# Patient Record
Sex: Male | Born: 1960 | Race: White | Hispanic: No | Marital: Married | Smoking: Current some day smoker
Health system: Southern US, Community
[De-identification: ages and names within clinical notes are randomized; demographics above are authoritative.]

## PROBLEM LIST (undated history)

## (undated) DIAGNOSIS — R911 Solitary pulmonary nodule: Secondary | ICD-10-CM

## (undated) DIAGNOSIS — N201 Calculus of ureter: Secondary | ICD-10-CM

## (undated) DIAGNOSIS — J302 Other seasonal allergic rhinitis: Secondary | ICD-10-CM

## (undated) DIAGNOSIS — K635 Polyp of colon: Secondary | ICD-10-CM

## (undated) DIAGNOSIS — R3912 Poor urinary stream: Secondary | ICD-10-CM

## (undated) DIAGNOSIS — K601 Chronic anal fissure: Secondary | ICD-10-CM

## (undated) DIAGNOSIS — Z87442 Personal history of urinary calculi: Secondary | ICD-10-CM

## (undated) DIAGNOSIS — I4510 Unspecified right bundle-branch block: Secondary | ICD-10-CM

## (undated) DIAGNOSIS — E785 Hyperlipidemia, unspecified: Secondary | ICD-10-CM

## (undated) DIAGNOSIS — Z86010 Personal history of colonic polyps: Secondary | ICD-10-CM

## (undated) DIAGNOSIS — I700 Atherosclerosis of aorta: Secondary | ICD-10-CM

## (undated) DIAGNOSIS — I10 Essential (primary) hypertension: Secondary | ICD-10-CM

## (undated) DIAGNOSIS — K602 Anal fissure, unspecified: Secondary | ICD-10-CM

## (undated) DIAGNOSIS — G4730 Sleep apnea, unspecified: Secondary | ICD-10-CM

## (undated) DIAGNOSIS — K649 Unspecified hemorrhoids: Secondary | ICD-10-CM

## (undated) DIAGNOSIS — R0600 Dyspnea, unspecified: Secondary | ICD-10-CM

## (undated) DIAGNOSIS — N200 Calculus of kidney: Secondary | ICD-10-CM

## (undated) DIAGNOSIS — Z973 Presence of spectacles and contact lenses: Secondary | ICD-10-CM

## (undated) DIAGNOSIS — E559 Vitamin D deficiency, unspecified: Secondary | ICD-10-CM

## (undated) DIAGNOSIS — K219 Gastro-esophageal reflux disease without esophagitis: Secondary | ICD-10-CM

## (undated) DIAGNOSIS — Z860101 Personal history of adenomatous and serrated colon polyps: Secondary | ICD-10-CM

## (undated) DIAGNOSIS — K760 Fatty (change of) liver, not elsewhere classified: Secondary | ICD-10-CM

## (undated) DIAGNOSIS — J432 Centrilobular emphysema: Secondary | ICD-10-CM

## (undated) DIAGNOSIS — T7840XA Allergy, unspecified, initial encounter: Secondary | ICD-10-CM

## (undated) DIAGNOSIS — M1990 Unspecified osteoarthritis, unspecified site: Secondary | ICD-10-CM

## (undated) DIAGNOSIS — G4733 Obstructive sleep apnea (adult) (pediatric): Secondary | ICD-10-CM

## (undated) DIAGNOSIS — Z201 Contact with and (suspected) exposure to tuberculosis: Secondary | ICD-10-CM

## (undated) HISTORY — DX: Essential (primary) hypertension: I10

## (undated) HISTORY — DX: Allergy, unspecified, initial encounter: T78.40XA

## (undated) HISTORY — PX: KNEE SURGERY: SHX244

## (undated) HISTORY — DX: Fatty (change of) liver, not elsewhere classified: K76.0

## (undated) HISTORY — DX: Atherosclerosis of aorta: I70.0

## (undated) HISTORY — DX: Sleep apnea, unspecified: G47.30

## (undated) HISTORY — DX: Vitamin D deficiency, unspecified: E55.9

## (undated) HISTORY — DX: Polyp of colon: K63.5

## (undated) HISTORY — PX: WISDOM TOOTH EXTRACTION: SHX21

## (undated) HISTORY — DX: Unspecified hemorrhoids: K64.9

## (undated) HISTORY — DX: Gastro-esophageal reflux disease without esophagitis: K21.9

## (undated) HISTORY — DX: Unspecified osteoarthritis, unspecified site: M19.90

## (undated) HISTORY — DX: Other seasonal allergic rhinitis: J30.2

## (undated) HISTORY — PX: COLONOSCOPY: SHX174

## (undated) HISTORY — DX: Hyperlipidemia, unspecified: E78.5

## (undated) HISTORY — DX: Anal fissure, unspecified: K60.2

## (undated) HISTORY — PX: POLYPECTOMY: SHX149

---

## 1999-01-31 ENCOUNTER — Encounter: Payer: Self-pay | Admitting: Internal Medicine

## 1999-01-31 ENCOUNTER — Ambulatory Visit (HOSPITAL_COMMUNITY): Admission: RE | Admit: 1999-01-31 | Discharge: 1999-01-31 | Payer: Self-pay | Admitting: Internal Medicine

## 2000-04-22 HISTORY — PX: CYSTO: SHX6284

## 2000-09-24 ENCOUNTER — Encounter: Payer: Self-pay | Admitting: Internal Medicine

## 2000-09-24 ENCOUNTER — Ambulatory Visit (HOSPITAL_COMMUNITY): Admission: RE | Admit: 2000-09-24 | Discharge: 2000-09-24 | Payer: Self-pay | Admitting: Internal Medicine

## 2001-05-08 ENCOUNTER — Ambulatory Visit (HOSPITAL_COMMUNITY): Admission: RE | Admit: 2001-05-08 | Discharge: 2001-05-08 | Payer: Self-pay | Admitting: Internal Medicine

## 2001-05-08 ENCOUNTER — Encounter: Payer: Self-pay | Admitting: Internal Medicine

## 2002-07-29 ENCOUNTER — Ambulatory Visit (HOSPITAL_COMMUNITY): Admission: RE | Admit: 2002-07-29 | Discharge: 2002-07-29 | Payer: Self-pay | Admitting: Internal Medicine

## 2002-07-29 ENCOUNTER — Encounter: Payer: Self-pay | Admitting: Internal Medicine

## 2003-04-04 ENCOUNTER — Ambulatory Visit (HOSPITAL_COMMUNITY): Admission: RE | Admit: 2003-04-04 | Discharge: 2003-04-04 | Payer: Self-pay | Admitting: Infectious Diseases

## 2006-01-02 ENCOUNTER — Ambulatory Visit (HOSPITAL_COMMUNITY): Admission: RE | Admit: 2006-01-02 | Discharge: 2006-01-02 | Payer: Self-pay | Admitting: Internal Medicine

## 2007-02-09 ENCOUNTER — Ambulatory Visit (HOSPITAL_COMMUNITY): Admission: RE | Admit: 2007-02-09 | Discharge: 2007-02-09 | Payer: Self-pay | Admitting: Internal Medicine

## 2007-03-03 ENCOUNTER — Ambulatory Visit (HOSPITAL_COMMUNITY): Admission: RE | Admit: 2007-03-03 | Discharge: 2007-03-03 | Payer: Self-pay | Admitting: Internal Medicine

## 2007-09-14 ENCOUNTER — Emergency Department (HOSPITAL_COMMUNITY): Admission: EM | Admit: 2007-09-14 | Discharge: 2007-09-14 | Payer: Self-pay | Admitting: Family Medicine

## 2007-11-23 ENCOUNTER — Emergency Department (HOSPITAL_COMMUNITY): Admission: EM | Admit: 2007-11-23 | Discharge: 2007-11-23 | Payer: Self-pay | Admitting: Emergency Medicine

## 2008-03-21 ENCOUNTER — Ambulatory Visit (HOSPITAL_COMMUNITY): Admission: RE | Admit: 2008-03-21 | Discharge: 2008-03-21 | Payer: Self-pay | Admitting: Internal Medicine

## 2009-03-29 ENCOUNTER — Ambulatory Visit (HOSPITAL_COMMUNITY): Admission: RE | Admit: 2009-03-29 | Discharge: 2009-03-29 | Payer: Self-pay | Admitting: Internal Medicine

## 2010-04-03 ENCOUNTER — Ambulatory Visit (HOSPITAL_COMMUNITY)
Admission: RE | Admit: 2010-04-03 | Discharge: 2010-04-03 | Payer: Self-pay | Source: Home / Self Care | Attending: Internal Medicine | Admitting: Internal Medicine

## 2011-01-16 LAB — POCT I-STAT, CHEM 8
BUN: 15
Creatinine, Ser: 1
Glucose, Bld: 100 — ABNORMAL HIGH
Hemoglobin: 18.7 — ABNORMAL HIGH
Potassium: 4.3
Sodium: 139
TCO2: 26

## 2011-01-16 LAB — URINE CULTURE: Culture: NO GROWTH

## 2011-01-16 LAB — CBC
MCV: 92
Platelets: 196
RDW: 12.6
WBC: 10.2

## 2011-01-16 LAB — PSA: PSA: 0.62

## 2011-01-16 LAB — POCT URINALYSIS DIP (DEVICE)
Nitrite: NEGATIVE
Urobilinogen, UA: 1

## 2011-01-16 LAB — DIFFERENTIAL
Eosinophils Relative: 2
Lymphocytes Relative: 27
Monocytes Absolute: 0.6
Neutrophils Relative %: 64

## 2011-01-16 LAB — HEPATIC FUNCTION PANEL
AST: 30
Albumin: 4.5

## 2011-04-05 ENCOUNTER — Other Ambulatory Visit (HOSPITAL_COMMUNITY): Payer: Self-pay | Admitting: Internal Medicine

## 2011-04-05 ENCOUNTER — Ambulatory Visit (HOSPITAL_COMMUNITY)
Admission: RE | Admit: 2011-04-05 | Discharge: 2011-04-05 | Disposition: A | Discharge status: Home / Self Care | Payer: 59 | Source: Ambulatory Visit | Attending: Internal Medicine | Admitting: Internal Medicine

## 2011-04-05 DIAGNOSIS — I10 Essential (primary) hypertension: Secondary | ICD-10-CM | POA: Insufficient documentation

## 2011-04-05 DIAGNOSIS — F17200 Nicotine dependence, unspecified, uncomplicated: Secondary | ICD-10-CM | POA: Insufficient documentation

## 2011-04-05 DIAGNOSIS — Z0000 Encounter for general adult medical examination without abnormal findings: Secondary | ICD-10-CM | POA: Insufficient documentation

## 2012-05-12 ENCOUNTER — Ambulatory Visit (HOSPITAL_COMMUNITY)
Admission: RE | Admit: 2012-05-12 | Discharge: 2012-05-12 | Disposition: A | Discharge status: Home / Self Care | Payer: 59 | Source: Ambulatory Visit | Attending: Internal Medicine | Admitting: Internal Medicine

## 2012-05-12 ENCOUNTER — Other Ambulatory Visit (HOSPITAL_COMMUNITY): Payer: Self-pay | Admitting: Internal Medicine

## 2012-05-12 DIAGNOSIS — I10 Essential (primary) hypertension: Secondary | ICD-10-CM

## 2013-05-17 DIAGNOSIS — E559 Vitamin D deficiency, unspecified: Secondary | ICD-10-CM | POA: Insufficient documentation

## 2013-05-17 DIAGNOSIS — E785 Hyperlipidemia, unspecified: Secondary | ICD-10-CM | POA: Insufficient documentation

## 2013-05-18 ENCOUNTER — Encounter: Payer: Self-pay | Admitting: Internal Medicine

## 2013-05-18 ENCOUNTER — Ambulatory Visit (INDEPENDENT_AMBULATORY_CARE_PROVIDER_SITE_OTHER): Payer: 59 | Admitting: Internal Medicine

## 2013-05-18 VITALS — BP 134/84 | HR 84 | Temp 97.9°F | Resp 16 | Ht 69.0 in | Wt 232.0 lb

## 2013-05-18 DIAGNOSIS — E559 Vitamin D deficiency, unspecified: Secondary | ICD-10-CM

## 2013-05-18 DIAGNOSIS — R740 Nonspecific elevation of levels of transaminase and lactic acid dehydrogenase [LDH]: Secondary | ICD-10-CM

## 2013-05-18 DIAGNOSIS — R7309 Other abnormal glucose: Secondary | ICD-10-CM

## 2013-05-18 DIAGNOSIS — Z113 Encounter for screening for infections with a predominantly sexual mode of transmission: Secondary | ICD-10-CM

## 2013-05-18 DIAGNOSIS — R209 Unspecified disturbances of skin sensation: Secondary | ICD-10-CM

## 2013-05-18 DIAGNOSIS — Z1212 Encounter for screening for malignant neoplasm of rectum: Secondary | ICD-10-CM

## 2013-05-18 DIAGNOSIS — R7401 Elevation of levels of liver transaminase levels: Secondary | ICD-10-CM

## 2013-05-18 DIAGNOSIS — Z125 Encounter for screening for malignant neoplasm of prostate: Secondary | ICD-10-CM

## 2013-05-18 DIAGNOSIS — I10 Essential (primary) hypertension: Secondary | ICD-10-CM

## 2013-05-18 DIAGNOSIS — Z0000 Encounter for general adult medical examination without abnormal findings: Secondary | ICD-10-CM

## 2013-05-18 LAB — CBC WITH DIFFERENTIAL/PLATELET
BASOS ABS: 0.1 10*3/uL (ref 0.0–0.1)
Basophils Relative: 1 % (ref 0–1)
Eosinophils Absolute: 0.2 10*3/uL (ref 0.0–0.7)
Eosinophils Relative: 3 % (ref 0–5)
HEMATOCRIT: 48.1 % (ref 39.0–52.0)
Hemoglobin: 17.2 g/dL — ABNORMAL HIGH (ref 13.0–17.0)
LYMPHS PCT: 35 % (ref 12–46)
Lymphs Abs: 3 10*3/uL (ref 0.7–4.0)
MCH: 31.6 pg (ref 26.0–34.0)
MCHC: 35.8 g/dL (ref 30.0–36.0)
MCV: 88.4 fL (ref 78.0–100.0)
Monocytes Absolute: 0.8 10*3/uL (ref 0.1–1.0)
Monocytes Relative: 9 % (ref 3–12)
NEUTROS ABS: 4.5 10*3/uL (ref 1.7–7.7)
NEUTROS PCT: 52 % (ref 43–77)
Platelets: 245 10*3/uL (ref 150–400)
RBC: 5.44 MIL/uL (ref 4.22–5.81)
RDW: 13 % (ref 11.5–15.5)
WBC: 8.5 10*3/uL (ref 4.0–10.5)

## 2013-05-18 LAB — HEMOGLOBIN A1C
HEMOGLOBIN A1C: 5.5 % (ref <5.7)
Mean Plasma Glucose: 111 mg/dL (ref <117)

## 2013-05-18 NOTE — Patient Instructions (Signed)

## 2013-05-18 NOTE — Progress Notes (Signed)
Patient ID: Javier Vega, male   DOB: [birth year], 53 y.o.   MRN: [id number]  Annual Screening Comprehensive Examination  This very nice 53 y.o.  MWM presents for complete physical.  Patient has been followed for HTN, Diabetes  Prediabetes, Hyperlipidemia, and Vitamin D Deficiency. He is RH and reports recent intermittent to constant circumferential numb paresthesias of thew LUE from the elbow to the tips of all fingers and thumb. He deniesany other neuro Sx's.   HTN predates since 2007. Patient's BP has been controlled at home.Today's BP: 134/84 mmHg. Patient denies any cardiac symptoms as chest pain, palpitations, shortness of breath, dizziness or ankle swelling.   Patient's hyperlipidemia is controlled with diet and medications. Patient denies myalgias or other medication SE's. Last cholesterol last visit was 231, triglycerides 246, HDL 38 and LDL 144 and he was prescribed Zetia, but instead he stopped it in July 2014 and opted to try fish oil to see it it would help his cholesterol.   Patient has prediabetes with A1c 5.7% in 2010 with last A1c 5.3% in July 2014. He does have morbid obesity with BMI 35. Patient denies reactive hypoglycemic symptoms, visual blurring, diabetic polys, or paresthesias.    Finally, patient has history of Vitamin D Deficiency of 19 in 2008 with last vitamin D 64 in July 2014.       Medication List       aspirin 81 MG chewable tablet  Chew 81 mg by mouth daily.     FISH OIL PO  Take by mouth daily.     MENS MULTIVITAMIN PLUS Tabs  Take by mouth daily.     VITAMIN B 12 PO  Take by mouth daily.     VITAMIN C PO  Take by mouth daily.     VITAMIN D PO  Take 8,000 Int'l Units by mouth daily.        Allergies  Allergen Reactions  . Crestor [Rosuvastatin]     Past Medical History  Diagnosis Date  . Hyperlipidemia   . Hypertension   . Vitamin D deficiency     Past Surgical History  Procedure Laterality Date  . Carpal tunnel release  2002  .  Cysto  2002    Family History  Problem Relation Age of Onset  . Adopted: Yes    History   Social History  . Marital Status: Married    Spouse Name: N/A    Number of Children: N/A  . Years of Education: N/A   Occupational History  . Not on file.   Social History Main Topics  . Smoking status: Current Every Day Smoker  . Smokeless tobacco: Not on file     Comment: smokes 5 cigarettes daily  . Alcohol Use: Yes     Comment: Occ  . Drug Use: No  . Sexual Activity: Not on file   Other Topics Concern  . Not on file   Social History Narrative  . No narrative on file    ROS Constitutional: Denies fever, chills, weight loss/gain, headaches, insomnia, fatigue, night sweats, and change in appetite. Eyes: Denies redness, blurred vision, diplopia, discharge, itchy, watery eyes.  ENT: Denies discharge, congestion, post nasal drip, epistaxis, sore throat, earache, hearing loss, dental pain, Tinnitus, Vertigo, Sinus pain, snoring.  Cardio: Denies chest pain, palpitations, irregular heartbeat, syncope, dyspnea, diaphoresis, orthopnea, PND, claudication, edema Respiratory: denies cough, dyspnea, DOE, pleurisy, hoarseness, laryngitis, wheezing.  Gastrointestinal: Denies dysphagia, heartburn, reflux, water brash, pain, cramps, nausea, vomiting, bloating, diarrhea, constipation, hematemesis,  melena, hematochezia, jaundice, hemorrhoids Genitourinary: Denies dysuria, frequency, urgency, nocturia, hesitancy, discharge, hematuria, flank pain Musculoskeletal: Denies arthralgia, myalgia, stiffness, Jt. Swelling, pain, limp, and strain/sprain. Skin: Denies puritis, rash, hives, warts, acne, eczema, changing in skin lesion Neuro: No weakness, tremor, incoordination, spasms, paresthesia, pain Psychiatric: Denies confusion, memory loss, sensory loss Endocrine: Denies change in weight, skin, hair change, nocturia, and paresthesia, diabetic polys, visual blurring, hyper / hypo glycemic episodes.   Heme/Lymph: No excessive bleeding, bruising, or elarged lymph nodes.  BP: 134/84  Pulse: 84  Temp: 97.9 F (36.6 C)  Resp: 16    Estimated body mass index is 34.24 kg/(m^2) as calculated from the following:   Height as of this encounter: 5\' 9"  (1.753 m).   Weight as of this encounter: 232 lb (105.235 kg).  Physical Exam General Appearance: Well nourished, in no apparent distress. Eyes: PERRLA, EOMs, conjunctiva no swelling or erythema, normal fundi and vessels. Sinuses: No frontal/maxillary tenderness ENT/Mouth: EACs patent / TMs  nl. Nares clear without erythema, swelling, mucoid exudates. Oral hygiene is good. No erythema, swelling, or exudate. Tongue normal, non-obstructing. Tonsils not swollen or erythematous. Hearing normal.  Neck: Supple, thyroid normal. No bruits, nodes or JVD. Respiratory: Respiratory effort normal.  BS equal and clear bilateral without rales, rhonci, wheezing or stridor. Cardio: Heart sounds are normal with regular rate and rhythm and no murmurs, rubs or gallops. Peripheral pulses are normal and equal bilaterally without edema. No aortic or femoral bruits. Chest: symmetric with normal excursions and percussion.  Abdomen: Flat, soft, with bowl sounds. Nontender, no guarding, rebound, hernias, masses, or organomegaly.  Lymphatics: Non tender without lymphadenopathy.  Genitourinary: No hernias.Testes nl. DRE - prostate nl for age - smooth & firm w/o nodules. Musculoskeletal: Full ROM all peripheral extremities, joint stability, 5/5 strength, and normal gait. Skin: Warm and dry without rashes, lesions, cyanosis, clubbing or  ecchymosis.  Neuro: Cranial nerves intact, reflexes equal bilaterally. Normal muscle tone, no cerebellar symptoms. Sensation intact.  Pysch: Awake and oriented X 3, normal affect, insight and judgment appropriate. Normal ROM of the LUE with sensory intact at present.  Assessment and Plan  1. Annual Screening Examination 2. Hypertension   3. Hyperlipidemia 4. Pre Diabetes 5. Vitamin D Deficiency 6. Query paresthesias of LUE - R/O Rt parietal lobe lesion, etc. Plant CTs Brain   Continue prudent diet as discussed, weight control, BP monitoring, regular exercise, and medications as discussed.  Discussed med effects and SE's. Routine screening labs and tests as requested with regular follow-up as recommended.

## 2013-05-19 LAB — HEPATITIS B SURFACE ANTIBODY,QUALITATIVE: HEP B S AB: NEGATIVE

## 2013-05-19 LAB — URINALYSIS, MICROSCOPIC ONLY
BACTERIA UA: NONE SEEN
CASTS: NONE SEEN
CRYSTALS: NONE SEEN
SQUAMOUS EPITHELIAL / LPF: NONE SEEN

## 2013-05-19 LAB — HEPATIC FUNCTION PANEL
ALBUMIN: 4.6 g/dL (ref 3.5–5.2)
ALT: 28 U/L (ref 0–53)
AST: 26 U/L (ref 0–37)
Alkaline Phosphatase: 76 U/L (ref 39–117)
BILIRUBIN TOTAL: 0.4 mg/dL (ref 0.3–1.2)
Bilirubin, Direct: 0.1 mg/dL (ref 0.0–0.3)
Indirect Bilirubin: 0.3 mg/dL (ref 0.0–0.9)
TOTAL PROTEIN: 7.3 g/dL (ref 6.0–8.3)

## 2013-05-19 LAB — MICROALBUMIN / CREATININE URINE RATIO
Creatinine, Urine: 133.8 mg/dL
MICROALB UR: 0.59 mg/dL (ref 0.00–1.89)
Microalb Creat Ratio: 4.4 mg/g (ref 0.0–30.0)

## 2013-05-19 LAB — HEPATITIS A ANTIBODY, TOTAL: HEP A TOTAL AB: NONREACTIVE

## 2013-05-19 LAB — HIV ANTIBODY (ROUTINE TESTING W REFLEX): HIV: NONREACTIVE

## 2013-05-19 LAB — LIPID PANEL
Cholesterol: 200 mg/dL (ref 0–200)
HDL: 39 mg/dL — ABNORMAL LOW (ref >39)
LDL Cholesterol: 114 mg/dL — ABNORMAL HIGH (ref 0–99)
Total CHOL/HDL Ratio: 5.1 Ratio
Triglycerides: 235 mg/dL — ABNORMAL HIGH (ref <150)
VLDL: 47 mg/dL — AB (ref 0–40)

## 2013-05-19 LAB — PSA: PSA: 0.7 ng/mL (ref <=4.00)

## 2013-05-19 LAB — INSULIN, FASTING: Insulin fasting, serum: 25 u[IU]/mL (ref 3–28)

## 2013-05-19 LAB — HEPATITIS C ANTIBODY: HCV AB: NEGATIVE

## 2013-05-19 LAB — HEPATITIS B CORE ANTIBODY, TOTAL: Hep B Core Total Ab: NONREACTIVE

## 2013-05-19 LAB — VITAMIN B12: Vitamin B-12: 489 pg/mL (ref 211–911)

## 2013-05-19 LAB — RPR

## 2013-05-19 LAB — TESTOSTERONE: Testosterone: 375 ng/dL (ref 300–890)

## 2013-05-19 LAB — TSH: TSH: 1.872 u[IU]/mL (ref 0.350–4.500)

## 2013-05-19 LAB — VITAMIN D 25 HYDROXY (VIT D DEFICIENCY, FRACTURES): Vit D, 25-Hydroxy: 53 ng/mL (ref 30–89)

## 2013-05-19 LAB — MAGNESIUM: Magnesium: 2.1 mg/dL (ref 1.5–2.5)

## 2013-05-20 LAB — HEPATITIS B E ANTIBODY: Hepatitis Be Antibody: NEGATIVE

## 2013-05-27 ENCOUNTER — Other Ambulatory Visit: Payer: 59

## 2013-06-01 ENCOUNTER — Encounter: Payer: Self-pay | Admitting: Physician Assistant

## 2013-06-01 ENCOUNTER — Ambulatory Visit (INDEPENDENT_AMBULATORY_CARE_PROVIDER_SITE_OTHER): Payer: 59 | Admitting: Physician Assistant

## 2013-06-01 VITALS — BP 128/82 | HR 88 | Temp 98.1°F | Resp 16 | Ht 69.0 in | Wt 233.0 lb

## 2013-06-01 DIAGNOSIS — J441 Chronic obstructive pulmonary disease with (acute) exacerbation: Secondary | ICD-10-CM

## 2013-06-01 DIAGNOSIS — F17200 Nicotine dependence, unspecified, uncomplicated: Secondary | ICD-10-CM

## 2013-06-01 MED ORDER — PREDNISONE 20 MG PO TABS: ORAL_TABLET | ORAL | Status: DC

## 2013-06-01 MED ORDER — PROMETHAZINE-CODEINE 6.25-10 MG/5ML PO SYRP: 5.0000 mL | ORAL_SOLUTION | Freq: Four times a day (QID) | ORAL | Status: DC | PRN

## 2013-06-01 MED ORDER — AZITHROMYCIN 250 MG PO TABS: ORAL_TABLET | ORAL | Status: AC

## 2013-06-01 NOTE — Progress Notes (Signed)
   Subjective:    Patient ID: Javier Vega, male    DOB: 11/17/1960, 53 y.o.   MRN: [id number]  Sinus Problem This is a new problem. The current episode started in the past 7 days. The problem has been gradually worsening since onset. There has been no fever. Associated symptoms include congestion, coughing and sinus pressure. Pertinent negatives include no chills, diaphoresis, ear pain, headaches, hoarse voice, neck pain, shortness of breath, sneezing, sore throat or swollen glands. Past treatments include oral decongestants and acetaminophen. The treatment provided no relief.      Review of Systems  Constitutional: Positive for fatigue. Negative for fever, chills and diaphoresis.  HENT: Positive for congestion, postnasal drip, rhinorrhea and sinus pressure. Negative for ear pain, hoarse voice, sneezing and sore throat.   Eyes: Negative.   Respiratory: Positive for cough. Negative for chest tightness, shortness of breath and wheezing.   Cardiovascular: Negative.   Gastrointestinal: Negative.   Genitourinary: Negative.   Musculoskeletal: Negative.  Negative for neck pain.  Neurological: Negative.  Negative for headaches.       Objective:   Physical Exam  Constitutional: He is oriented to person, place, and time. He appears well-developed and well-nourished.  HENT:  Head: Normocephalic and atraumatic.  Right Ear: External ear normal.  Left Ear: External ear normal.  Nose: Nose normal.  Mouth/Throat: Oropharynx is clear and moist.  Eyes: Conjunctivae are normal. Pupils are equal, round, and reactive to light.  Neck: Normal range of motion. Neck supple.  Cardiovascular: Normal rate, regular rhythm and normal heart sounds.   No murmur heard. Pulmonary/Chest: Effort normal. No respiratory distress. He has wheezes. He has no rales. He exhibits no tenderness.  Abdominal: Soft. Bowel sounds are normal.  Lymphadenopathy:    He has no cervical adenopathy.  Neurological: He is alert and  oriented to person, place, and time.  Skin: Skin is warm and dry.      Assessment & Plan:  COPD exacerbation-  Smoking cessation counseled- declines chantix  Zpak  Prednisone  Allegra  Increase water

## 2013-06-01 NOTE — Patient Instructions (Signed)
Please take the prednisone to help decrease inflammation and therefore decrease symptoms. Take it it with food to avoid GI upset. It can cause increased energy but on the other hand it can make it hard to sleep at night so please take it in the morning.  It is not an antibiotic so you can stop it early if you are feeling better.  If you are diabetic it will increase your sugars.   The majority of colds are caused by viruses and do not require antibiotics. Please read the rest of this hand out to learn more about the common cold and what you can do to help yourself as well as help prevent the over use of antibiotics.   COMMON COLD SIGNS AND SYMPTOMS - The common cold usually causes nasal congestion, runny nose, and sneezing. A sore throat may be present on the first day but usually resolves quickly. If a cough occurs, it generally develops on about the fourth or fifth day of symptoms, typically when congestion and runny nose are resolving  COMMON COLD COMPLICATIONS - In most cases, colds do not cause serious illness or complications. Most colds last for three to seven days, although many people continue to have symptoms (coughing, sneezing, congestion) for up to two weeks.  One of the more common complications is sinusitis, which is usually caused by viruses and rarely (about 2 percent of the time) by bacteria. Having thick or yellow to green-colored nasal discharge does not mean that bacterial sinusitis has developed; discolored nasal discharge is a normal phase of the common cold.  Lower respiratory infections, such as pneumonia or bronchitis, may develop following a cold.  Infection of the middle ear, or otitis media, can accompany or follow a cold.  COMMON COLD TREATMENT - There is no specific treatment for the viruses that cause the common cold. Most treatments are aimed at relieving some of the symptoms of the cold, but do not shorten or cure the cold. Antibiotics are not useful for treating the  common cold; antibiotics are only used to treat illnesses caused by bacteria, not viruses. Unnecessary use of antibiotics for the treatment of the common cold can cause allergic reactions, diarrhea, or other gastrointestinal symptoms in some patients.  The symptoms of a cold will resolve over time, even without any treatment. People with underlying medical conditions and those who use other over-the-counter or prescription medications should speak with their healthcare provider or pharmacist to ensure that it is safe to use these treatments. The following are treatments that may reduce the symptoms caused by the common cold.  Nasal congestion - Decongestants are good for nasal congestion- if you feel very stuffy but no mucus is coming out, this is the medication that will help you the most.  Pseudoephedrine is a decongestant that can improve nasal congestion. Although a prescription is not required, drugstores in the [place] keep pseudoephedrine behind the counter, so it must be requested from a pharmacist. If you have a heart condition or high blood pressure please use Coricidin BPH instead.   Runny nose - Antihistamines such as diphenhydramine (Benadryl), certazine (Zyrtec) which are best taking at night because they can make you tired OR loratadine (Claritin),  fexafinadine (Allegra) help with a runny nose.   Nasal sprays such an oxymetazoline (Afrin and others) may also give temporary relief of nasal congestion. However, these sprays should never be used for more than two to three days; use for more than three days use can worsen congestion.    Nasocort is now over the counter and can help decrease a runny nose. Please stop the medication if you have blurry vision or nose bleeds.   Sore throat and headache - Sore throat and headache are best treated with a mild pain reliever such as acetaminophen (Tylenol) or a non-steroidal anti-inflammatory agent such as ibuprofen or naproxen (Motrin or Aleve).  These medications should be taken with food to prevent stomach problems. As well as gargling with warm water and salt.   Cough - Common cough medicine ingredients include guaifenesin and dextromethorphan; these are often combined with other medications in over-the-counter cold formulas. Often a cough is worse at night or first in the morning due to post nasal drip from you nose. You can try to sleep at an angle to decrease a cough.   Alternative treatments - Heated, humidified air can improve symptoms of nasal congestion and runny nose, and causes few to no side effects. A number of alternative products, including vitamin C, doubling up on your vitamin D and herbal products such as echinacea, may help. Certain products, such as nasal gels that contain zinc (eg, Zicam), have been associated with a permanent loss of smell.  Antibiotics - Antibiotics should not be used to treat an uncomplicated common cold. As noted above, colds are caused by viruses. Antibiotics treat bacterial, not viral infections. Some viruses that cause the common cold can also depress the immune system or cause swelling in the lining of the nose or airways; this can, in turn, lead to a bacterial infection. Often you need to give your body 7 days to fight off a common cold while treating the symptoms with the medications listed above. If after 7 days your symptoms are not improving, you are getting worse, you have shortness of breath, chest pain, a fever of over 103 you should seek medical help immediately.   PREVENTION IS THE BEST MEDICINE - Hand washing is an essential and highly effective way to prevent the spread of infection.  Alcohol-based hand rubs are a good alternative for disinfecting hands if a sink is not available.  Hands should be washed before preparing food and eating and after coughing, blowing the nose, or sneezing. While it is not always possible to limit contact with people who may be infected with a cold, touching  the eyes, nose, or mouth after direct contact should be avoided when possible. Sneezing/coughing into the sleeve of one's clothing (at the inner elbow) is another means of containing sprays of saliva and secretions and does not contaminate the hands.    We are giving you chantix for smoking cessation. You can do it! And we are here to help! You may have heard some scary side effects about chantix, the three most common I hear about are nausea, crazy dreams and depression.  However, I like for my patients to try to stay on 1/2 a tablet twice a day rather than one tablet twice a day as normally prescribed. This helps decrease the chances of side effects and helps save money by making a one month prescription last two months  Please start the prescription this way:  Start 1/2 tablet by mouth once daily after food with a full glass of water for 3 days Then do 1/2 tablet by mouth twice daily for 4 days.  At this point we have several options: 1) continue on 1/2 tablet twice a day- which I encourage you to do. You can stay on this dose the rest of the time  on the medication or if you still feel the need to smoke you can do one of the two options below. 2) do one tablet in the morning and 1/2 in the evening which helps decrease dreams. 3) do one tablet twice a day.   What if I miss a dose? If you miss a dose, take it as soon as you can. If it is almost time for your next dose, take only that dose. Do not take double or extra doses.  What should I watch for while using this medicine? Visit your doctor or health care professional for regular check ups. Ask for ongoing advice and encouragement from your doctor or healthcare professional, friends, and family to help you quit. If you smoke while on this medication, quit again  Your mouth may get dry. Chewing sugarless gum or hard candy, and drinking plenty of water may help. Contact your doctor if the problem does not go away or is severe.  You may get  drowsy or dizzy. Do not drive, use machinery, or do anything that needs mental alertness until you know how this medicine affects you. Do not stand or sit up quickly, especially if you are an older patient.   The use of this medicine may increase the chance of suicidal thoughts or actions. Pay special attention to how you are responding while on this medicine. Any worsening of mood, or thoughts of suicide or dying should be reported to your health care professional right away.  ADVANTAGES OF QUITTING SMOKING  Within 20 minutes, blood pressure decreases. Your pulse is at normal level.  After 8 hours, carbon monoxide levels in the blood return to normal. Your oxygen level increases.  After 24 hours, the chance of having a heart attack starts to decrease. Your breath, hair, and body stop smelling like smoke.  After 48 hours, damaged nerve endings begin to recover. Your sense of taste and smell improve.  After 72 hours, the body is virtually free of nicotine. Your bronchial tubes relax and breathing becomes easier.  After 2 to 12 weeks, lungs can hold more air. Exercise becomes easier and circulation improves.  After 1 year, the risk of coronary heart disease is cut in half.  After 5 years, the risk of stroke falls to the same as a nonsmoker.  After 10 years, the risk of lung cancer is cut in half and the risk of other cancers decreases significantly.  After 15 years, the risk of coronary heart disease drops, usually to the level of a nonsmoker.  You will have extra money to spend on things other than cigarettes.

## 2013-06-07 ENCOUNTER — Other Ambulatory Visit: Payer: 59

## 2013-07-01 ENCOUNTER — Other Ambulatory Visit: Payer: 59

## 2013-07-27 ENCOUNTER — Inpatient Hospital Stay: Admission: RE | Admit: 2013-07-27 | Payer: 59 | Source: Ambulatory Visit

## 2013-08-18 ENCOUNTER — Ambulatory Visit: Payer: Self-pay | Admitting: Physician Assistant

## 2013-08-23 ENCOUNTER — Encounter: Payer: Self-pay | Admitting: Internal Medicine

## 2013-08-26 ENCOUNTER — Telehealth: Payer: Self-pay | Admitting: Internal Medicine

## 2013-08-26 NOTE — Telephone Encounter (Signed)
MISSED APPT WITH ACOLLIER, FATHER, CB Welling PASSED AWAY  Thank you, [facility] [phone]

## 2013-09-03 ENCOUNTER — Encounter: Payer: Self-pay | Admitting: Physician Assistant

## 2013-09-03 ENCOUNTER — Ambulatory Visit (INDEPENDENT_AMBULATORY_CARE_PROVIDER_SITE_OTHER): Payer: 59 | Admitting: Physician Assistant

## 2013-09-03 VITALS — BP 130/78 | HR 80 | Temp 98.0°F | Resp 16 | Wt 229.0 lb

## 2013-09-03 DIAGNOSIS — L237 Allergic contact dermatitis due to plants, except food: Secondary | ICD-10-CM

## 2013-09-03 DIAGNOSIS — L255 Unspecified contact dermatitis due to plants, except food: Secondary | ICD-10-CM

## 2013-09-03 MED ORDER — PREDNISONE 20 MG PO TABS
ORAL_TABLET | ORAL | Status: DC
Start: 2013-09-03 — End: 2013-10-08

## 2013-09-03 MED ORDER — DEXAMETHASONE SODIUM PHOSPHATE 10 MG/ML IJ SOLN
10.0000 mg | Freq: Once | INTRAMUSCULAR | Status: AC
  Administered 2013-09-03: 10 mg via INTRAMUSCULAR

## 2013-09-03 NOTE — Patient Instructions (Signed)
Poison [facility] is an inflammation of the skin (contact dermatitis). It is caused by contact with the allergens on the leaves of the oak (toxicodendron) plants. Depending on your sensitivity, the rash may consist simply of redness and itching, or it may also progress to blisters which may break open (rupture). These must be well cared for to prevent secondary germ (bacterial) infection as these infections can lead to scarring. The eyes may also get puffy. The puffiness is worst in the morning and gets better as the day progresses. Healing is best accomplished by keeping any open areas dry, clean, covered with a bandage, and covered with an antibacterial ointment if needed. Without secondary infection, this dermatitis usually heals without scarring within 2 to 3 weeks without treatment. HOME CARE INSTRUCTIONS When you have been exposed to poison oak, it is very important to thoroughly wash with soap and water as soon as the exposure has been discovered. You have about one half hour to remove the plant resin before it will cause the rash. This cleaning will quickly destroy the oil or antigen on the skin (the antigen is what causes the rash). Wash aggressively under the fingernails as any plant resin still there will continue to spread the rash. Do not rub skin vigorously when washing affected area. Poison oak cannot spread if no oil from the plant remains on your body. Rash that has progressed to weeping sores (lesions) will not spread the rash unless you have not washed thoroughly. It is also important to clean any clothes you have been wearing as they may carry active allergens which will spread the rash, even several days later. Avoidance of the plant in the future is the best measure. Poison oak plants can be recognized by the number of leaves. Generally, poison oak has three leaves with flowering branches on a single stem. Diphenhydramine may be purchased over the counter and used as needed for  itching. Do not drive with this medication if it makes you drowsy. Ask your caregiver about medication for children. SEEK IMMEDIATE MEDICAL CARE IF:   Open areas of the rash develop.  You notice redness extending beyond the area of the rash.  There is a pus like discharge.  There is increased pain.  Other signs of infection develop (such as fever). Document Released: 10/13/2002 Document Revised: 07/01/2011 Document Reviewed: 02/22/2009 [employer] Patient Information 2014 [place], [place].

## 2013-09-03 NOTE — Progress Notes (Signed)
   Subjective:    Patient ID: Javier Vega, male    DOB: [birth year], 53 y.o.   MRN: [id number]  Rash This is a new problem. The current episode started in the past 7 days. The problem has been gradually worsening since onset. Pain location: bilateral arms. The rash is characterized by blistering, itchiness and redness. Associated with: cut down a tree with his son  Past treatments include nothing. The treatment provided no relief.    Review of Systems  Skin: Positive for rash.       Objective:   Physical Exam  Skin: Skin is warm and dry. Rash (bilateral arms with clustered vesicles. ) noted.       Assessment & Plan:  Poison oak dermatitis - Plan: dexamethasone (DECADRON) injection 10 mg

## 2013-10-08 ENCOUNTER — Encounter: Payer: Self-pay | Admitting: Internal Medicine

## 2013-10-08 ENCOUNTER — Ambulatory Visit (INDEPENDENT_AMBULATORY_CARE_PROVIDER_SITE_OTHER): Payer: 59 | Admitting: Internal Medicine

## 2013-10-08 VITALS — BP 112/80 | HR 72 | Temp 98.1°F | Resp 16 | Ht 69.0 in | Wt 232.6 lb

## 2013-10-08 DIAGNOSIS — E785 Hyperlipidemia, unspecified: Secondary | ICD-10-CM

## 2013-10-08 DIAGNOSIS — I10 Essential (primary) hypertension: Secondary | ICD-10-CM

## 2013-10-08 DIAGNOSIS — Z79899 Other long term (current) drug therapy: Secondary | ICD-10-CM | POA: Insufficient documentation

## 2013-10-08 DIAGNOSIS — R7309 Other abnormal glucose: Secondary | ICD-10-CM

## 2013-10-08 DIAGNOSIS — E559 Vitamin D deficiency, unspecified: Secondary | ICD-10-CM

## 2013-10-08 LAB — CBC WITH DIFFERENTIAL/PLATELET
BASOS ABS: 0.1 10*3/uL (ref 0.0–0.1)
Basophils Relative: 1 % (ref 0–1)
EOS ABS: 0.2 10*3/uL (ref 0.0–0.7)
Eosinophils Relative: 2 % (ref 0–5)
HEMATOCRIT: 49.6 % (ref 39.0–52.0)
HEMOGLOBIN: 18 g/dL — AB (ref 13.0–17.0)
Lymphocytes Relative: 37 % (ref 12–46)
Lymphs Abs: 3.1 10*3/uL (ref 0.7–4.0)
MCH: 32.5 pg (ref 26.0–34.0)
MCHC: 36.3 g/dL — ABNORMAL HIGH (ref 30.0–36.0)
MCV: 89.7 fL (ref 78.0–100.0)
MONO ABS: 0.9 10*3/uL (ref 0.1–1.0)
MONOS PCT: 10 % (ref 3–12)
NEUTROS PCT: 50 % (ref 43–77)
Neutro Abs: 4.3 10*3/uL (ref 1.7–7.7)
Platelets: 236 10*3/uL (ref 150–400)
RBC: 5.53 MIL/uL (ref 4.22–5.81)
RDW: 13.7 % (ref 11.5–15.5)
WBC: 8.5 10*3/uL (ref 4.0–10.5)

## 2013-10-08 LAB — HEMOGLOBIN A1C
Hgb A1c MFr Bld: 5.7 % — ABNORMAL HIGH (ref <5.7)
Mean Plasma Glucose: 117 mg/dL — ABNORMAL HIGH (ref <117)

## 2013-10-08 NOTE — Progress Notes (Signed)
Patient ID: Javier Vega, male   DOB: [birth year], 53 y.o.   MRN: [id number]   This very nice 53 y.o.MWM presents for 3 month follow up with Hypertension, Hyperlipidemia, Pre-Diabetes and Vitamin D Deficiency.   HTN predates since 2007. BP has been controlled at home. Today's BP: 112/80 mmHg. Patient denies any cardiac type chest pain, palpitations, dyspnea/orthopnea/PND, dizziness, claudication, or dependent edema.  Hyperlipidemia is not controlled with diet. Last Cholesterol was 200, Triglycerides were 235, HDL 39 and LDL 114 - in Feb 2015 - not at goal. Patient denies myalgias or other med SE's.   Also, the patient has history of PreDiabetes since Dec 2010 and he had an  A1c of 5.8% in Dec 2012 with an Elevated insulin 79 consistent with Insulin Resistance. His last A1c was still 5.8% in Feb 2015. Patient denies any symptoms of reactive hypoglycemia, diabetic polys, paresthesias or visual blurring.  Further, Patient has history of Vitamin D Deficiency with last vitamin D of 53 in Feb 2015. Patient supplements vitamin D without any suspected side-effects.   Medication List   aspirin 81 MG chewable tablet  Chew 81 mg by mouth daily.     FISH OIL PO  Take by mouth 3 (three) times daily.     MENS MULTIVITAMIN PLUS Tabs  Take by mouth daily.     VITAMIN B 12 PO  Take by mouth daily.     VITAMIN C PO  Take by mouth daily.     VITAMIN D PO  Take 8,000 Int'l Units by mouth daily.     Allergies  Allergen Reactions  . Crestor [Rosuvastatin]    PMHx:   Past Medical History  Diagnosis Date  . Hyperlipidemia   . Hypertension   . Vitamin D deficiency    FHx:    Reviewed / unchanged  SHx:    Reviewed / unchanged   Systems Review: Constitutional: Denies fever, chills, wt changes, headaches, insomnia, fatigue, night sweats, change in appetite. Eyes: Denies redness, blurred vision, diplopia, discharge, itchy, watery eyes.  ENT: Denies discharge, congestion, post nasal drip, epistaxis,  sore throat, earache, hearing loss, dental pain, tinnitus, vertigo, sinus pain, snoring.  CV: Denies chest pain, palpitations, irregular heartbeat, syncope, dyspnea, diaphoresis, orthopnea, PND, claudication or edema. Respiratory: denies cough, dyspnea, DOE, pleurisy, hoarseness, laryngitis, wheezing.  Gastrointestinal: Denies dysphagia, odynophagia, heartburn, reflux, water brash, abdominal pain or cramps, nausea, vomiting, bloating, diarrhea, constipation, hematemesis, melena, hematochezia  or hemorrhoids. Genitourinary: Denies dysuria, frequency, urgency, nocturia, hesitancy, discharge, hematuria or flank pain. Musculoskeletal: Denies arthralgias, myalgias, stiffness, jt. swelling, pain, limping or strain/sprain.  Skin: Denies pruritus, rash, hives, warts, acne, eczema or change in skin lesion(s). Neuro: No weakness, tremor, incoordination, spasms, paresthesia or pain. Psychiatric: Denies confusion, memory loss or sensory loss. Endo: Denies change in weight, skin or hair change.  Heme/Lymph: No excessive bleeding, bruising or enlarged lymph nodes.  Exam:  BP 112/80  Pulse 72  Temp 98.1 F  Resp 16  Ht 5\' 9"    Wt 232 lb 9.6 oz   BMI 34.33 kg/m2  Appears well nourished - in no distress. Eyes: PERRLA, EOMs, conjunctiva no swelling or erythema. Sinuses: No frontal/maxillary tenderness ENT/Mouth: EAC's clear, TM's nl w/o erythema, bulging. Nares clear w/o erythema, swelling, exudates. Oropharynx clear without erythema or exudates. Oral hygiene is good. Tongue normal, non obstructing. Hearing intact.  Neck: Supple. Thyroid nl. Car 2+/2+ without bruits, nodes or JVD. Chest: Respirations nl with BS clear & equal w/o rales, rhonchi, wheezing or  stridor.  Cor: Heart sounds normal w/ regular rate and rhythm without sig. murmurs, gallops, clicks, or rubs. Peripheral pulses normal and equal  without edema.  Abdomen: Soft & bowel sounds normal. Non-tender w/o guarding, rebound, hernias, masses, or  organomegaly.  Lymphatics: Unremarkable.  Musculoskeletal: Full ROM all peripheral extremities, joint stability, 5/5 strength, and normal gait.  Skin: Warm, dry without exposed rashes, lesions or ecchymosis apparent.  Neuro: Cranial nerves intact, reflexes equal bilaterally. Sensory-motor testing grossly intact. Tendon reflexes grossly intact.  Pysch: Alert & oriented x 3. Insight and judgement nl & appropriate. No ideations.  Assessment and Plan:  1. Hypertension - Continue monitor blood pressure at home. Continue diet/meds same.  2. Hyperlipidemia - Continue stricter diet and possibly will need meds; Also, exercise,& lifestyle modifications. Continue monitor periodic cholesterol/liver & renal functions   3. Pre-diabetes/Insulin Resistance - Continue diet, weightloss, exercise, lifestyle modifications. Monitor appropriate labs.  4. Vitamin D Deficiency - Continue supplementation.  Recommended regular exercise, BP monitoring, weight control, and discussed med and SE's. Recommended labs to assess and monitor clinical status. Further disposition pending results of labs.

## 2013-10-08 NOTE — Patient Instructions (Signed)
Recommend the book "the END of DIETING" by Dr Joel Furman   Get the  Book "The END of DIABETES " by Dr Joel Fuhrman  At [employer] - get book & Audio CD's      Being diabetic has a  300% increased risk for heart attack, stroke, cancer, and alzheimer- type vascular dementia. It is very important that you work harder with diet by avoiding all foods that are white except chicken & fish. Avoid white rice (brown & wild rice is OK), white potatoes (sweetpotatoes in moderation is OK), White bread or wheat bread or anything made out of white flour like bagels, donuts, rolls, buns, biscuits, cakes, pastries, cookies, pizza crust, and pasta (made from white flour & egg whites) - vegetarian pasta or spinach or wheat pasta is OK. Multigrain breads like Arnold's or Pepper[employer], or multigrain sandwich thins or flatbreads.  Diet, exercise and weight loss can reverse and cure diabetes in the early stages.  Diet, exercise and weight loss is very important in the control and prevention of complications of diabetes which affects every system in your body, ie. Brain - dementia/stroke, eyes - glaucoma/blindness, heart - heart attack/heart failure, kidneys - dialysis, stomach - gastric paralysis, intestines - malabsorption, nerves - severe painful neuritis, circulation - gangrene & loss of a leg(s), and finally cancer and Alzheimers.    I recommend avoid fried & greasy foods,  sweets/candy, white rice (brown or wild rice or Quinoa is OK), white potatoes (sweet potatoes are OK) - anything made from white flour - bagels, doughnuts, rolls, buns, biscuits,white and wheat breads, pizza crust and traditional pasta made of white flour & egg white(vegetarian pasta or spinach or wheat pasta is OK).  Multi-grain bread is OK - like multi-grain flat bread or sandwich thins. Avoid alcohol in excess. Exercise is also important.    Eat all the vegetables you want - avoid meat, especially red meat and dairy - especially cheese.  Cheese is  the most concentrated form of trans-fats which is the worst thing to clog up our arteries. Veggie cheese is OK which can be found in the fresh produce section at [employer] or [employer] or [employer]   

## 2013-10-09 LAB — HEPATIC FUNCTION PANEL
ALBUMIN: 4.4 g/dL (ref 3.5–5.2)
ALT: 24 U/L (ref 0–53)
AST: 23 U/L (ref 0–37)
Alkaline Phosphatase: 72 U/L (ref 39–117)
Bilirubin, Direct: 0.1 mg/dL (ref 0.0–0.3)
Indirect Bilirubin: 0.4 mg/dL (ref 0.2–1.2)
TOTAL PROTEIN: 7.1 g/dL (ref 6.0–8.3)
Total Bilirubin: 0.5 mg/dL (ref 0.2–1.2)

## 2013-10-09 LAB — LIPID PANEL
Cholesterol: 224 mg/dL — ABNORMAL HIGH (ref 0–200)
HDL: 37 mg/dL — ABNORMAL LOW (ref >39)
LDL CALC: 128 mg/dL — AB (ref 0–99)
Total CHOL/HDL Ratio: 6.1 Ratio
Triglycerides: 294 mg/dL — ABNORMAL HIGH (ref <150)
VLDL: 59 mg/dL — ABNORMAL HIGH (ref 0–40)

## 2013-10-09 LAB — BASIC METABOLIC PANEL WITH GFR
BUN: 17 mg/dL (ref 6–23)
CALCIUM: 9.7 mg/dL (ref 8.4–10.5)
CO2: 23 mEq/L (ref 19–32)
CREATININE: 0.8 mg/dL (ref 0.50–1.35)
Chloride: 103 mEq/L (ref 96–112)
GFR, Est African American: >89 mL/min
GFR, Est Non African American: >89 mL/min
GLUCOSE: 86 mg/dL (ref 70–99)
Potassium: 4 mEq/L (ref 3.5–5.3)
Sodium: 137 mEq/L (ref 135–145)

## 2013-10-09 LAB — INSULIN, FASTING: INSULIN FASTING, SERUM: 18 u[IU]/mL (ref 3–28)

## 2013-10-09 LAB — TSH: TSH: 1.302 u[IU]/mL (ref 0.350–4.500)

## 2013-10-09 LAB — MAGNESIUM: Magnesium: 1.8 mg/dL (ref 1.5–2.5)

## 2013-10-09 LAB — VITAMIN D 25 HYDROXY (VIT D DEFICIENCY, FRACTURES): Vit D, 25-Hydroxy: 73 ng/mL (ref 30–89)

## 2013-10-10 ENCOUNTER — Other Ambulatory Visit: Payer: Self-pay | Admitting: Internal Medicine

## 2013-10-10 DIAGNOSIS — E782 Mixed hyperlipidemia: Secondary | ICD-10-CM

## 2013-10-10 MED ORDER — ATORVASTATIN CALCIUM 80 MG PO TABS: ORAL_TABLET | ORAL | Status: DC

## 2013-11-23 ENCOUNTER — Ambulatory Visit: Payer: Self-pay | Admitting: Internal Medicine

## 2013-12-07 ENCOUNTER — Ambulatory Visit (INDEPENDENT_AMBULATORY_CARE_PROVIDER_SITE_OTHER): Payer: 59 | Admitting: Physician Assistant

## 2013-12-07 ENCOUNTER — Encounter: Payer: Self-pay | Admitting: Physician Assistant

## 2013-12-07 VITALS — BP 138/78 | HR 72 | Temp 98.7°F | Resp 16 | Wt 235.0 lb

## 2013-12-07 DIAGNOSIS — R300 Dysuria: Secondary | ICD-10-CM

## 2013-12-07 DIAGNOSIS — R1031 Right lower quadrant pain: Secondary | ICD-10-CM

## 2013-12-07 DIAGNOSIS — F17200 Nicotine dependence, unspecified, uncomplicated: Secondary | ICD-10-CM

## 2013-12-07 LAB — CBC WITH DIFFERENTIAL/PLATELET
BASOS PCT: 1 % (ref 0–1)
Basophils Absolute: 0.1 10*3/uL (ref 0.0–0.1)
EOS ABS: 0.3 10*3/uL (ref 0.0–0.7)
Eosinophils Relative: 3 % (ref 0–5)
HCT: 47.6 % (ref 39.0–52.0)
Hemoglobin: 17.1 g/dL — ABNORMAL HIGH (ref 13.0–17.0)
Lymphocytes Relative: 30 % (ref 12–46)
Lymphs Abs: 2.9 10*3/uL (ref 0.7–4.0)
MCH: 31.9 pg (ref 26.0–34.0)
MCHC: 35.9 g/dL (ref 30.0–36.0)
MCV: 88.8 fL (ref 78.0–100.0)
MONOS PCT: 10 % (ref 3–12)
Monocytes Absolute: 1 10*3/uL (ref 0.1–1.0)
NEUTROS PCT: 56 % (ref 43–77)
Neutro Abs: 5.4 10*3/uL (ref 1.7–7.7)
Platelets: 241 10*3/uL (ref 150–400)
RBC: 5.36 MIL/uL (ref 4.22–5.81)
RDW: 13.7 % (ref 11.5–15.5)
WBC: 9.6 10*3/uL (ref 4.0–10.5)

## 2013-12-07 LAB — BASIC METABOLIC PANEL WITH GFR
BUN: 12 mg/dL (ref 6–23)
CALCIUM: 9.5 mg/dL (ref 8.4–10.5)
CO2: 27 mEq/L (ref 19–32)
CREATININE: 0.77 mg/dL (ref 0.50–1.35)
Chloride: 102 mEq/L (ref 96–112)
GFR, Est African American: >89 mL/min
GFR, Est Non African American: >89 mL/min
GLUCOSE: 82 mg/dL (ref 70–99)
Potassium: 4.1 mEq/L (ref 3.5–5.3)
Sodium: 138 mEq/L (ref 135–145)

## 2013-12-07 MED ORDER — CIPROFLOXACIN HCL 500 MG PO TABS: 500.0000 mg | ORAL_TABLET | Freq: Two times a day (BID) | ORAL | Status: AC

## 2013-12-07 MED ORDER — VARENICLINE TARTRATE 1 MG PO TABS: 1.0000 mg | ORAL_TABLET | Freq: Two times a day (BID) | ORAL | Status: DC

## 2013-12-07 NOTE — Patient Instructions (Signed)
We are giving you chantix for smoking cessation. You can do it! And we are here to help! You may have heard some scary side effects about chantix, the three most common I hear about are nausea, crazy dreams and depression.  However, I like for my patients to try to stay on 1/2 a tablet twice a day rather than one tablet twice a day as normally prescribed. This helps decrease the chances of side effects and helps save money by making a one month prescription last two months  Please start the prescription this way:  Start 1/2 tablet by mouth once daily after food with a full glass of water for 3 days Then do 1/2 tablet by mouth twice daily for 4 days.  At this point we have several options: 1) continue on 1/2 tablet twice a day- which I encourage you to do. You can stay on this dose the rest of the time on the medication or if you still feel the need to smoke you can do one of the two options below. 2) do one tablet in the morning and 1/2 in the evening which helps decrease dreams. 3) do one tablet twice a day.   What if I miss a dose? If you miss a dose, take it as soon as you can. If it is almost time for your next dose, take only that dose. Do not take double or extra doses.  What should I watch for while using this medicine? Visit your doctor or health care professional for regular check ups. Ask for ongoing advice and encouragement from your doctor or healthcare professional, friends, and family to help you quit. If you smoke while on this medication, quit again  Your mouth may get dry. Chewing sugarless gum or hard candy, and drinking plenty of water may help. Contact your doctor if the problem does not go away or is severe.  You may get drowsy or dizzy. Do not drive, use machinery, or do anything that needs mental alertness until you know how this medicine affects you. Do not stand or sit up quickly, especially if you are an older patient.   The use of this medicine may increase the chance  of suicidal thoughts or actions. Pay special attention to how you are responding while on this medicine. Any worsening of mood, or thoughts of suicide or dying should be reported to your health care professional right away.  ADVANTAGES OF QUITTING SMOKING  Within 20 minutes, blood pressure decreases. Your pulse is at normal level.  After 8 hours, carbon monoxide levels in the blood return to normal. Your oxygen level increases.  After 24 hours, the chance of having a heart attack starts to decrease. Your breath, hair, and body stop smelling like smoke.  After 48 hours, damaged nerve endings begin to recover. Your sense of taste and smell improve.  After 72 hours, the body is virtually free of nicotine. Your bronchial tubes relax and breathing becomes easier.  After 2 to 12 weeks, lungs can hold more air. Exercise becomes easier and circulation improves.  After 1 year, the risk of coronary heart disease is cut in half.  After 5 years, the risk of stroke falls to the same as a nonsmoker.  After 10 years, the risk of lung cancer is cut in half and the risk of other cancers decreases significantly.  After 15 years, the risk of coronary heart disease drops, usually to the level of a nonsmoker.  You will have extra money   to spend on things other than cigarettes.   We can also add on Wellbutrin to help you quit if you would like.

## 2013-12-07 NOTE — Progress Notes (Signed)
   Subjective:    Patient ID: Javier Vega, male    DOB: 12-13-1960, 53 y.o.   MRN: [id number]  HPI 53 y.o. smoking male presents with back pain. Lower back pain yesterday afternoon at work at 2 PM, More right back pain than left, achy pain with occasional sharp pain, then after dinner he started to have trouble urinating and felt suprapubic pain/tenderness, just dribbling, denies hematuria, fever, chills, nausea last night. He has been feeling tired for the last 3-4 days and then this morning he has had some nausea.    Review of Systems  Constitutional: Positive for fatigue. Negative for fever, chills, diaphoresis, activity change, appetite change and unexpected weight change.  HENT: Negative.   Respiratory: Negative.   Cardiovascular: Negative.   Gastrointestinal: Positive for nausea and abdominal pain. Negative for vomiting, diarrhea, constipation, blood in stool, anal bleeding and rectal pain.  Genitourinary: Positive for dysuria, decreased urine volume and difficulty urinating. Negative for urgency, frequency, hematuria, flank pain, discharge, penile swelling, scrotal swelling, enuresis, genital sores, penile pain and testicular pain.  Musculoskeletal: Positive for back pain. Negative for arthralgias, gait problem, joint swelling, myalgias, neck pain and neck stiffness.       Objective:   Physical Exam  Constitutional: He is oriented to person, place, and time. He appears well-developed and well-nourished.  HENT:  Head: Normocephalic and atraumatic.  Neck: Normal range of motion. Neck supple.  Cardiovascular: Normal rate and regular rhythm.   Pulmonary/Chest: Effort normal and breath sounds normal.  Abdominal: Soft. Bowel sounds are normal. There is tenderness (Right lower quadrant). There is no rebound and no guarding.  Musculoskeletal: Normal range of motion.  Lymphadenopathy:    He has no cervical adenopathy.  Neurological: He is alert and oriented to person, place, and time.   Skin: Skin is warm and dry. No rash noted.       Assessment & Plan:  Dysuria, back pain, dribbling- likely prostate infection, kidney function, or bladder infection-  Smoking cessation discussed and he is at risk for bladder cancer due to this Will check urine, CBC, kidney function  Smoking cessation- interested in quitting- will try chantix.

## 2013-12-08 LAB — URINALYSIS, ROUTINE W REFLEX MICROSCOPIC
BILIRUBIN URINE: NEGATIVE
Glucose, UA: NEGATIVE mg/dL
HGB URINE DIPSTICK: NEGATIVE
KETONES UR: NEGATIVE mg/dL
Leukocytes, UA: NEGATIVE
Nitrite: NEGATIVE
PROTEIN: NEGATIVE mg/dL
Specific Gravity, Urine: 1.007 (ref 1.005–1.030)
UROBILINOGEN UA: 0.2 mg/dL (ref 0.0–1.0)
pH: 6 (ref 5.0–8.0)

## 2013-12-09 LAB — URINE CULTURE
Colony Count: NO GROWTH
Organism ID, Bacteria: NO GROWTH

## 2013-12-09 NOTE — Addendum Note (Signed)
Addended by: Vladimir Crofts on: 12/09/2013 08:31 AM   Modules accepted: Orders

## 2013-12-10 ENCOUNTER — Other Ambulatory Visit: Payer: Self-pay

## 2013-12-10 DIAGNOSIS — R1031 Right lower quadrant pain: Secondary | ICD-10-CM

## 2013-12-15 ENCOUNTER — Ambulatory Visit
Admission: RE | Admit: 2013-12-15 | Discharge: 2013-12-15 | Disposition: A | Discharge status: Home / Self Care | Payer: 59 | Source: Ambulatory Visit | Attending: Physician Assistant | Admitting: Physician Assistant

## 2013-12-15 ENCOUNTER — Other Ambulatory Visit: Payer: Self-pay | Admitting: Physician Assistant

## 2013-12-15 DIAGNOSIS — R1031 Right lower quadrant pain: Secondary | ICD-10-CM

## 2013-12-15 DIAGNOSIS — R300 Dysuria: Secondary | ICD-10-CM

## 2013-12-15 DIAGNOSIS — R34 Anuria and oliguria: Secondary | ICD-10-CM

## 2013-12-15 MED ORDER — IOHEXOL 300 MG/ML  SOLN
125.0000 mL | Freq: Once | INTRAMUSCULAR | Status: AC | PRN
  Administered 2013-12-15: 125 mL via INTRAVENOUS

## 2013-12-22 ENCOUNTER — Other Ambulatory Visit: Payer: Self-pay | Admitting: Physician Assistant

## 2013-12-22 ENCOUNTER — Ambulatory Visit
Admission: RE | Admit: 2013-12-22 | Discharge: 2013-12-22 | Disposition: A | Discharge status: Home / Self Care | Payer: 59 | Source: Ambulatory Visit | Attending: Physician Assistant | Admitting: Physician Assistant

## 2013-12-22 DIAGNOSIS — R1031 Right lower quadrant pain: Secondary | ICD-10-CM

## 2013-12-22 DIAGNOSIS — Z139 Encounter for screening, unspecified: Secondary | ICD-10-CM

## 2013-12-22 DIAGNOSIS — R34 Anuria and oliguria: Secondary | ICD-10-CM

## 2013-12-22 DIAGNOSIS — R935 Abnormal findings on diagnostic imaging of other abdominal regions, including retroperitoneum: Secondary | ICD-10-CM

## 2013-12-22 MED ORDER — GADOBENATE DIMEGLUMINE 529 MG/ML IV SOLN
20.0000 mL | Freq: Once | INTRAVENOUS | Status: AC | PRN
  Administered 2013-12-22: 20 mL via INTRAVENOUS

## 2014-01-10 ENCOUNTER — Ambulatory Visit: Payer: Self-pay | Admitting: Physician Assistant

## 2014-02-15 ENCOUNTER — Ambulatory Visit (INDEPENDENT_AMBULATORY_CARE_PROVIDER_SITE_OTHER): Payer: 59 | Admitting: Physician Assistant

## 2014-02-15 ENCOUNTER — Encounter: Payer: Self-pay | Admitting: Physician Assistant

## 2014-02-15 VITALS — BP 122/72 | HR 72 | Temp 98.2°F | Resp 16 | Ht 69.0 in | Wt 234.0 lb

## 2014-02-15 DIAGNOSIS — E669 Obesity, unspecified: Secondary | ICD-10-CM | POA: Insufficient documentation

## 2014-02-15 DIAGNOSIS — Z79899 Other long term (current) drug therapy: Secondary | ICD-10-CM

## 2014-02-15 DIAGNOSIS — E785 Hyperlipidemia, unspecified: Secondary | ICD-10-CM

## 2014-02-15 DIAGNOSIS — R7309 Other abnormal glucose: Secondary | ICD-10-CM

## 2014-02-15 DIAGNOSIS — I10 Essential (primary) hypertension: Secondary | ICD-10-CM

## 2014-02-15 DIAGNOSIS — E559 Vitamin D deficiency, unspecified: Secondary | ICD-10-CM

## 2014-02-15 DIAGNOSIS — R7303 Prediabetes: Secondary | ICD-10-CM

## 2014-02-15 DIAGNOSIS — R0683 Snoring: Secondary | ICD-10-CM

## 2014-02-15 DIAGNOSIS — F17200 Nicotine dependence, unspecified, uncomplicated: Secondary | ICD-10-CM | POA: Insufficient documentation

## 2014-02-15 NOTE — Progress Notes (Signed)
Assessment and Plan:  Hypertension: Continue medication, monitor blood pressure at home. Continue DASH diet.  Reminder to go to the ER if any CP, SOB, nausea, dizziness, severe HA, changes vision/speech, left arm numbness and tingling, and jaw pain. Cholesterol: Continue diet and exercise. Check cholesterol.  Pre-diabetes-Continue diet and exercise. Check A1C Vitamin D Def- check level and continue medications.  Obesity with co morbidities- long discussion about weight loss, diet, and exercise ? Sleep apnea- get apnea link Smoking cessation- discussed, continue to try chantix  Continue diet and meds as discussed. Further disposition pending results of labs.  HPI 53 y.o. male  presents for 3 month follow up with hypertension, hyperlipidemia, prediabetes and vitamin D. His blood pressure has been controlled at home, today their BP is BP: 122/72 mmHg He does not workout. He denies chest pain, shortness of breath, dizziness.  He is on cholesterol medication and denies myalgias. His cholesterol is at goal. The cholesterol last visit was:   Lab Results  Component Value Date   CHOL 224* 10/08/2013   HDL 37* 10/08/2013   LDLCALC 128* 10/08/2013   TRIG 294* 10/08/2013   CHOLHDL 6.1 10/08/2013   He has been working on diet and exercise for prediabetes, and denies paresthesia of the feet, polydipsia and polyuria. Last A1C in the office was:  Lab Results  Component Value Date   HGBA1C 5.7* 10/08/2013   Patient is on Vitamin D supplement.   Lab Results  Component Value Date   VD25OH 74 10/08/2013     He is working on smoking cessation.  BMI is Body mass index is 34.54 kg/(m^2)., he is struggling with weight loss due to smoking cessation, admits to drinking at least 1 soda daily. Wt Readings from Last 3 Encounters:  02/15/14 234 lb (106.142 kg)  12/07/13 235 lb (106.595 kg)  10/08/13 232 lb 9.6 oz (105.507 kg)    Current Medications:  Current Outpatient Prescriptions on File Prior to Visit   Medication Sig Dispense Refill  . Ascorbic Acid (VITAMIN C PO) Take by mouth daily.      Marland Kitchen aspirin 81 MG chewable tablet Chew 81 mg by mouth daily.      Marland Kitchen atorvastatin (LIPITOR) 80 MG tablet Take 1/2 to 1 tablet daily as directed for choleterol  30 tablet  99  . Cholecalciferol (VITAMIN D PO) Take 8,000 Int'l Units by mouth daily.      . Cyanocobalamin (VITAMIN B 12 PO) Take by mouth daily.      . Multiple Vitamins-Minerals (MENS MULTIVITAMIN PLUS) TABS Take by mouth daily.      . Omega-3 Fatty Acids (FISH OIL PO) Take by mouth 3 (three) times daily.       . varenicline (CHANTIX CONTINUING MONTH PAK) 1 MG tablet Take 1 tablet (1 mg total) by mouth 2 (two) times daily.  56 tablet  2   No current facility-administered medications on file prior to visit.   Medical History:  Past Medical History  Diagnosis Date  . Hyperlipidemia   . Hypertension   . Vitamin D deficiency    Allergies:  Allergies  Allergen Reactions  . Crestor [Rosuvastatin]      Review of Systems: [X]  = complains of  [ ]  = denies  General: Fatigue [ ]  Fever [ ]  Chills [ ]  Weakness [ ]   Insomnia [ ]  Eyes: Redness [ ]  Blurred vision [ ]  Diplopia [ ]   ENT: Congestion [ ]  Sinus Pain [ ]  Post Nasal Drip [ ]  Sore  Throat [ ]  Earache [ ]   Cardiac: Chest pain/pressure [ ]  SOB [ ]  Orthopnea [ ]   Palpitations [ ]   Paroxysmal nocturnal dyspnea[ ]  Claudication [ ]  Edema [ ]   Pulmonary: Cough [ ]  Wheezing[ ]   SOB [ ]   Snoring [ ]   GI: Nausea [ ]  Vomiting[ ]  Dysphagia[ ]  Heartburn[ ]  Abdominal pain [ ]  Constipation [ ] ; Diarrhea [ ] ; BRBPR [ ]  Melena[ ]  GU: Hematuria[ ]  Dysuria [ ]  Nocturia[ ]  Urgency [ ]   Hesitancy [ ]  Discharge [ ]  Neuro: Headaches[ ]  Vertigo[ ]  Paresthesias[ ]  Spasm [ ]  Speech changes [ ]  Incoordination [ ]   Ortho: Arthritis [ ]  Joint pain [ ]  Muscle pain [ ]  Joint swelling [ ]  Back Pain [ ]  Skin:  Rash [ ]   Pruritis [ ]  Change in skin lesion [ ]   Psych: Depression[ ]  Anxiety[ ]  Confusion [ ]  Memory loss [ ]    Heme/Lypmh: Bleeding [ ]  Bruising [ ]  Enlarged lymph nodes [ ]   Endocrine: Visual blurring [ ]  Paresthesia [ ]  Polyuria [ ]  Polydypsea [ ]    Heat/cold intolerance [ ]  Hypoglycemia [ ]   Family history- Review and unchanged Social history- Review and unchanged Physical Exam: BP 122/72  Pulse 72  Temp(Src) 98.2 F (36.8 C)  Resp 16  Ht 5\' 9"  (1.753 m)  Wt 234 lb (106.142 kg)  BMI 34.54 kg/m2 Wt Readings from Last 3 Encounters:  02/15/14 234 lb (106.142 kg)  12/07/13 235 lb (106.595 kg)  10/08/13 232 lb 9.6 oz (105.507 kg)   General Appearance: Well nourished, in no apparent distress. Eyes: PERRLA, EOMs, conjunctiva no swelling or erythema Sinuses: No Frontal/maxillary tenderness ENT/Mouth: Ext aud canals clear, TMs without erythema, bulging. No erythema, swelling, or exudate on post pharynx.  Tonsils not swollen or erythematous. Hearing normal.  Neck: Supple, thyroid normal.  Respiratory: Respiratory effort normal, BS equal bilaterally without rales, rhonchi, wheezing or stridor.  Cardio: RRR with no MRGs. Brisk peripheral pulses without edema.  Abdomen: Soft, + BS.  Non tender, no guarding, rebound, hernias, masses. Lymphatics: Non tender without lymphadenopathy.  Musculoskeletal: Full ROM, 5/5 strength, normal gait.  Skin: Warm, dry without rashes, lesions, ecchymosis.  Neuro: Cranial nerves intact. Normal muscle tone, no cerebellar symptoms. Sensation intact.  Psych: Awake and oriented X 3, normal affect, Insight and Judgment appropriate.    Vicie Mutters, PA-C 3:01 PM [facility]

## 2014-02-15 NOTE — Patient Instructions (Addendum)
Fatty Liver Fatty liver is the accumulation of fat in liver cells. It is also called hepatosteatosis or steatohepatitis. It is normal for your liver to contain some fat. If fat is more than 5 to 10% of your liver's weight, you have fatty liver.  There are often no symptoms (problems) for years while damage is still occurring. People often learn about their fatty liver when they have medical tests for other reasons. Fat can damage your liver for years or even decades without causing problems. When it becomes severe, it can cause fatigue, weight loss, weakness, and confusion. This makes you more likely to develop more serious liver problems. The liver is the largest organ in the body. It does a lot of work and often gives no warning signs when it is sick until late in a disease. The liver has many important jobs including:  Breaking down foods.  Storing vitamins, iron, and other minerals.  Making proteins.  Making bile for food digestion.  Breaking down many products including medications, alcohol and some poisons.  PROGNOSIS  Fatty liver may cause no damage or it can lead to an inflammation of the liver. This is, called steatohepatitis.  Over time the liver may become scarred and hardened. This condition is called cirrhosis. Cirrhosis is serious and may lead to liver failure or cancer. NASH is one of the leading causes of cirrhosis. About 10-20% of Americans have fatty liver and a smaller 2-5% has NASH.  TREATMENT   Weight loss, fat restriction, and exercise in overweight patients produces inconsistent results but is worth trying.  Good control of diabetes may reduce fatty liver.  Eat a balanced, healthy diet.  Increase your physical activity.  There are no medical or surgical treatments for a fatty liver or NASH, but improving your diet and increasing your exercise may help prevent or reverse some of the damage.   We are giving you chantix for smoking cessation. You can do it! And we  are here to help! You may have heard some scary side effects about chantix, the three most common I hear about are nausea, crazy dreams and depression.  However, I like for my patients to try to stay on 1/2 a tablet twice a day rather than one tablet twice a day as normally prescribed. This helps decrease the chances of side effects and helps save money by making a one month prescription last two months  Please start the prescription this way:  Start 1/2 tablet by mouth once daily after food with a full glass of water for 3 days Then do 1/2 tablet by mouth twice daily for 4 days.  At this point we have several options: 1) continue on 1/2 tablet twice a day- which I encourage you to do. You can stay on this dose the rest of the time on the medication or if you still feel the need to smoke you can do one of the two options below. 2) do one tablet in the morning and 1/2 in the evening which helps decrease dreams. 3) do one tablet twice a day.   What if I miss a dose? If you miss a dose, take it as soon as you can. If it is almost time for your next dose, take only that dose. Do not take double or extra doses.  What should I watch for while using this medicine? Visit your doctor or health care professional for regular check ups. Ask for ongoing advice and encouragement from your doctor or healthcare  professional, friends, and family to help you quit. If you smoke while on this medication, quit again  Your mouth may get dry. Chewing sugarless gum or hard candy, and drinking plenty of water may help. Contact your doctor if the problem does not go away or is severe.  You may get drowsy or dizzy. Do not drive, use machinery, or do anything that needs mental alertness until you know how this medicine affects you. Do not stand or sit up quickly, especially if you are an older patient.   The use of this medicine may increase the chance of suicidal thoughts or actions. Pay special attention to how you are  responding while on this medicine. Any worsening of mood, or thoughts of suicide or dying should be reported to your health care professional right away.  ADVANTAGES OF QUITTING SMOKING  Within 20 minutes, blood pressure decreases. Your pulse is at normal level.  After 8 hours, carbon monoxide levels in the blood return to normal. Your oxygen level increases.  After 24 hours, the chance of having a heart attack starts to decrease. Your breath, hair, and body stop smelling like smoke.  After 48 hours, damaged nerve endings begin to recover. Your sense of taste and smell improve.  After 72 hours, the body is virtually free of nicotine. Your bronchial tubes relax and breathing becomes easier.  After 2 to 12 weeks, lungs can hold more air. Exercise becomes easier and circulation improves.  After 1 year, the risk of coronary heart disease is cut in half.  After 5 years, the risk of stroke falls to the same as a nonsmoker.  After 10 years, the risk of lung cancer is cut in half and the risk of other cancers decreases significantly.  After 15 years, the risk of coronary heart disease drops, usually to the level of a nonsmoker.  You will have extra money to spend on things other than cigarettes.

## 2014-02-16 LAB — CBC WITH DIFFERENTIAL/PLATELET
BASOS PCT: 1 % (ref 0–1)
Basophils Absolute: 0.1 10*3/uL (ref 0.0–0.1)
Eosinophils Absolute: 0.3 10*3/uL (ref 0.0–0.7)
Eosinophils Relative: 3 % (ref 0–5)
HCT: 49 % (ref 39.0–52.0)
Hemoglobin: 17 g/dL (ref 13.0–17.0)
Lymphocytes Relative: 35 % (ref 12–46)
Lymphs Abs: 3.4 10*3/uL (ref 0.7–4.0)
MCH: 31.9 pg (ref 26.0–34.0)
MCHC: 34.7 g/dL (ref 30.0–36.0)
MCV: 91.9 fL (ref 78.0–100.0)
Monocytes Absolute: 1 10*3/uL (ref 0.1–1.0)
Monocytes Relative: 10 % (ref 3–12)
NEUTROS PCT: 51 % (ref 43–77)
Neutro Abs: 5 10*3/uL (ref 1.7–7.7)
PLATELETS: 216 10*3/uL (ref 150–400)
RBC: 5.33 MIL/uL (ref 4.22–5.81)
RDW: 12.8 % (ref 11.5–15.5)
WBC: 9.8 10*3/uL (ref 4.0–10.5)

## 2014-02-16 LAB — HEPATIC FUNCTION PANEL
ALBUMIN: 4.8 g/dL (ref 3.5–5.2)
ALT: 22 U/L (ref 0–53)
AST: 20 U/L (ref 0–37)
Alkaline Phosphatase: 84 U/L (ref 39–117)
BILIRUBIN DIRECT: 0.1 mg/dL (ref 0.0–0.3)
Indirect Bilirubin: 0.3 mg/dL (ref 0.2–1.2)
Total Bilirubin: 0.4 mg/dL (ref 0.2–1.2)
Total Protein: 7.2 g/dL (ref 6.0–8.3)

## 2014-02-16 LAB — BASIC METABOLIC PANEL WITH GFR
BUN: 16 mg/dL (ref 6–23)
CALCIUM: 9.8 mg/dL (ref 8.4–10.5)
CO2: 23 mEq/L (ref 19–32)
CREATININE: 0.78 mg/dL (ref 0.50–1.35)
Chloride: 105 mEq/L (ref 96–112)
GFR, Est Non African American: >89 mL/min
GLUCOSE: 81 mg/dL (ref 70–99)
Potassium: 4 mEq/L (ref 3.5–5.3)
SODIUM: 139 meq/L (ref 135–145)

## 2014-02-16 LAB — LIPID PANEL
CHOLESTEROL: 143 mg/dL (ref 0–200)
HDL: 36 mg/dL — ABNORMAL LOW (ref >39)
LDL Cholesterol: 56 mg/dL (ref 0–99)
TRIGLYCERIDES: 255 mg/dL — AB (ref <150)
Total CHOL/HDL Ratio: 4 Ratio
VLDL: 51 mg/dL — AB (ref 0–40)

## 2014-02-16 LAB — TSH: TSH: 1.558 u[IU]/mL (ref 0.350–4.500)

## 2014-02-16 LAB — HEMOGLOBIN A1C
Hgb A1c MFr Bld: 5.7 % — ABNORMAL HIGH (ref <5.7)
MEAN PLASMA GLUCOSE: 117 mg/dL — AB (ref <117)

## 2014-02-16 LAB — MAGNESIUM: MAGNESIUM: 1.9 mg/dL (ref 1.5–2.5)

## 2014-02-16 LAB — VITAMIN D 25 HYDROXY (VIT D DEFICIENCY, FRACTURES): Vit D, 25-Hydroxy: 72 ng/mL (ref 30–89)

## 2014-03-16 ENCOUNTER — Ambulatory Visit (INDEPENDENT_AMBULATORY_CARE_PROVIDER_SITE_OTHER): Payer: 59 | Admitting: Physician Assistant

## 2014-03-16 ENCOUNTER — Ambulatory Visit: Payer: Self-pay | Admitting: Physician Assistant

## 2014-03-16 ENCOUNTER — Encounter: Payer: Self-pay | Admitting: Physician Assistant

## 2014-03-16 VITALS — BP 136/84 | HR 86 | Temp 100.0°F | Resp 18 | Ht 69.0 in | Wt 230.0 lb

## 2014-03-16 DIAGNOSIS — J029 Acute pharyngitis, unspecified: Secondary | ICD-10-CM

## 2014-03-16 MED ORDER — PROMETHAZINE-CODEINE 6.25-10 MG/5ML PO SYRP: 5.0000 mL | ORAL_SOLUTION | Freq: Four times a day (QID) | ORAL | Status: DC | PRN

## 2014-03-16 MED ORDER — AZITHROMYCIN 250 MG PO TABS: ORAL_TABLET | ORAL | Status: AC

## 2014-03-16 MED ORDER — PREDNISONE 20 MG PO TABS: ORAL_TABLET | ORAL | Status: AC

## 2014-03-16 NOTE — Patient Instructions (Signed)
-  Take Tylenol OTC for fever -Take Promethazine-Codeine for cough -Take Prednisone as prescribed for inflammation -Take Z-Pak as prescribed. Make sure you are drinking plenty of water to stay hydrated.    If you are not feeling better in 10-14 days, then please call the office.     Pharyngitis Pharyngitis is redness, pain, and swelling (inflammation) of your pharynx.  CAUSES  Pharyngitis is usually caused by infection. Most of the time, these infections are from viruses (viral) and are part of a cold. However, sometimes pharyngitis is caused by bacteria (bacterial). Pharyngitis can also be caused by allergies. Viral pharyngitis may be spread from person to person by coughing, sneezing, and personal items or utensils (cups, forks, spoons, toothbrushes). Bacterial pharyngitis may be spread from person to person by more intimate contact, such as kissing.  SIGNS AND SYMPTOMS  Symptoms of pharyngitis include:   Sore throat.   Tiredness (fatigue).   Low-grade fever.   Headache.  Joint pain and muscle aches.  Skin rashes.  Swollen lymph nodes.  Plaque-like film on throat or tonsils (often seen with bacterial pharyngitis). DIAGNOSIS  Your health care provider will ask you questions about your illness and your symptoms. Your medical history, along with a physical exam, is often all that is needed to diagnose pharyngitis. Sometimes, a rapid strep test is done. Other lab tests may also be done, depending on the suspected cause.  TREATMENT  Viral pharyngitis will usually get better in 3-4 days without the use of medicine. Bacterial pharyngitis is treated with medicines that kill germs (antibiotics).  HOME CARE INSTRUCTIONS   Drink enough water and fluids to keep your urine clear or pale yellow.   Only take over-the-counter or prescription medicines as directed by your health care provider:   If you are prescribed antibiotics, make sure you finish them even if you start to feel  better.   Do not take aspirin.   Get lots of rest.   Gargle with 8 oz of salt water ( tsp of salt per 1 qt of water) as often as every 1-2 hours to soothe your throat.   Throat lozenges (if you are not at risk for choking) or sprays may be used to soothe your throat. SEEK MEDICAL CARE IF:   You have large, tender lumps in your neck.  You have a rash.  You cough up green, yellow-brown, or bloody spit. SEEK IMMEDIATE MEDICAL CARE IF:   Your neck becomes stiff.  You drool or are unable to swallow liquids.  You vomit or are unable to keep medicines or liquids down.  You have severe pain that does not go away with the use of recommended medicines.  You have trouble breathing (not caused by a stuffy nose). MAKE SURE YOU:   Understand these instructions.  Will watch your condition.  Will get help right away if you are not doing well or get worse. Document Released: 04/08/2005 Document Revised: 01/27/2013 Document Reviewed: 12/14/2012 [facility] Patient Information 2015 [place], [place]. This information is not intended to replace advice given to you by your health care provider. Make sure you discuss any questions you have with your health care provider.

## 2014-03-16 NOTE — Addendum Note (Signed)
Addended by: Charolette Forward on: 03/16/2014 06:19 PM   Modules accepted: Miquel Dunn

## 2014-03-16 NOTE — Progress Notes (Addendum)
Subjective:    Patient ID: Javier Vega, male    DOB: [birth year], 53 y.o.   MRN: [id number]  Fever  This is a new problem. Episode onset: 1 1/2 weeks to 2 weeks. Maximum temperature: did not check temp at home. Associated symptoms include chest pain, congestion, coughing, ear pain, headaches and a sore throat. Pertinent negatives include no rash or wheezing. Treatments tried: Sudafed. The treatment provided no relief.  Sore Throat  This is a new problem. Episode onset: 1 1/2 weeks to 2 weeks ago. The problem has been gradually worsening. Associated symptoms include congestion, coughing, ear pain, headaches and shortness of breath. Pertinent negatives include no trouble swallowing.  Current Smoker-  3-4 cigerettes per day and started at age 33 GFR= >64 on 02/15/14 Review of Systems  Constitutional: Positive for fever and fatigue. Negative for chills and diaphoresis.  HENT: Positive for congestion, ear pain, postnasal drip, rhinorrhea, sinus pressure and sore throat. Negative for trouble swallowing and voice change.        Right ear pain and eustachian tube and mild ear pain in left.  Eyes: Negative.   Respiratory: Positive for cough and shortness of breath. Negative for chest tightness and wheezing.   Cardiovascular: Positive for chest pain.       Chet pain with cough only  Gastrointestinal: Negative.   Skin: Negative.  Negative for rash.  Neurological: Positive for headaches. Negative for dizziness and light-headedness.  Psychiatric/Behavioral: Negative.    Past Medical History  Diagnosis Date  . Hyperlipidemia   . Hypertension   . Vitamin D deficiency    Current Outpatient Prescriptions on File Prior to Visit  Medication Sig Dispense Refill  . Ascorbic Acid (VITAMIN C PO) Take by mouth daily.    Marland Kitchen aspirin 81 MG chewable tablet Chew 81 mg by mouth daily.    Marland Kitchen atorvastatin (LIPITOR) 80 MG tablet Take 1/2 to 1 tablet daily as directed for choleterol 30 tablet 99  . Cholecalciferol  (VITAMIN D PO) Take 8,000 Int'l Units by mouth daily.    . Cyanocobalamin (VITAMIN B 12 PO) Take by mouth daily.    . Multiple Vitamins-Minerals (MENS MULTIVITAMIN PLUS) TABS Take by mouth daily.    . Omega-3 Fatty Acids (FISH OIL PO) Take by mouth 3 (three) times daily.     . varenicline (CHANTIX CONTINUING MONTH PAK) 1 MG tablet Take 1 tablet (1 mg total) by mouth 2 (two) times daily. (Patient not taking: Reported on 03/16/2014) 56 tablet 2   No current facility-administered medications on file prior to visit.   Allergies  Allergen Reactions  . Crestor [Rosuvastatin]      BP 136/84 mmHg  Pulse 86  Temp(Src) 100 F (37.8 C) (Temporal)  Resp 18  Ht 5\' 9"  (1.753 m)  Wt 230 lb (104.327 kg)  BMI 33.95 kg/m2  SpO2 99% Wt Readings from Last 3 Encounters:  03/16/14 230 lb (104.327 kg)  02/15/14 234 lb (106.142 kg)  12/07/13 235 lb (106.595 kg)   Objective:   Physical Exam  Constitutional: He is oriented to person, place, and time. He appears well-developed and well-nourished. He has a sickly appearance. No distress.  HENT:  Head: Normocephalic.  Right Ear: External ear and ear canal normal. Tympanic membrane is retracted. Tympanic membrane is not injected, not scarred, not perforated, not erythematous and not bulging.  Left Ear: External ear and ear canal normal. Tympanic membrane is retracted. Tympanic membrane is not injected, not scarred, not perforated, not erythematous and not  bulging.  Nose: Nose normal. Right sinus exhibits no maxillary sinus tenderness and no frontal sinus tenderness. Left sinus exhibits no maxillary sinus tenderness and no frontal sinus tenderness.  Mouth/Throat: Uvula is midline and mucous membranes are normal. Mucous membranes are not pale and not dry. No trismus in the jaw. No uvula swelling. Posterior oropharyngeal erythema present. No oropharyngeal exudate, posterior oropharyngeal edema or tonsillar abscesses.  Eyes: Conjunctivae and lids are normal. Pupils  are equal, round, and reactive to light. Right eye exhibits no discharge. Left eye exhibits no discharge. No scleral icterus.  Neck: Trachea normal, normal range of motion and phonation normal. Neck supple. No tracheal tenderness present. No tracheal deviation present.  Cardiovascular: Normal rate, regular rhythm, S1 normal, S2 normal, normal heart sounds and normal pulses.  Exam reveals no gallop, no distant heart sounds and no friction rub.   No murmur heard. Pulmonary/Chest: Effort normal and breath sounds normal. No stridor. No respiratory distress. He has no decreased breath sounds. He has no wheezes. He has no rhonchi. He has no rales. He exhibits no tenderness.  Abdominal: Soft. Bowel sounds are normal. There is no tenderness. There is no rebound and no guarding.  Lymphadenopathy:       Head (right side): Tonsillar adenopathy present. No submental, no submandibular, no preauricular, no posterior auricular and no occipital adenopathy present.       Head (left side): Tonsillar adenopathy present. No submental, no submandibular, no preauricular, no posterior auricular and no occipital adenopathy present.    He has no cervical adenopathy.       Right: No supraclavicular adenopathy present.       Left: No supraclavicular adenopathy present.  Neurological: He is alert and oriented to person, place, and time. Gait normal.  Skin: Skin is warm, dry and intact. No rash noted. He is not diaphoretic.  Psychiatric: He has a normal mood and affect. His speech is normal and behavior is normal. Judgment and thought content normal. Cognition and memory are normal.  Vitals reviewed.     Assessment & Plan:  1. Acute pharyngitis, unspecified pharyngitis type -Take Z-Pak as prescribed- azithromycin (ZITHROMAX) 250 MG tablet; Take 2 tablets PO on day 1, then 1 tablet PO Q24H x 4 days  Dispense: 6 tablet; Refill: 0 -Take Prednisone as prescribed for inflammation-  predniSONE (DELTASONE) 20 MG tablet; Take 3  tablets PO QDaily for 3 days, then take 2 tablets PO QDaily for 3 days, then take 1 tablet PO QDaily for 3 days  Dispense: 18 tablet; Refill: 0 -Take Promethazine with Codeine as prescribed for cough- promethazine-codeine (PHENERGAN WITH CODEINE) 6.25-10 MG/5ML syrup; Take 5 mLs by mouth every 6 (six) hours as needed for cough. Max: 51mL per day  Dispense: 240 mL; Refill: 0 -Take Tylenol OTC for fever  Discussed medication effects and SE's.  Pt agreed to treatment plan. If you are not feeling better in 10-14 days, then please call the office.  Shakiyah Cirilo, Stephani Police, PA-C 1:34 PM [facility]

## 2014-03-31 ENCOUNTER — Telehealth: Payer: Self-pay | Admitting: Physician Assistant

## 2014-03-31 DIAGNOSIS — J029 Acute pharyngitis, unspecified: Secondary | ICD-10-CM

## 2014-03-31 MED ORDER — PROMETHAZINE-DM 6.25-15 MG/5ML PO SYRP: 5.0000 mL | ORAL_SOLUTION | Freq: Four times a day (QID) | ORAL | Status: AC | PRN

## 2014-03-31 MED ORDER — PREDNISONE 20 MG PO TABS: ORAL_TABLET | ORAL | Status: AC

## 2014-03-31 MED ORDER — AZITHROMYCIN 250 MG PO TABS: ORAL_TABLET | ORAL | Status: AC

## 2014-03-31 NOTE — Telephone Encounter (Signed)
Patient was seen on 03/16/14.  Patient had pharyngitis before. Was given Z-Pak, Prednisone, Promethazine-Codeine.  Patient stated he took antibiotic and got better, but still having the ear ache and dizzy and coughing.  Will send in another refill of Z-Pak, Prednisone and Promethazine-DM to [facility] on [address].  If patient is not feeling better in 7-10 days, then needs to come in for office visit.     Discussed medication effects and SE's.  Pt agreed to treatment plan.  Shaniqwa Horsman, Stephani Police, PA-C 5:27 PM

## 2014-04-27 ENCOUNTER — Ambulatory Visit (HOSPITAL_COMMUNITY)
Admission: RE | Admit: 2014-04-27 | Discharge: 2014-04-27 | Disposition: A | Discharge status: Home / Self Care | Payer: 59 | Source: Ambulatory Visit | Attending: Physician Assistant | Admitting: Physician Assistant

## 2014-04-27 ENCOUNTER — Ambulatory Visit (INDEPENDENT_AMBULATORY_CARE_PROVIDER_SITE_OTHER): Payer: 59 | Admitting: Physician Assistant

## 2014-04-27 ENCOUNTER — Encounter: Payer: Self-pay | Admitting: Physician Assistant

## 2014-04-27 VITALS — BP 124/80 | HR 86 | Temp 98.0°F | Resp 18 | Ht 69.0 in | Wt 230.0 lb

## 2014-04-27 DIAGNOSIS — R7611 Nonspecific reaction to tuberculin skin test without active tuberculosis: Secondary | ICD-10-CM | POA: Diagnosis present

## 2014-04-27 DIAGNOSIS — R059 Cough, unspecified: Secondary | ICD-10-CM

## 2014-04-27 DIAGNOSIS — R0602 Shortness of breath: Secondary | ICD-10-CM

## 2014-04-27 DIAGNOSIS — F17210 Nicotine dependence, cigarettes, uncomplicated: Secondary | ICD-10-CM | POA: Insufficient documentation

## 2014-04-27 DIAGNOSIS — R21 Rash and other nonspecific skin eruption: Secondary | ICD-10-CM

## 2014-04-27 DIAGNOSIS — R0989 Other specified symptoms and signs involving the circulatory and respiratory systems: Secondary | ICD-10-CM | POA: Diagnosis not present

## 2014-04-27 DIAGNOSIS — M47894 Other spondylosis, thoracic region: Secondary | ICD-10-CM | POA: Insufficient documentation

## 2014-04-27 DIAGNOSIS — R509 Fever, unspecified: Secondary | ICD-10-CM | POA: Insufficient documentation

## 2014-04-27 DIAGNOSIS — J209 Acute bronchitis, unspecified: Secondary | ICD-10-CM

## 2014-04-27 DIAGNOSIS — R05 Cough: Secondary | ICD-10-CM

## 2014-04-27 MED ORDER — DOXYCYCLINE HYCLATE 100 MG PO TABS: 100.0000 mg | ORAL_TABLET | Freq: Two times a day (BID) | ORAL | Status: DC

## 2014-04-27 NOTE — Patient Instructions (Signed)
-Take doxycycline as prescribed with food. -Get chest x-ray at [facility] hospital as soon as possible.  Please drink plenty of fluid to stay hydrated.  Acute Bronchitis Bronchitis is inflammation of the airways that extend from the windpipe into the lungs (bronchi). The inflammation often causes mucus to develop. This leads to a cough, which is the most common symptom of bronchitis.  In acute bronchitis, the condition usually develops suddenly and goes away over time, usually in a couple weeks. Smoking, allergies, and asthma can make bronchitis worse. Repeated episodes of bronchitis may cause further lung problems.  CAUSES Acute bronchitis is most often caused by the same virus that causes a cold. The virus can spread from person to person (contagious) through coughing, sneezing, and touching contaminated objects. SIGNS AND SYMPTOMS   Cough.   Fever.   Coughing up mucus.   Body aches.   Chest congestion.   Chills.   Shortness of breath.   Sore throat.  DIAGNOSIS  Acute bronchitis is usually diagnosed through a physical exam. Your health care provider will also ask you questions about your medical history. Tests, such as chest X-rays, are sometimes done to rule out other conditions.  TREATMENT  Acute bronchitis usually goes away in a couple weeks. Oftentimes, no medical treatment is necessary. Medicines are sometimes given for relief of fever or cough. Antibiotic medicines are usually not needed but may be prescribed in certain situations. In some cases, an inhaler may be recommended to help reduce shortness of breath and control the cough. A cool mist vaporizer may also be used to help thin bronchial secretions and make it easier to clear the chest.  HOME CARE INSTRUCTIONS  Get plenty of rest.   Drink enough fluids to keep your urine clear or pale yellow (unless you have a medical condition that requires fluid restriction). Increasing fluids may help thin your respiratory  secretions (sputum) and reduce chest congestion, and it will prevent dehydration.   Take medicines only as directed by your health care provider.  If you were prescribed an antibiotic medicine, finish it all even if you start to feel better.  Avoid smoking and secondhand smoke. Exposure to cigarette smoke or irritating chemicals will make bronchitis worse. If you are a smoker, consider using nicotine gum or skin patches to help control withdrawal symptoms. Quitting smoking will help your lungs heal faster.   Reduce the chances of another bout of acute bronchitis by washing your hands frequently, avoiding people with cold symptoms, and trying not to touch your hands to your mouth, nose, or eyes.   Keep all follow-up visits as directed by your health care provider.  SEEK MEDICAL CARE IF: Your symptoms do not improve after 1 week of treatment.  SEEK IMMEDIATE MEDICAL CARE IF:  You develop an increased fever or chills.   You have chest pain.   You have severe shortness of breath.  You have bloody sputum.   You develop dehydration.  You faint or repeatedly feel like you are going to pass out.  You develop repeated vomiting.  You develop a severe headache. MAKE SURE YOU:   Understand these instructions.  Will watch your condition.  Will get help right away if you are not doing well or get worse. Document Released: 05/16/2004 Document Revised: 08/23/2013 Document Reviewed: 09/29/2012 [facility] Patient Information 2015 [place], [place]. This information is not intended to replace advice given to you by your health care provider. Make sure you discuss any questions you have with your health  care provider.  

## 2014-04-27 NOTE — Progress Notes (Signed)
Subjective:    Patient ID: Javier Vega, male    DOB: [birth year], 54 y.o.   MRN: [id number]  Sinus Problem This is a recurrent problem. The problem is unchanged. There has been no fever. Associated symptoms include congestion, headaches, shortness of breath, sinus pressure and a sore throat. Pertinent negatives include no chills, coughing, diaphoresis, ear pain, hoarse voice or swollen glands.  Last seen 03/16/14- Was given 2 Z-Paks and 2 prednisone tapers, Promethazine with Codeine.  Patient states he was feeling better, but sxs would just keep coming back.  However, having chest tightness in lower lung areas. Current Smoker-Down to 5 cigarettes a day GFR= >89 on 02/15/14 Review of Systems  Constitutional: Positive for fatigue. Negative for fever, chills and diaphoresis.  HENT: Positive for congestion, postnasal drip, rhinorrhea, sinus pressure, sore throat and tinnitus. Negative for ear discharge, ear pain, hoarse voice, trouble swallowing and voice change.   Eyes: Negative.   Respiratory: Positive for chest tightness, shortness of breath and wheezing. Negative for cough.   Cardiovascular: Negative.   Gastrointestinal: Positive for nausea. Negative for vomiting, abdominal pain, diarrhea and constipation.  Genitourinary: Negative.   Musculoskeletal: Negative.   Skin: Positive for rash.       Patient states he has rash on inner thighs.  Patient refuses for me to look at rash.  States he wants to continue hydrocortisone cream.  Told patient that if it is not getting better, then he needs to come in so we can look at it.  Neurological: Positive for headaches. Negative for dizziness and light-headedness.  Psychiatric/Behavioral: Negative.    Past Medical History  Diagnosis Date  . Hyperlipidemia   . Hypertension   . Vitamin D deficiency    Current Outpatient Prescriptions on File Prior to Visit  Medication Sig Dispense Refill  . Ascorbic Acid (VITAMIN C PO) Take by mouth daily.    Marland Kitchen  aspirin 81 MG chewable tablet Chew 81 mg by mouth daily.    Marland Kitchen atorvastatin (LIPITOR) 80 MG tablet Take 1/2 to 1 tablet daily as directed for choleterol 30 tablet 99  . Cholecalciferol (VITAMIN D PO) Take 8,000 Int'l Units by mouth daily.    . Cyanocobalamin (VITAMIN B 12 PO) Take by mouth daily.    . Multiple Vitamins-Minerals (MENS MULTIVITAMIN PLUS) TABS Take by mouth daily.    . Omega-3 Fatty Acids (FISH OIL PO) Take by mouth 3 (three) times daily.     . varenicline (CHANTIX CONTINUING MONTH PAK) 1 MG tablet Take 1 tablet (1 mg total) by mouth 2 (two) times daily. (Patient not taking: Reported on 04/27/2014) 56 tablet 2   No current facility-administered medications on file prior to visit.   Allergies  Allergen Reactions  . Crestor [Rosuvastatin]      BP 124/80 mmHg  Pulse 86  Temp(Src) 98 F (36.7 C) (Temporal)  Resp 18  Ht 5\' 9"  (1.753 m)  Wt 230 lb (104.327 kg)  BMI 33.95 kg/m2  SpO2 98% Wt Readings from Last 3 Encounters:  04/27/14 230 lb (104.327 kg)  03/16/14 230 lb (104.327 kg)  02/15/14 234 lb (106.142 kg)   Objective:   Physical Exam  Constitutional: He is oriented to person, place, and time. He appears well-developed and well-nourished. He does not have a sickly appearance. No distress.  HENT:  Head: Normocephalic.  Right Ear: Tympanic membrane, external ear and ear canal normal.  Left Ear: Tympanic membrane, external ear and ear canal normal.  Nose: Nose normal. Right sinus exhibits  no maxillary sinus tenderness and no frontal sinus tenderness. Left sinus exhibits no maxillary sinus tenderness and no frontal sinus tenderness.  Mouth/Throat: Uvula is midline and mucous membranes are normal. Mucous membranes are not pale and not dry. No uvula swelling. Posterior oropharyngeal erythema present. No oropharyngeal exudate, posterior oropharyngeal edema or tonsillar abscesses.  Eyes: Conjunctivae, EOM and lids are normal. Pupils are equal, round, and reactive to light. Right  eye exhibits no discharge. Left eye exhibits no discharge. No scleral icterus.  Neck: Trachea normal, normal range of motion and phonation normal. Neck supple. No tracheal tenderness present. No tracheal deviation present.  Cardiovascular: Normal rate, regular rhythm, S1 normal, S2 normal, normal heart sounds and normal pulses.  Exam reveals no gallop, no distant heart sounds and no friction rub.   No murmur heard. Pulmonary/Chest: Effort normal. No stridor. No respiratory distress. He has decreased breath sounds. He has no wheezes. He has no rhonchi. He has no rales. He exhibits no tenderness.  Abdominal: Soft. Bowel sounds are normal. There is no tenderness. There is no rebound and no guarding.  Lymphadenopathy:  No tenderness or LAD.  Neurological: He is alert and oriented to person, place, and time. No cranial nerve deficit. Gait normal.  Skin: Skin is warm, dry and intact. Rash noted. He is not diaphoretic. No pallor.  Patient refused for me to look at rash area on thighs and will continue to use OTC hydrocortisone cream  Psychiatric: He has a normal mood and affect. His speech is normal and behavior is normal. Judgment and thought content normal. Cognition and memory are normal.  Vitals reviewed.  Assessment & Plan:  1. Acute bronchitis, unspecified organism -Take doxycycline as prescribed with food- doxycycline (VIBRA-TABS) 100 MG tablet; Take 1 tablet (100 mg total) by mouth 2 (two) times daily. For 7 days with food  Dispense: 14 tablet; Refill: 0  2. SOB (shortness of breath) and Cough Ordered chest x-ray to R/O Pneumonia - DG Chest 2 View; Future  3. Rash  -Please continue hydrocortisone cream OTC.  If rash does not improve, then need to look at it at your next visit or sooner.  Discussed medication effects and SE's.  Pt agreed to treatment plan. If you are not feeling better in 10-14 days, then please call the office. Please keep your physical appt on 05/19/14.  Addendum: Chest  x-ray showed: "FINDINGS: The heart size and mediastinal contours are within normal limits. Calcified atherosclerotic plaque noted within the aortic arch. Both lungs are clear. There is mild spondylosis within the thoracic spine. IMPRESSION: No active cardiopulmonary disease."  Told patient to continue current prescriptions and if this issue continues (especially sinus pressure/sxs), then he needs to see ENT specialist.  Charolette Forward, PA-C 11:31 AM [facility]

## 2014-05-19 ENCOUNTER — Ambulatory Visit (INDEPENDENT_AMBULATORY_CARE_PROVIDER_SITE_OTHER): Payer: 59 | Admitting: Internal Medicine

## 2014-05-19 ENCOUNTER — Encounter: Payer: Self-pay | Admitting: Internal Medicine

## 2014-05-19 VITALS — BP 126/84 | HR 76 | Temp 98.1°F | Resp 16 | Ht 69.0 in | Wt 231.2 lb

## 2014-05-19 DIAGNOSIS — Z125 Encounter for screening for malignant neoplasm of prostate: Secondary | ICD-10-CM

## 2014-05-19 DIAGNOSIS — Z1212 Encounter for screening for malignant neoplasm of rectum: Secondary | ICD-10-CM

## 2014-05-19 DIAGNOSIS — R5383 Other fatigue: Secondary | ICD-10-CM

## 2014-05-19 DIAGNOSIS — Z79899 Other long term (current) drug therapy: Secondary | ICD-10-CM

## 2014-05-19 DIAGNOSIS — E785 Hyperlipidemia, unspecified: Secondary | ICD-10-CM

## 2014-05-19 DIAGNOSIS — F17200 Nicotine dependence, unspecified, uncomplicated: Secondary | ICD-10-CM

## 2014-05-19 DIAGNOSIS — E559 Vitamin D deficiency, unspecified: Secondary | ICD-10-CM

## 2014-05-19 DIAGNOSIS — I10 Essential (primary) hypertension: Secondary | ICD-10-CM

## 2014-05-19 DIAGNOSIS — E669 Obesity, unspecified: Secondary | ICD-10-CM

## 2014-05-19 DIAGNOSIS — R7303 Prediabetes: Secondary | ICD-10-CM

## 2014-05-19 DIAGNOSIS — R7309 Other abnormal glucose: Secondary | ICD-10-CM

## 2014-05-19 LAB — CBC WITH DIFFERENTIAL/PLATELET
Basophils Absolute: 0.1 10*3/uL (ref 0.0–0.1)
Basophils Relative: 1 % (ref 0–1)
EOS ABS: 0.2 10*3/uL (ref 0.0–0.7)
EOS PCT: 2 % (ref 0–5)
HCT: 47.3 % (ref 39.0–52.0)
HEMOGLOBIN: 16.5 g/dL (ref 13.0–17.0)
LYMPHS ABS: 2.9 10*3/uL (ref 0.7–4.0)
LYMPHS PCT: 26 % (ref 12–46)
MCH: 31.9 pg (ref 26.0–34.0)
MCHC: 34.9 g/dL (ref 30.0–36.0)
MCV: 91.3 fL (ref 78.0–100.0)
MONOS PCT: 8 % (ref 3–12)
MPV: 10.3 fL (ref 8.6–12.4)
Monocytes Absolute: 0.9 10*3/uL (ref 0.1–1.0)
NEUTROS PCT: 63 % (ref 43–77)
Neutro Abs: 7 10*3/uL (ref 1.7–7.7)
Platelets: 250 10*3/uL (ref 150–400)
RBC: 5.18 MIL/uL (ref 4.22–5.81)
RDW: 13.7 % (ref 11.5–15.5)
WBC: 11.1 10*3/uL — ABNORMAL HIGH (ref 4.0–10.5)

## 2014-05-19 NOTE — Progress Notes (Signed)
Patient ID: Javier Vega, male   DOB: 09-04-1960, 54 y.o.   MRN: [id number] Annual Comprehensive Examination  This very nice 54 y.o. MWM presents for complete physical.  Patient has been followed for HTN,  Prediabetes, Hyperlipidemia, and Vitamin D Deficiency.   Labile HTN predates since 2005 and has been monitored expectantly. Patient's BP has been controlled at home.Today's BP: 126/84 mmHg. Patient denies any cardiac symptoms as chest pain, palpitations, shortness of breath, dizziness or ankle swelling.   Patient's hyperlipidemia is controlled with diet and medications. Patient denies myalgias or other medication SE's. Last lipids were at goal - Total Chol 143; HDL 36*; LDL  56; with elevated Triglycerides 255 on 02/15/2014:   Patient has  prediabetes since Dec 2010 with A1c 5.7% and patient denies reactive hypoglycemic symptoms, visual blurring, diabetic polys or paresthesias. Last A1c was  5.7% on10/27/2015.   Finally, patient has history of Vitamin D Deficiency of 10 in 2008 and last vitamin D was 72 on 02/15/2014.  Medication Sig  . VITAMIN C  Take  daily.  Marland Kitchen aspirin 81 MG chewable tablet Chew 81 mg  daily.  Marland Kitchen atorvastatin (LIPITOR) 80 MG tablet Take 1/2 to 1 tablet daily as directed for cholesterol  . Cholecalciferol (VITAMIN D PO) Take 8,000 Int'l Units by mouth daily.  Marland Kitchen VITAMIN B 12  Take by mouth daily.  Marland Kitchen MENS MULTIVITAMIN PLUS Take by mouth daily.  Marland Kitchen FISH OIL  Take by mouth 3  times daily.   . varenicline (CHANTIX CONTINUING MONTH PAK) 1 mg Take 1 tablet 2  times daily. (Patient not taking: Reported on 04/27/2014)   Allergies  Allergen Reactions  . Crestor [Rosuvastatin]    Past Medical History  Diagnosis Date  . Hyperlipidemia   . Hypertension   . Vitamin D deficiency    Health Maintenance  Topic Date Due  . COLONOSCOPY  03/25/2011  . INFLUENZA VACCINE  11/21/2014  . TETANUS/TDAP  12/22/2015   Immunization History  Administered Date(s) Administered  . MMR  12/21/2005  . Pneumococcal-Unspecified 04/22/2009  . Tdap 12/21/2005   Past Surgical History  Procedure Laterality Date  . Carpal tunnel release  2002  . Cysto  2002   Family History  Problem Relation Age of Onset  . Adopted: Yes   History   Social History  . Marital Status: Married    Spouse Name: N/A    Number of Children: N/A  . Years of Education: N/A   Occupational History  . Not on file.   Social History Main Topics  . Smoking status: Current Every Day Smoker -- 0.25 packs/day for 36 years    Types: Cigarettes  . Smokeless tobacco: Not on file     Comment: smokes 5 cigarettes daily  . Alcohol Use: Yes     Comment: Occ  . Drug Use: No  . Sexual Activity: Not on file    ROS Constitutional: Denies fever, chills, weight loss/gain, headaches, insomnia, fatigue, night sweats or change in appetite. Eyes: Denies redness, blurred vision, diplopia, discharge, itchy or watery eyes.  ENT: Denies discharge, congestion, post nasal drip, epistaxis, sore throat, earache, hearing loss, dental pain, Tinnitus, Vertigo, Sinus pain or snoring.  Cardio: Denies chest pain, palpitations, irregular heartbeat, syncope, dyspnea, diaphoresis, orthopnea, PND, claudication or edema Respiratory: denies cough, dyspnea, DOE, pleurisy, hoarseness, laryngitis or wheezing.  Gastrointestinal: Denies dysphagia, heartburn, reflux, water brash, pain, cramps, nausea, vomiting, bloating, diarrhea, constipation, hematemesis, melena, hematochezia, jaundice or hemorrhoids Genitourinary: Denies dysuria, frequency, urgency, nocturia,  hesitancy, discharge, hematuria or flank pain Musculoskeletal: Denies arthralgia, myalgia, stiffness, Jt. Swelling, pain, limp or strain/sprain. Denies Falls. Skin: Denies puritis, rash, hives, warts, acne, eczema or change in skin lesion Neuro: No weakness, tremor, incoordination, spasms, paresthesia or pain Psychiatric: Denies confusion, memory loss or sensory loss. Denies  Depression. Endocrine: Denies change in weight, skin, hair change, nocturia, and paresthesia, diabetic polys, visual blurring or hyper / hypo glycemic episodes.  Heme/Lymph: No excessive bleeding, bruising or enlarged lymph nodes.  Physical Exam  BP 126/84   Pulse 76  Temp 98.1 F   Resp 16  Ht 5' 9"    Wt 231 lb     BMI 34.13   General Appearance: Well nourished, in no apparent distress. Eyes: PERRLA, EOMs, conjunctiva no swelling or erythema, normal fundi and vessels. Sinuses: No frontal/maxillary tenderness ENT/Mouth: EACs patent / TMs  nl. Nares clear without erythema, swelling, mucoid exudates. Oral hygiene is good. No erythema, swelling, or exudate. Tongue normal, non-obstructing. Tonsils not swollen or erythematous. Hearing normal.  Neck: Supple, thyroid normal. No bruits, nodes or JVD. Respiratory: Respiratory effort normal.  BS equal and clear bilateral without rales, rhonci, wheezing or stridor. Cardio: Heart sounds are normal with regular rate and rhythm and no murmurs, rubs or gallops. Peripheral pulses are normal and equal bilaterally without edema. No aortic or femoral bruits. Chest: symmetric with normal excursions and percussion.  Abdomen: Flat, soft, with bowl sounds. Nontender, no guarding, rebound, hernias, masses, or organomegaly.  Lymphatics: Non tender without lymphadenopathy.  Genitourinary: No hernias.Testes nl. DRE - prostate nl for age - smooth & firm w/o nodules. Musculoskeletal: Full ROM all peripheral extremities, joint stability, 5/5 strength, and normal gait. Skin: Warm and dry without rashes, lesions, cyanosis, clubbing or  ecchymosis.  Neuro: Cranial nerves intact, reflexes equal bilaterally. Normal muscle tone, no cerebellar symptoms. Sensation intact.  Pysch: Awake and oriented X 3 with normal affect, insight and judgment appropriate.   Assessment and Plan  1. Essential hypertension  - Microalbumin / creatinine urine ratio - EKG 12-Lead - [place],  RETROPERITNL ABD,  LTD  2. Hyperlipidemia  - Lipid panel  3. Prediabetes  - Hemoglobin A1c - Insulin, fasting  4. Vitamin D deficiency  - Vit D  25 hydroxy (rtn osteoporosis monitoring)  5. Obesity   6. Tobacco use disorder   7. Screening for rectal cancer  - POC Hemoccult Bld/Stl (3-Cd Home Screen); Future  8. Prostate cancer screening  - PSA - Iron and TIBC  9. Other fatigue  - Vitamin B12 - Testosterone - TSH  10. Encounter for long-term (current) use of medications  - Urine Microscopic - CBC with Differential/Platelet - BASIC METABOLIC PANEL WITH GFR - Hepatic function panel - Magnesium   Continue prudent diet as discussed, weight control, BP monitoring, regular exercise, and medications as discussed.  Discussed med effects and SE's. Routine screening labs and tests as requested with regular follow-up as recommended.

## 2014-05-19 NOTE — Patient Instructions (Signed)
Recommend the book "The END of DIETING" by Dr Excell Seltzer   & the book "The END of DIABETES " by Dr Excell Seltzer  At [email or web address] - get book & Audio CD's      Being diabetic has a  300% increased risk for heart attack, stroke, cancer, and alzheimer- type vascular dementia. It is very important that you work harder with diet by avoiding all foods that are white except chicken & fish. Avoid white rice (brown & wild rice is OK), white potatoes (sweetpotatoes in moderation is OK), White bread or wheat bread or anything made out of white flour like bagels, donuts, rolls, buns, biscuits, cakes, pastries, cookies, pizza crust, and pasta (made from white flour & egg whites) - vegetarian pasta or spinach or wheat pasta is OK. Multigrain breads like Arnold's or Pepper[employer], or multigrain sandwich thins or flatbreads.  Diet, exercise and weight loss can reverse and cure diabetes in the early stages.  Diet, exercise and weight loss is very important in the control and prevention of complications of diabetes which affects every system in your body, ie. Brain - dementia/stroke, eyes - glaucoma/blindness, heart - heart attack/heart failure, kidneys - dialysis, stomach - gastric paralysis, intestines - malabsorption, nerves - severe painful neuritis, circulation - gangrene & loss of a leg(s), and finally cancer and Alzheimers.    I recommend avoid fried & greasy foods,  sweets/candy, white rice (brown or wild rice or Quinoa is OK), white potatoes (sweet potatoes are OK) - anything made from white flour - bagels, doughnuts, rolls, buns, biscuits,white and wheat breads, pizza crust and traditional pasta made of white flour & egg white(vegetarian pasta or spinach or wheat pasta is OK).  Multi-grain bread is OK - like multi-grain flat bread or sandwich thins. Avoid alcohol in excess. Exercise is also important.    Eat all the vegetables you want - avoid meat, especially red meat and dairy - especially cheese.  Cheese  is the most concentrated form of trans-fats which is the worst thing to clog up our arteries. Veggie cheese is OK which can be found in the fresh produce section at [employer] or [employer] or [employer]  Preventive Care for Adults  A healthy lifestyle and preventive care can promote health and wellness. Preventive health guidelines for men include the following key practices:  A routine yearly physical is a good way to check with your health care provider about your health and preventative screening. It is a chance to share any concerns and updates on your health and to receive a thorough exam.  Visit your dentist for a routine exam and preventative care every 6 months. Brush your teeth twice a day and floss once a day. Good oral hygiene prevents tooth decay and gum disease.  The frequency of eye exams is based on your age, health, family medical history, use of contact lenses, and other factors. Follow your health care provider's recommendations for frequency of eye exams.  Eat a healthy diet. Foods such as vegetables, fruits, whole grains, low-fat dairy products, and lean protein foods contain the nutrients you need without too many calories. Decrease your intake of foods high in solid fats, added sugars, and salt. Eat the right amount of calories for you.Get information about a proper diet from your health care provider, if necessary.  Regular physical exercise is one of the most important things you can do for your health. Most adults should get at least 150 minutes of moderate-intensity exercise (any activity  that increases your heart rate and causes you to sweat) each week. In addition, most adults need muscle-strengthening exercises on 2 or more days a week.  Maintain a healthy weight. The body mass index (BMI) is a screening tool to identify possible weight problems. It provides an estimate of body fat based on height and weight. Your health care provider can find your BMI and can help  you achieve or maintain a healthy weight.For adults 20 years and older:  A BMI below 18.5 is considered underweight.  A BMI of 18.5 to 24.9 is normal.  A BMI of 25 to 29.9 is considered overweight.  A BMI of 30 and above is considered obese.  Maintain normal blood lipids and cholesterol levels by exercising and minimizing your intake of saturated fat. Eat a balanced diet with plenty of fruit and vegetables. Blood tests for lipids and cholesterol should begin at age 20 and be repeated every 5 years. If your lipid or cholesterol levels are high, you are over 50, or you are at high risk for heart disease, you may need your cholesterol levels checked more frequently.Ongoing high lipid and cholesterol levels should be treated with medicines if diet and exercise are not working.  If you smoke, find out from your health care provider how to quit. If you do not use tobacco, do not start.  Lung cancer screening is recommended for adults aged 55-80 years who are at high risk for developing lung cancer because of a history of smoking. A yearly low-dose CT scan of the lungs is recommended for people who have at least a 30-pack-year history of smoking and are a current smoker or have quit within the past 15 years. A pack year of smoking is smoking an average of 1 pack of cigarettes a day for 1 year (for example: 1 pack a day for 30 years or 2 packs a day for 15 years). Yearly screening should continue until the smoker has stopped smoking for at least 15 years. Yearly screening should be stopped for people who develop a health problem that would prevent them from having lung cancer treatment.  If you choose to drink alcohol, do not have more than 2 drinks per day. One drink is considered to be 12 ounces (355 mL) of beer, 5 ounces (148 mL) of wine, or 1.5 ounces (44 mL) of liquor.  Avoid use of street drugs. Do not share needles with anyone. Ask for help if you need support or instructions about stopping the  use of drugs.  High blood pressure causes heart disease and increases the risk of stroke. Your blood pressure should be checked at least every 1-2 years. Ongoing high blood pressure should be treated with medicines, if weight loss and exercise are not effective.  If you are 45-79 years old, ask your health care provider if you should take aspirin to prevent heart disease.  Diabetes screening involves taking a blood sample to check your fasting blood sugar level. This should be done once every 3 years, after age 45, if you are within normal weight and without risk factors for diabetes. Testing should be considered at a younger age or be carried out more frequently if you are overweight and have at least 1 risk factor for diabetes.  Colorectal cancer can be detected and often prevented. Most routine colorectal cancer screening begins at the age of 50 and continues through age 75. However, your health care provider may recommend screening at an earlier age if you have   risk factors for colon cancer. On a yearly basis, your health care provider may provide home test kits to check for hidden blood in the stool. Use of a small camera at the end of a tube to directly examine the colon (sigmoidoscopy or colonoscopy) can detect the earliest forms of colorectal cancer. Talk to your health care provider about this at age 50, when routine screening begins. Direct exam of the colon should be repeated every 5-10 years through age 75, unless early forms of precancerous polyps or small growths are found.   Talk with your health care provider about prostate cancer screening.  Testicular cancer screening isrecommended for adult males. Screening includes self-exam, a health care provider exam, and other screening tests. Consult with your health care provider about any symptoms you have or any concerns you have about testicular cancer.  Use sunscreen. Apply sunscreen liberally and repeatedly throughout the day. You should  seek shade when your shadow is shorter than you. Protect yourself by wearing long sleeves, pants, a wide-brimmed hat, and sunglasses year round, whenever you are outdoors.  Once a month, do a whole-body skin exam, using a mirror to look at the skin on your back. Tell your health care provider about new moles, moles that have irregular borders, moles that are larger than a pencil eraser, or moles that have changed in shape or color.  Stay current with required vaccines (immunizations).  Influenza vaccine. All adults should be immunized every year.  Tetanus, diphtheria, and acellular pertussis (Td, Tdap) vaccine. An adult who has not previously received Tdap or who does not know his vaccine status should receive 1 dose of Tdap. This initial dose should be followed by tetanus and diphtheria toxoids (Td) booster doses every 10 years. Adults with an unknown or incomplete history of completing a 3-dose immunization series with Td-containing vaccines should begin or complete a primary immunization series including a Tdap dose. Adults should receive a Td booster every 10 years.  Varicella vaccine. An adult without evidence of immunity to varicella should receive 2 doses or a second dose if he has previously received 1 dose.  Human papillomavirus (HPV) vaccine. Males aged 13-21 years who have not received the vaccine previously should receive the 3-dose series. Males aged 22-26 years may be immunized. Immunization is recommended through the age of 26 years for any male who has sex with males and did not get any or all doses earlier. Immunization is recommended for any person with an immunocompromised condition through the age of 26 years if he did not get any or all doses earlier. During the 3-dose series, the second dose should be obtained 4-8 weeks after the first dose. The third dose should be obtained 24 weeks after the first dose and 16 weeks after the second dose.  Zoster vaccine. One dose is recommended  for adults aged 60 years or older unless certain conditions are present.    PREVNAR  - Pneumococcal 13-valent conjugate (PCV13) vaccine. When indicated, a person who is uncertain of his immunization history and has no record of immunization should receive the PCV13 vaccine. An adult aged 19 years or older who has certain medical conditions and has not been previously immunized should receive 1 dose of PCV13 vaccine. This PCV13 should be followed with a dose of pneumococcal polysaccharide (PPSV23) vaccine. The PPSV23 vaccine dose should be obtained at least 8 weeks after the dose of PCV13 vaccine. An adult aged 19 years or older who has certain medical conditions and previously   received 1 or more doses of PPSV23 vaccine should receive 1 dose of PCV13. The PCV13 vaccine dose should be obtained 1 or more years after the last PPSV23 vaccine dose.    PNEUMOVAX - Pneumococcal polysaccharide (PPSV23) vaccine. When PCV13 is also indicated, PCV13 should be obtained first. All adults aged 65 years and older should be immunized. An adult younger than age 65 years who has certain medical conditions should be immunized. Any person who resides in a nursing home or long-term care facility should be immunized. An adult smoker should be immunized. People with an immunocompromised condition and certain other conditions should receive both PCV13 and PPSV23 vaccines. People with human immunodeficiency virus (HIV) infection should be immunized as soon as possible after diagnosis. Immunization during chemotherapy or radiation therapy should be avoided. Routine use of PPSV23 vaccine is not recommended for American Indians, [place] Natives, or people younger than 65 years unless there are medical conditions that require PPSV23 vaccine. When indicated, people who have unknown immunization and have no record of immunization should receive PPSV23 vaccine. One-time revaccination 5 years after the first dose of PPSV23 is recommended for  people aged 19-64 years who have chronic kidney failure, nephrotic syndrome, asplenia, or immunocompromised conditions. People who received 1-2 doses of PPSV23 before age 65 years should receive another dose of PPSV23 vaccine at age 65 years or later if at least 5 years have passed since the previous dose. Doses of PPSV23 are not needed for people immunized with PPSV23 at or after age 65 years.    Hepatitis A vaccine. Adults who wish to be protected from this disease, have certain high-risk conditions, work with hepatitis A-infected animals, work in hepatitis A research labs, or travel to or work in countries with a high rate of hepatitis A should be immunized. Adults who were previously unvaccinated and who anticipate close contact with an international adoptee during the first 60 days after arrival in the [place] from a country with a high rate of hepatitis A should be immunized.    Hepatitis B vaccine. Adults should be immunized if they wish to be protected from this disease, have certain high-risk conditions, may be exposed to blood or other infectious body fluids, are household contacts or sex partners of hepatitis B positive people, are clients or workers in certain care facilities, or travel to or work in countries with a high rate of hepatitis B.   Preventive Service / Frequency   Ages 40 to 64  Blood pressure check.  Lipid and cholesterol check  Lung cancer screening. / Every year if you are aged 55-80 years and have a 30-pack-year history of smoking and currently smoke or have quit within the past 15 years. Yearly screening is stopped once you have quit smoking for at least 15 years or develop a health problem that would prevent you from having lung cancer treatment.  Fecal occult blood test (FOBT) of stool. / Every year beginning at age 50 and continuing until age 75. You may not have to do this test if you get a colonoscopy every 10 years.  Flexible sigmoidoscopy** or  colonoscopy.** / Every 5 years for a flexible sigmoidoscopy or every 10 years for a colonoscopy beginning at age 50 and continuing until age 75. Screening for abdominal aortic aneurysm (AAA)  by ultrasound is recommended for people who have history of high blood pressure or who are current or former smokers. 

## 2014-05-20 LAB — HEPATIC FUNCTION PANEL
ALT: 29 U/L (ref 0–53)
AST: 29 U/L (ref 0–37)
Albumin: 4.3 g/dL (ref 3.5–5.2)
Alkaline Phosphatase: 71 U/L (ref 39–117)
BILIRUBIN DIRECT: 0.1 mg/dL (ref 0.0–0.3)
BILIRUBIN INDIRECT: 0.3 mg/dL (ref 0.2–1.2)
Total Bilirubin: 0.4 mg/dL (ref 0.2–1.2)
Total Protein: 6.6 g/dL (ref 6.0–8.3)

## 2014-05-20 LAB — BASIC METABOLIC PANEL WITH GFR
BUN: 15 mg/dL (ref 6–23)
CHLORIDE: 103 meq/L (ref 96–112)
CO2: 26 mEq/L (ref 19–32)
Calcium: 9.6 mg/dL (ref 8.4–10.5)
Creat: 0.95 mg/dL (ref 0.50–1.35)
GFR, Est African American: >89 mL/min
GFR, Est Non African American: >89 mL/min
GLUCOSE: 95 mg/dL (ref 70–99)
POTASSIUM: 3.7 meq/L (ref 3.5–5.3)
Sodium: 140 mEq/L (ref 135–145)

## 2014-05-20 LAB — URINALYSIS, MICROSCOPIC ONLY
Bacteria, UA: NONE SEEN
CASTS: NONE SEEN
CRYSTALS: NONE SEEN
SQUAMOUS EPITHELIAL / LPF: NONE SEEN

## 2014-05-20 LAB — LIPID PANEL
CHOL/HDL RATIO: 3.1 ratio
Cholesterol: 121 mg/dL (ref 0–200)
HDL: 39 mg/dL — ABNORMAL LOW (ref >39)
LDL CALC: 46 mg/dL (ref 0–99)
Triglycerides: 181 mg/dL — ABNORMAL HIGH (ref <150)
VLDL: 36 mg/dL (ref 0–40)

## 2014-05-20 LAB — IRON AND TIBC
%SAT: 27 % (ref 20–55)
Iron: 88 ug/dL (ref 42–165)
TIBC: 324 ug/dL (ref 215–435)
UIBC: 236 ug/dL (ref 125–400)

## 2014-05-20 LAB — VITAMIN D 25 HYDROXY (VIT D DEFICIENCY, FRACTURES): Vit D, 25-Hydroxy: 47 ng/mL (ref 30–100)

## 2014-05-20 LAB — MICROALBUMIN / CREATININE URINE RATIO
Creatinine, Urine: 190.9 mg/dL
MICROALB/CREAT RATIO: 5.2 mg/g (ref 0.0–30.0)
Microalb, Ur: 1 mg/dL (ref <2.0)

## 2014-05-20 LAB — TSH: TSH: 1.191 u[IU]/mL (ref 0.350–4.500)

## 2014-05-20 LAB — PSA: PSA: 0.82 ng/mL (ref <=4.00)

## 2014-05-20 LAB — VITAMIN B12: VITAMIN B 12: 538 pg/mL (ref 211–911)

## 2014-05-20 LAB — HEMOGLOBIN A1C
HEMOGLOBIN A1C: 5.6 % (ref <5.7)
Mean Plasma Glucose: 114 mg/dL (ref <117)

## 2014-05-20 LAB — TESTOSTERONE: TESTOSTERONE: 290 ng/dL — AB (ref 300–890)

## 2014-05-20 LAB — INSULIN, FASTING: INSULIN FASTING, SERUM: 111.8 u[IU]/mL — AB (ref 2.0–19.6)

## 2014-05-20 LAB — MAGNESIUM: MAGNESIUM: 1.9 mg/dL (ref 1.5–2.5)

## 2014-07-21 ENCOUNTER — Telehealth: Payer: Self-pay | Admitting: Physician Assistant

## 2014-07-21 NOTE — Telephone Encounter (Signed)
Left message to call our office for sleep study findings or call [place] [facility] [phone].  Thank you, Katrina Judeth Horn [facility] [phone] Fax [phone]

## 2014-07-21 NOTE — Telephone Encounter (Signed)
Left message to call for sleep study findings or call [facility] [phone]  Thank you, Katrina Judeth Horn [facility] [phone] Fax [phone]

## 2014-07-27 ENCOUNTER — Encounter: Payer: Self-pay | Admitting: Internal Medicine

## 2014-07-27 ENCOUNTER — Ambulatory Visit (INDEPENDENT_AMBULATORY_CARE_PROVIDER_SITE_OTHER): Payer: 59 | Admitting: Internal Medicine

## 2014-07-27 VITALS — BP 116/76 | HR 64 | Temp 97.3°F | Resp 16 | Ht 69.0 in | Wt 223.0 lb

## 2014-07-27 DIAGNOSIS — J0140 Acute pansinusitis, unspecified: Secondary | ICD-10-CM

## 2014-07-27 MED ORDER — LEVOFLOXACIN 500 MG PO TABS: ORAL_TABLET | ORAL | Status: DC

## 2014-07-27 MED ORDER — PREDNISONE 20 MG PO TABS: ORAL_TABLET | ORAL | Status: DC

## 2014-07-27 NOTE — Progress Notes (Signed)
   Subjective:    Patient ID: Javier Vega, male    DOB: 21-Jan-1961, 54 y.o.   MRN: [id number]  HPI Nice 54 yo MWM with prior hx/o sinusutis presents with his usual sx's of frontal/maxillary pressure/pain , nasal congestion, dry cough, and ? of low grade fever.  Outpatient Prescriptions Prior to Visit  Medication Sig Dispense Refill  . Ascorbic Acid (VITAMIN C PO) Take by mouth daily.    Marland Kitchen aspirin 81 MG chewable tablet Chew 81 mg by mouth daily.    Marland Kitchen atorvastatin (LIPITOR) 80 MG tablet Take 1/2 to 1 tablet daily as directed for choleterol 30 tablet 99  . Cholecalciferol (VITAMIN D PO) Take 8,000 Int'l Units by mouth daily.    . Cyanocobalamin (VITAMIN B 12 PO) Take by mouth daily.    . Multiple Vitamins-Minerals (MENS MULTIVITAMIN PLUS) TABS Take by mouth daily.    . Omega-3 Fatty Acids (FISH OIL PO) Take by mouth 3 (three) times daily.      No facility-administered medications prior to visit.   Allergies  Allergen Reactions  . Crestor [Rosuvastatin]    Past Medical History  Diagnosis Date  . Hyperlipidemia   . Hypertension   . Vitamin D deficiency    Review of Systems 10 point systems review negative except as above.    Objective:   Physical Exam BP 116/76 mmHg  Pulse 64  Temp(Src) 97.3 F (36.3 C)  Resp 16  Ht 5\' 9"  (1.753 m)  Wt 223 lb (101.152 kg)  BMI 32.92 kg/m2  HEENT - Eac's patent. TM's Nl. EOM's full. PERRLA.(+) tender bilat frontal/maxillary areas.  NasoOroPharynx clear. Neck - supple. Nl Thyroid. Carotids 2+ & No bruits, nodes, JVD Chest - Clear equal BS w/o Rales, rhonchi, wheezes. Cor - Nl HS. RRR w/o sig MGR. PP 1(+). No edema. Abd - No palpable organomegaly, masses or tenderness. BS nl. MS- FROM w/o deformities. Muscle power, tone and bulk Nl. Gait Nl. Neuro - No obvious Cr N abnormalities. Sensory, motor and Cerebellar functions appear Nl w/o focal abnormalities. Psyche - Mental status normal & appropriate.  No delusions, ideations or obvious mood  abnormalities. Skin - negative.    Assessment & Plan:   1. Acute pansinusitis, recurrence not specified  - Levaquin 500 mg #15 x 1 rf  - Prednisone 20 mg #20  - Pulse/taper  - Hot steam/showers  - discussed meds / SE

## 2014-08-18 ENCOUNTER — Ambulatory Visit (INDEPENDENT_AMBULATORY_CARE_PROVIDER_SITE_OTHER): Payer: 59 | Admitting: Physician Assistant

## 2014-08-18 ENCOUNTER — Encounter: Payer: Self-pay | Admitting: Physician Assistant

## 2014-08-18 VITALS — BP 132/82 | HR 76 | Temp 97.8°F | Resp 16 | Ht 69.0 in | Wt 212.0 lb

## 2014-08-18 DIAGNOSIS — E669 Obesity, unspecified: Secondary | ICD-10-CM

## 2014-08-18 DIAGNOSIS — R197 Diarrhea, unspecified: Secondary | ICD-10-CM

## 2014-08-18 DIAGNOSIS — E559 Vitamin D deficiency, unspecified: Secondary | ICD-10-CM

## 2014-08-18 DIAGNOSIS — R7309 Other abnormal glucose: Secondary | ICD-10-CM

## 2014-08-18 DIAGNOSIS — E291 Testicular hypofunction: Secondary | ICD-10-CM

## 2014-08-18 DIAGNOSIS — E785 Hyperlipidemia, unspecified: Secondary | ICD-10-CM

## 2014-08-18 DIAGNOSIS — I10 Essential (primary) hypertension: Secondary | ICD-10-CM

## 2014-08-18 DIAGNOSIS — R7303 Prediabetes: Secondary | ICD-10-CM

## 2014-08-18 DIAGNOSIS — F17200 Nicotine dependence, unspecified, uncomplicated: Secondary | ICD-10-CM

## 2014-08-18 DIAGNOSIS — G4733 Obstructive sleep apnea (adult) (pediatric): Secondary | ICD-10-CM

## 2014-08-18 DIAGNOSIS — Z9989 Dependence on other enabling machines and devices: Secondary | ICD-10-CM

## 2014-08-18 DIAGNOSIS — Z79899 Other long term (current) drug therapy: Secondary | ICD-10-CM

## 2014-08-18 DIAGNOSIS — R634 Abnormal weight loss: Secondary | ICD-10-CM

## 2014-08-18 MED ORDER — PREDNISONE 20 MG PO TABS: ORAL_TABLET | ORAL | Status: DC

## 2014-08-18 NOTE — Patient Instructions (Addendum)
Please pick one of the over the counter allergy medications below and take it once daily for allergies.  Claritin or loratadine cheapest but likely the weakest  Zyrtec or certizine at night because it can make you sleepy The strongest is allegra or fexafinadine  Cheapest at [employer], [employer], [employer]   Colon cancer is 3rd most diagnosed cancer and 2nd leading cause of death in both men and women 54 years of age and older despite being one of the most preventable and treatable cancers if found early.  Please get your colon study.    9 Ways to Naturally Increase Testosterone Levels  1.   Lose Weight If you're overweight, shedding the excess pounds may increase your testosterone levels, according to research presented at the Endocrine Society's 2012 meeting. Overweight men are more likely to have low testosterone levels to begin with, so this is an important trick to increase your body's testosterone production when you need it most.  2.   High-Intensity Exercise like Peak Fitness  Short intense exercise has a proven positive effect on increasing testosterone levels and preventing its decline. That's unlike aerobics or prolonged moderate exercise, which have shown to have negative or no effect on testosterone levels. Having a whey protein meal after exercise can further enhance the satiety/testosterone-boosting impact (hunger hormones cause the opposite effect on your testosterone and libido). Here's a summary of what a typical high-intensity [employer] Fitness routine might look like: " Warm up for three minutes  " Exercise as hard and fast as you can for 30 seconds. You should feel like you couldn't possibly go on another few seconds  " Recover at a slow to moderate pace for 90 seconds  " Repeat the high intensity exercise and recovery 7 more times .  3.   Consume Plenty of Zinc The mineral zinc is important for testosterone production, and supplementing your diet for as little as six weeks has been  shown to cause a marked improvement in testosterone among men with low levels.1 Likewise, research has shown that restricting dietary sources of zinc leads to a significant decrease in testosterone, while zinc supplementation increases it2 -- and even protects men from exercised-induced reductions in testosterone levels.3 It's estimated that up to 22 percent of adults over the age of 60 may have lower than recommended zinc intakes; even when dietary supplements were added in, an estimated 20-25 percent of older adults still had inadequate zinc intakes, according to a [employer] and Nutrition Examination Survey.4 Your diet is the best source of zinc; along with protein-rich foods like meats and fish, other good dietary sources of zinc include raw milk, raw cheese, beans, and yogurt or kefir made from raw milk. It can be difficult to obtain enough dietary zinc if you're a vegetarian, and also for meat-eaters as well, largely because of conventional farming methods that rely heavily on chemical fertilizers and pesticides. These chemicals deplete the soil of nutrients ... nutrients like zinc that must be absorbed by plants in order to be passed on to you. In many cases, you may further deplete the nutrients in your food by the way you prepare it. For most food, cooking it will drastically reduce its levels of nutrients like zinc . particularly over-cooking, which many people do. If you decide to use a zinc supplement, stick to a dosage of less than 40 mg a day, as this is the recommended adult upper limit. Taking too much zinc can interfere with your body's ability to absorb other  minerals, especially copper, and may cause nausea as a side effect.  4.   Strength Training In addition to Peak Fitness, strength training is also known to boost testosterone levels, provided you are doing so intensely enough. When strength training to boost testosterone, you'll want to increase the weight and lower your number of  reps, and then focus on exercises that work a large number of muscles, such as dead lifts or squats.  You can "turbo-charge" your weight training by going slower. By slowing down your movement, you're actually turning it into a high-intensity exercise. Super Slow movement allows your muscle, at the microscopic level, to access the maximum number of cross-bridges between the protein filaments that produce movement in the muscle.   5.   Optimize Your Vitamin D Levels Vitamin D, a steroid hormone, is essential for the healthy development of the nucleus of the sperm cell, and helps maintain semen quality and sperm count. Vitamin D also increases levels of testosterone, which may boost libido. In one study, overweight men who were given vitamin D supplements had a significant increase in testosterone levels after one year.5   6.   Reduce Stress When you're under a lot of stress, your body releases high levels of the stress hormone cortisol. This hormone actually blocks the effects of testosterone,6 presumably because, from a biological standpoint, testosterone-associated behaviors (mating, competing, aggression) may have lowered your chances of survival in an emergency (hence, the "fight or flight" response is dominant, courtesy of cortisol).  7.   Limit or Eliminate Sugar from Your Diet Testosterone levels decrease after you eat sugar, which is likely because the sugar leads to a high insulin level, another factor leading to low testosterone.7 Based on USDA estimates, the average American consumes 12 teaspoons of sugar a day, which equates to about TWO TONS of sugar during a lifetime.  8.   Eat Healthy Fats By healthy, this means not only mon- and polyunsaturated fats, like that found in avocadoes and nuts, but also saturated, as these are essential for building testosterone. Research shows that a diet with less than 40 percent of energy as fat (and that mainly from animal sources, i.e. saturated) lead to  a decrease in testosterone levels.8 My personal diet is about 60-70 percent healthy fat, and other experts agree that the ideal diet includes somewhere between 50-70 percent fat.  It's important to understand that your body requires saturated fats from animal and vegetable sources (such as meat, dairy, certain oils, and tropical plants like coconut) for optimal functioning, and if you neglect this important food group in favor of sugar, grains and other starchy carbs, your health and weight are almost guaranteed to suffer. Examples of healthy fats you can eat more of to give your testosterone levels a boost include: Olives and Olive oil  Coconuts and coconut oil Butter made from raw grass-fed organic milk Raw nuts, such as, almonds or pecans Organic pastured egg yolks Avocados Grass-fed meats Palm oil Unheated organic nut oils   9.   Boost Your Intake of Branch Chain Amino Acids (BCAA) from Foods Like [place] suggests that BCAAs result in higher testosterone levels, particularly when taken along with resistance training.9 While BCAAs are available in supplement form, you'll find the highest concentrations of BCAAs like leucine in dairy products - especially quality cheeses and whey protein. Even when getting leucine from your natural food supply, it's often wasted or used as a building block instead of an anabolic agent. So to  create the correct anabolic environment, you need to boost leucine consumption way beyond mere maintenance levels. That said, keep in mind that using leucine as a free form amino acid can be highly counterproductive as when free form amino acids are artificially administrated, they rapidly enter your circulation while disrupting insulin function, and impairing your body's glycemic control. Food-based leucine is really the ideal form that can benefit your muscles without side effects.

## 2014-08-18 NOTE — Progress Notes (Signed)
Assessment and Plan:  1. Hypertension -Continue medication, monitor blood pressure at home. Continue DASH diet.  Reminder to go to the ER if any CP, SOB, nausea, dizziness, severe HA, changes vision/speech, left arm numbness and tingling and jaw pain.  2. Cholesterol -Continue diet and exercise. Check cholesterol. - can cut 1/2 and do every other day.    3. Prediabetes  -Continue diet and exercise. Check A1C  4. Vitamin D Def - check level and continue medications.   5. Obesity with co morbidities-  long discussion about weight loss, diet, and exercise  6. Diarrhea, weight loss Weight loss is likely due to portion control and exercise but with diarrhea and over due for colonoscopy will send to GI.   Continue diet and meds as discussed. Further disposition pending results of labs. Over 30 minutes of exam, counseling, chart review, and critical decision making was performed  HPI 54 y.o. male  presents for 3 month follow up on hypertension, cholesterol, prediabetes, and vitamin D deficiency.   His blood pressure has been controlled at home, today their BP is BP: 132/82 mmHg  He does not workout but has been working out side. He denies chest pain, shortness of breath, dizziness. Moving into his new house next week.   He is on cholesterol medication, lipitor '80mg'$  1/2 pill  and denies myalgias. His cholesterol is at goal. The cholesterol last visit was:   Lab Results  Component Value Date   CHOL 121 05/19/2014   HDL 39* 05/19/2014   LDLCALC 46 05/19/2014   TRIG 181* 05/19/2014   CHOLHDL 3.1 05/19/2014   He has been working on diet and exercise for prediabetes, has been elevated in the past, and denies paresthesia of the feet, polydipsia, polyuria and visual disturbances. Last A1C in the office was:  Lab Results  Component Value Date   HGBA1C 5.6 05/19/2014  Patient is on Vitamin D supplement.   Lab Results  Component Value Date   VD25OH 47 05/19/2014   BMI is Body mass index is  31.29 kg/(m^2)., he is working on diet and exercise, decreasing portions and increasing water, increasing activity. + OSA and is on CPAP, is getting a new one. He has however also had diarrhea intermittently for 3 months, light tan stool without blood, no fever, chills, Ab pain. Has never had colonoscopy.  Wt Readings from Last 3 Encounters:  08/18/14 212 lb (96.163 kg)  07/27/14 223 lb (101.152 kg)  05/19/14 231 lb 3.2 oz (104.872 kg)      Current Medications:  Current Outpatient Prescriptions on File Prior to Visit  Medication Sig Dispense Refill  . Ascorbic Acid (VITAMIN C PO) Take by mouth daily.    Marland Kitchen aspirin 81 MG chewable tablet Chew 81 mg by mouth daily.    Marland Kitchen atorvastatin (LIPITOR) 80 MG tablet Take 1/2 to 1 tablet daily as directed for choleterol 30 tablet 99  . Cholecalciferol (VITAMIN D PO) Take 8,000 Int'l Units by mouth daily.    . Cyanocobalamin (VITAMIN B 12 PO) Take by mouth daily.    Marland Kitchen levofloxacin (LEVAQUIN) 500 MG tablet Take 1 tablet daily with food for infection 15 tablet 1  . Multiple Vitamins-Minerals (MENS MULTIVITAMIN PLUS) TABS Take by mouth daily.    . Omega-3 Fatty Acids (FISH OIL PO) Take by mouth 3 (three) times daily.     . predniSONE (DELTASONE) 20 MG tablet 1 tab 3 x day for 3 days, then 1 tab 2 x day for 3 days, then  1 tab 1 x day for 5 days 20 tablet 0   No current facility-administered medications on file prior to visit.   Medical History:  Patient Active Problem List   Diagnosis Date Noted  . OSA on CPAP 08/18/2014  . Obesity 02/15/2014  . Tobacco use disorder 02/15/2014  . Encounter for long-term (current) use of medications 10/08/2013  . Prediabetes 05/18/2013  . Essential hypertension 05/18/2013  . Hyperlipidemia   . Vitamin D deficiency    Allergies:  Allergies  Allergen Reactions  . Crestor [Rosuvastatin]      Review of Systems:  Review of Systems  Constitutional: Positive for weight loss and malaise/fatigue. Negative for fever,  chills and diaphoresis.  HENT: Positive for congestion and sore throat. Negative for ear discharge, ear pain, hearing loss, nosebleeds and tinnitus.   Eyes: Negative.   Respiratory: Negative.  Negative for stridor.   Cardiovascular: Negative.   Gastrointestinal: Positive for diarrhea. Negative for heartburn, nausea, vomiting, abdominal pain, constipation, blood in stool and melena.  Genitourinary: Negative.   Musculoskeletal: Negative.   Skin: Negative.   Neurological: Negative.  Negative for weakness and headaches.  Endo/Heme/Allergies: Negative.   Psychiatric/Behavioral: Negative.     Family history- Review and unchanged Social history- Review and unchanged Physical Exam: BP 132/82 mmHg  Pulse 76  Temp(Src) 97.8 F (36.6 C)  Resp 16  Ht '5\' 9"'$  (1.753 m)  Wt 212 lb (96.163 kg)  BMI 31.29 kg/m2 Wt Readings from Last 3 Encounters:  08/18/14 212 lb (96.163 kg)  07/27/14 223 lb (101.152 kg)  05/19/14 231 lb 3.2 oz (104.872 kg)   General Appearance: Well nourished, in no apparent distress. Eyes: PERRLA, EOMs, conjunctiva no swelling or erythema Sinuses: No Frontal/maxillary tenderness ENT/Mouth: Ext aud canals clear, TMs without erythema, bulging. No erythema, swelling, or exudate on post pharynx.  Tonsils not swollen or erythematous. Hearing normal.  Neck: Supple, thyroid normal.  Respiratory: Respiratory effort normal, BS equal bilaterally without rales, rhonchi, wheezing or stridor.  Cardio: RRR with no MRGs. Brisk peripheral pulses without edema.  Abdomen: Soft, + BS,  Non tender, no guarding, rebound, hernias, masses. Lymphatics: Non tender without lymphadenopathy.  Musculoskeletal: Full ROM, 5/5 strength, Normal gait Skin: Warm, dry without rashes, lesions, ecchymosis.  Neuro: Cranial nerves intact. Normal muscle tone, no cerebellar symptoms. Psych: Awake and oriented X 3, normal affect, Insight and Judgment appropriate.    Vicie Mutters, PA-C 4:44 PM [facility]  Adult & Adolescent Internal Medicine

## 2014-08-19 LAB — CBC WITH DIFFERENTIAL/PLATELET
Basophils Absolute: 0 10*3/uL (ref 0.0–0.1)
Basophils Relative: 0 % (ref 0–1)
EOS PCT: 3 % (ref 0–5)
Eosinophils Absolute: 0.3 10*3/uL (ref 0.0–0.7)
HEMATOCRIT: 49.4 % (ref 39.0–52.0)
Hemoglobin: 16.7 g/dL (ref 13.0–17.0)
LYMPHS ABS: 3.4 10*3/uL (ref 0.7–4.0)
Lymphocytes Relative: 34 % (ref 12–46)
MCH: 31.2 pg (ref 26.0–34.0)
MCHC: 33.8 g/dL (ref 30.0–36.0)
MCV: 92.3 fL (ref 78.0–100.0)
MONOS PCT: 7 % (ref 3–12)
MPV: 10.2 fL (ref 8.6–12.4)
Monocytes Absolute: 0.7 10*3/uL (ref 0.1–1.0)
Neutro Abs: 5.5 10*3/uL (ref 1.7–7.7)
Neutrophils Relative %: 56 % (ref 43–77)
Platelets: 205 10*3/uL (ref 150–400)
RBC: 5.35 MIL/uL (ref 4.22–5.81)
RDW: 13.1 % (ref 11.5–15.5)
WBC: 9.9 10*3/uL (ref 4.0–10.5)

## 2014-08-19 LAB — LIPID PANEL
Cholesterol: 147 mg/dL (ref 0–200)
HDL: 38 mg/dL — AB (ref >=40)
LDL CALC: 68 mg/dL (ref 0–99)
Total CHOL/HDL Ratio: 3.9 Ratio
Triglycerides: 204 mg/dL — ABNORMAL HIGH (ref <150)
VLDL: 41 mg/dL — ABNORMAL HIGH (ref 0–40)

## 2014-08-19 LAB — BASIC METABOLIC PANEL WITH GFR
BUN: 24 mg/dL — AB (ref 6–23)
CHLORIDE: 105 meq/L (ref 96–112)
CO2: 25 mEq/L (ref 19–32)
CREATININE: 0.81 mg/dL (ref 0.50–1.35)
Calcium: 9.4 mg/dL (ref 8.4–10.5)
GFR, Est African American: >89 mL/min
GFR, Est Non African American: >89 mL/min
GLUCOSE: 98 mg/dL (ref 70–99)
Potassium: 3.8 mEq/L (ref 3.5–5.3)
SODIUM: 138 meq/L (ref 135–145)

## 2014-08-19 LAB — TSH: TSH: 1.409 u[IU]/mL (ref 0.350–4.500)

## 2014-08-19 LAB — HEPATIC FUNCTION PANEL
ALBUMIN: 4.6 g/dL (ref 3.5–5.2)
ALT: 22 U/L (ref 0–53)
AST: 22 U/L (ref 0–37)
Alkaline Phosphatase: 68 U/L (ref 39–117)
Bilirubin, Direct: 0.1 mg/dL (ref 0.0–0.3)
Indirect Bilirubin: 0.3 mg/dL (ref 0.2–1.2)
TOTAL PROTEIN: 6.6 g/dL (ref 6.0–8.3)
Total Bilirubin: 0.4 mg/dL (ref 0.2–1.2)

## 2014-08-19 LAB — TESTOSTERONE: Testosterone: 321 ng/dL (ref 300–890)

## 2014-08-19 LAB — MAGNESIUM: Magnesium: 2.1 mg/dL (ref 1.5–2.5)

## 2014-08-19 LAB — VITAMIN D 25 HYDROXY (VIT D DEFICIENCY, FRACTURES): Vit D, 25-Hydroxy: 35 ng/mL (ref 30–100)

## 2014-08-24 ENCOUNTER — Ambulatory Visit (INDEPENDENT_AMBULATORY_CARE_PROVIDER_SITE_OTHER): Payer: 59 | Admitting: Physician Assistant

## 2014-08-24 ENCOUNTER — Encounter: Payer: Self-pay | Admitting: Physician Assistant

## 2014-08-24 VITALS — BP 132/80 | HR 80 | Temp 97.7°F | Resp 16 | Ht 69.0 in | Wt 217.0 lb

## 2014-08-24 DIAGNOSIS — R1084 Generalized abdominal pain: Secondary | ICD-10-CM

## 2014-08-24 DIAGNOSIS — R1013 Epigastric pain: Secondary | ICD-10-CM

## 2014-08-24 NOTE — Patient Instructions (Signed)
Take omeprazole over the counter for 2 weeks, then go to zantac 150-300 mg at night for 2 weeks, then you can stop.  Avoid alcohol, spicy foods, NSAIDS (aleve, ibuprofen) at this time. See foods below.   Food Choices for Gastroesophageal Reflux Disease When you have gastroesophageal reflux disease (GERD), the foods you eat and your eating habits are very important. Choosing the right foods can help ease the discomfort of GERD. WHAT GENERAL GUIDELINES DO I NEED TO FOLLOW?  Choose fruits, vegetables, whole grains, low-fat dairy products, and low-fat meat, fish, and poultry.  Limit fats such as oils, salad dressings, butter, nuts, and avocado.  Keep a food diary to identify foods that cause symptoms.  Avoid foods that cause reflux. These may be different for different people.  Eat frequent small meals instead of three large meals each day.  Eat your meals slowly, in a relaxed setting.  Limit fried foods.  Cook foods using methods other than frying.  Avoid drinking alcohol.  Avoid drinking large amounts of liquids with your meals.  Avoid bending over or lying down until 2-3 hours after eating. WHAT FOODS ARE NOT RECOMMENDED? The following are some foods and drinks that may worsen your symptoms: Vegetables Tomatoes. Tomato juice. Tomato and spaghetti sauce. Chili peppers. Onion and garlic. Horseradish. Fruits Oranges, grapefruit, and lemon (fruit and juice). Meats High-fat meats, fish, and poultry. This includes hot dogs, ribs, ham, sausage, salami, and bacon. Dairy Whole milk and chocolate milk. Sour cream. Cream. Butter. Ice cream. Cream cheese.  Beverages Coffee and tea, with or without caffeine. Carbonated beverages or energy drinks. Condiments Hot sauce. Barbecue sauce.  Sweets/Desserts Chocolate and cocoa. Donuts. Peppermint and spearmint. Fats and Oils High-fat foods, including [place] fries and potato chips. Other Vinegar. Strong spices, such as black pepper, white  pepper, red pepper, cayenne, curry powder, cloves, ginger, and chili powder.  Abdominal Pain Many things can cause abdominal pain. Usually, abdominal pain is not caused by a disease and will improve without treatment. It can often be observed and treated at home. Your health care provider will do a physical exam and possibly order blood tests and X-rays to help determine the seriousness of your pain. However, in many cases, more time must pass before a clear cause of the pain can be found. Before that point, your health care provider may not know if you need more testing or further treatment. HOME CARE INSTRUCTIONS  Monitor your abdominal pain for any changes. The following actions may help to alleviate any discomfort you are experiencing:  Only take over-the-counter or prescription medicines as directed by your health care provider.  Do not take laxatives unless directed to do so by your health care provider.  Try a clear liquid diet (broth, tea, or water) as directed by your health care provider. Slowly move to a bland diet as tolerated. SEEK MEDICAL CARE IF:  You have unexplained abdominal pain.  You have abdominal pain associated with nausea or diarrhea.  You have pain when you urinate or have a bowel movement.  You experience abdominal pain that wakes you in the night.  You have abdominal pain that is worsened or improved by eating food.  You have abdominal pain that is worsened with eating fatty foods.  You have a fever. SEEK IMMEDIATE MEDICAL CARE IF:   Your pain does not go away within 2 hours.  You keep throwing up (vomiting).  Your pain is felt only in portions of the abdomen, such as the right  side or the left lower portion of the abdomen.  You pass bloody or black tarry stools. MAKE SURE YOU:  Understand these instructions.   Will watch your condition.   Will get help right away if you are not doing well or get worse.  Document Released: 01/16/2005 Document  Revised: 04/13/2013 Document Reviewed: 12/16/2012 [facility] Patient Information 2015 [place], [place]. This information is not intended to replace advice given to you by your health care provider. Make sure you discuss any questions you have with your health care provider.

## 2014-08-24 NOTE — Progress Notes (Signed)
Subjective:    Patient ID: Javier Vega, male    DOB: [birth year], 54 y.o.   MRN: [id number]  HPI 54 y.o. smoking WM with history of obesity, preDM, HTN, chol, Vitamin D presents with AB pain. He was just seen on 08/18/2014 for 3 month OV, at that time he had complained of intermittent diarrhea for 3 months and had benign AB at that time, referred to GI for weight loss and diarrhea over due for colonoscopy. He presents today with AB pain. He has been having diffuse lower AB pain, hearing "gruggling", increase belching, gas, has had nausea with vomiting, general fatigue, BM 1 x a day, loose/brown stool no blood. Denies fever, chills. He has been on Prednisone for his back and levaquin for sinus infection 1 month ago. His wife has had gastroenteritis.   Blood pressure 132/80, pulse 80, temperature 97.7 F (36.5 C), resp. rate 16, height '5\' 9"'$  (1.753 m), weight 217 lb (98.431 kg). Wt Readings from Last 3 Encounters:  08/24/14 217 lb (98.431 kg)  08/18/14 212 lb (96.163 kg)  07/27/14 223 lb (101.152 kg)    Current Outpatient Prescriptions on File Prior to Visit  Medication Sig Dispense Refill  . Ascorbic Acid (VITAMIN C PO) Take by mouth daily.    Marland Kitchen aspirin 81 MG chewable tablet Chew 81 mg by mouth daily.    Marland Kitchen atorvastatin (LIPITOR) 80 MG tablet Take 1/2 to 1 tablet daily as directed for choleterol 30 tablet 99  . Cholecalciferol (VITAMIN D PO) Take 8,000 Int'l Units by mouth daily.    . Cyanocobalamin (VITAMIN B 12 PO) Take by mouth daily.    Marland Kitchen levofloxacin (LEVAQUIN) 500 MG tablet Take 1 tablet daily with food for infection 15 tablet 1  . Multiple Vitamins-Minerals (MENS MULTIVITAMIN PLUS) TABS Take by mouth daily.    . Omega-3 Fatty Acids (FISH OIL PO) Take by mouth 3 (three) times daily.     . predniSONE (DELTASONE) 20 MG tablet 1 tab 3 x day for 3 days, then 1 tab 2 x day for 3 days, then 1 tab 1 x day for 5 days 20 tablet 0   No current facility-administered medications on file prior  to visit.   Past Medical History  Diagnosis Date  . Hyperlipidemia   . Hypertension   . Vitamin D deficiency     Review of Systems  Constitutional: Positive for fatigue. Negative for fever, chills, activity change and appetite change.  HENT: Negative.   Respiratory: Negative.   Cardiovascular: Negative.   Gastrointestinal: Positive for nausea (with beltching) and abdominal pain (diffuse lower AB). Negative for vomiting, diarrhea (loose stools), constipation, blood in stool, abdominal distention, anal bleeding and rectal pain.  Genitourinary: Negative.   Musculoskeletal: Positive for back pain. Negative for myalgias, joint swelling, arthralgias, gait problem, neck pain and neck stiffness.  Neurological: Negative.       Objective:   Physical Exam  Constitutional: He is oriented to person, place, and time. He appears well-developed and well-nourished.  Eyes: Conjunctivae are normal. Pupils are equal, round, and reactive to light.  Neck: Normal range of motion. Neck supple.  Cardiovascular: Normal rate and regular rhythm.   Pulmonary/Chest: Effort normal and breath sounds normal.  Abdominal: Soft. Normal appearance and bowel sounds are normal. There is no hepatosplenomegaly. There is tenderness (worse epigastric, mild diffuse lower AB tenderness) in the right lower quadrant and epigastric area. There is no rigidity, no rebound, no guarding, no CVA tenderness, no tenderness at McBurney's  point and negative Murphy's sign. No hernia.  Lymphadenopathy:    He has no cervical adenopathy.  Neurological: He is alert and oriented to person, place, and time.  Skin: Skin is warm and dry. No rash noted.       Assessment & Plan:  AB pain- epigastric and RLQ without rebound/mcburney's- Gastritis likely from prednisone, will give short course of PPI then switch to zantac Lower AB pain- can be from recent ABX use- get on probiotic, bowel rest Has appointment with Dr. Deatra Ina on the 14th, suggest  EGD as well If AB pain worsens, can not hold things down, call the office or go to ER

## 2014-09-02 ENCOUNTER — Emergency Department (HOSPITAL_COMMUNITY)
Admission: EM | Admit: 2014-09-02 | Discharge: 2014-09-02 | Disposition: A | Discharge status: Home / Self Care | Payer: 59 | Source: Home / Self Care | Attending: Family Medicine | Admitting: Family Medicine

## 2014-09-02 ENCOUNTER — Encounter (HOSPITAL_COMMUNITY): Payer: Self-pay | Admitting: Emergency Medicine

## 2014-09-02 DIAGNOSIS — S46912A Strain of unspecified muscle, fascia and tendon at shoulder and upper arm level, left arm, initial encounter: Secondary | ICD-10-CM | POA: Diagnosis not present

## 2014-09-02 MED ORDER — DICLOFENAC SODIUM 1 % TD GEL: 4.0000 g | Freq: Four times a day (QID) | TRANSDERMAL | Status: DC

## 2014-09-02 MED ORDER — CYCLOBENZAPRINE HCL 5 MG PO TABS: 5.0000 mg | ORAL_TABLET | Freq: Three times a day (TID) | ORAL | Status: DC | PRN

## 2014-09-02 NOTE — ED Notes (Signed)
C/o left shoulder pain onset 10 days; pain radiates down arm and associated w/intermittent numbness Denies inj/trauma but has been restoring a house and believes he over worked his shoulder Pain increases w/activity Alert, no signs of acute distress.

## 2014-09-02 NOTE — Discharge Instructions (Signed)
Heat and medicine as needed, limit activity for few days, see orthopedist for therapy if further problems

## 2014-09-02 NOTE — ED Provider Notes (Signed)
CSN: [id number]     Arrival date & time 09/02/14  0804 History   First MD Initiated Contact with Patient 09/02/14 0831     Chief Complaint  Patient presents with  . Shoulder Pain   (Consider location/radiation/quality/duration/timing/severity/associated sxs/prior Treatment) Patient is a 54 y.o. male presenting with shoulder pain. The history is provided by the patient.  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 Hyperlipidemia   . Hypertension   . Vitamin D deficiency    Past Surgical History  Procedure Laterality Date  . Carpal tunnel release  2002  . Cysto  2002   Family History  Problem Relation Age of Onset  . Adopted: Yes   History  Substance Use Topics  . Smoking status: Current Every Day Smoker -- 0.25 packs/day for 36 years    Types: Cigarettes  . Smokeless tobacco: Not on file     Comment: smokes 5 cigarettes daily  . Alcohol Use: No    Review of Systems  Constitutional: Negative.   Musculoskeletal: Positive for stiffness and neck pain. Negative for back pain, joint swelling and gait problem.    Allergies  Crestor  Home Medications   Prior to Admission medications   Medication Sig Start Date End Date Taking? Authorizing Provider  aspirin 81 MG chewable tablet Chew 81 mg by mouth daily.   Yes  Historical Provider, MD  atorvastatin (LIPITOR) 80 MG tablet Take 1/2 to 1 tablet daily as directed for choleterol 10/10/13 10/11/14 Yes Unk Pinto, MD  Ascorbic Acid (VITAMIN C PO) Take by mouth daily.    Historical Provider, MD  Cholecalciferol (VITAMIN D PO) Take 8,000 Int'l Units by mouth daily.    Historical Provider, MD  Cyanocobalamin (VITAMIN B 12 PO) Take by mouth daily.    Historical Provider, MD  cyclobenzaprine (FLEXERIL) 5 MG tablet Take 1 tablet (5 mg total) by mouth 3 (three) times daily as needed for muscle spasms. 09/02/14   Billy Fischer, MD  diclofenac sodium (VOLTAREN) 1 % GEL Apply 4 g topically 4 (four) times daily. 09/02/14   Billy Fischer, MD  levofloxacin (LEVAQUIN) 500 MG tablet Take 1 tablet daily with food for infection 07/27/14   Unk Pinto, MD  Multiple Vitamins-Minerals (MENS MULTIVITAMIN PLUS) TABS Take by mouth daily.    Historical Provider, MD  Omega-3 Fatty Acids (FISH OIL PO) Take by mouth 3 (three) times daily.     Historical Provider, MD  predniSONE (DELTASONE) 20 MG tablet 1 tab 3 x day for 3 days, then 1 tab 2 x day for 3 days, then 1 tab 1 x day for 5 days 08/18/14   Vicie Mutters, PA-C   BP 119/72 mmHg  Pulse 75  Temp(Src) 98.1 F (36.7 C) (Oral)  Resp 18  SpO2 97%  Physical Exam  Constitutional: He is oriented to person, place, and time. He appears well-developed and well-nourished.  Musculoskeletal: He exhibits tenderness.       Left shoulder: He exhibits tenderness. He exhibits no bony tenderness, no swelling, no effusion, no crepitus and no deformity.       Arms: Neurological: He is alert and oriented to person, place, and time.  Skin: Skin is warm and dry.  Nursing note and vitals reviewed.   ED Course  Procedures (including critical care time) Labs Review Labs Reviewed - No data to display  Imaging Review No results found.   MDM   1. Shoulder strain, left, initial encounter        Billy Fischer, MD 09/02/14 [phone]

## 2014-09-08 ENCOUNTER — Ambulatory Visit (INDEPENDENT_AMBULATORY_CARE_PROVIDER_SITE_OTHER): Payer: 59 | Admitting: Gastroenterology

## 2014-09-08 ENCOUNTER — Encounter: Payer: Self-pay | Admitting: Gastroenterology

## 2014-09-08 VITALS — BP 132/68 | HR 84 | Ht 67.5 in | Wt 216.0 lb

## 2014-09-08 DIAGNOSIS — K625 Hemorrhage of anus and rectum: Secondary | ICD-10-CM | POA: Diagnosis not present

## 2014-09-08 DIAGNOSIS — Z1211 Encounter for screening for malignant neoplasm of colon: Secondary | ICD-10-CM

## 2014-09-08 MED ORDER — NA SULFATE-K SULFATE-MG SULF 17.5-3.13-1.6 GM/177ML PO SOLN: 1.0000 | Freq: Once | ORAL | Status: DC

## 2014-09-08 NOTE — Assessment & Plan Note (Signed)
Plan screening colonoscopy 

## 2014-09-08 NOTE — Patient Instructions (Addendum)

## 2014-09-08 NOTE — Assessment & Plan Note (Signed)
Minimal bleeding is probably due to skin tags, hemorrhoids or possibly a fissure

## 2014-09-08 NOTE — Progress Notes (Signed)
_                                                                                                                History of Present Illness:  Javier Vega is a pleasant 54 year old white male referred at the request of Unk Pinto, MD screening colonoscopy.  He was seen at his PCPs office for nonspecific lower abdominal pain.  This has since resolved.  He has never had a colonoscopy.  He has a history of an anal fissure.  On occasion he will see blood on the toilet tissue.  He denies rectal pain area there is been no change in his bowel habits.    Past Medical History  Diagnosis Date  . Hyperlipidemia   . Hypertension   . Vitamin D deficiency   . Anal fissure   . Arthritis   . Sleep apnea    Past Surgical History  Procedure Laterality Date  . Carpal tunnel release  2002  . Cysto  2002   family history is negative for Colon cancer and Colon polyps. He was adopted. Current Outpatient Prescriptions  Medication Sig Dispense Refill  . Ascorbic Acid (VITAMIN C PO) Take by mouth daily.    Marland Kitchen aspirin 81 MG chewable tablet Chew 81 mg by mouth daily.    Marland Kitchen atorvastatin (LIPITOR) 80 MG tablet Take 1/2 to 1 tablet daily as directed for choleterol 30 tablet 99  . Cholecalciferol (VITAMIN D PO) Take 8,000 Int'l Units by mouth daily.    . Cyanocobalamin (VITAMIN B 12 PO) Take by mouth daily.    . cyclobenzaprine (FLEXERIL) 5 MG tablet Take 1 tablet (5 mg total) by mouth 3 (three) times daily as needed for muscle spasms. 30 tablet 0  . diclofenac sodium (VOLTAREN) 1 % GEL Apply 4 g topically 4 (four) times daily. 100 g 3  . levofloxacin (LEVAQUIN) 500 MG tablet Take 1 tablet daily with food for infection 15 tablet 1  . Multiple Vitamins-Minerals (MENS MULTIVITAMIN PLUS) TABS Take by mouth daily.    . Omega-3 Fatty Acids (FISH OIL PO) Take by mouth 3 (three) times daily.     . predniSONE (DELTASONE) 20 MG tablet 1 tab 3 x day for 3 days, then 1 tab 2 x day for 3 days, then 1 tab 1 x  day for 5 days 20 tablet 0   No current facility-administered medications for this visit.   Allergies as of 09/08/2014 - Review Complete 09/08/2014  Allergen Reaction Noted  . Crestor [rosuvastatin]  05/17/2013    reports that he has been smoking Cigarettes.  He has a 9 pack-year smoking history. He has never used smokeless tobacco. He reports that he drinks alcohol. He reports that he does not use illicit drugs.   Review of Systems: Pertinent positive and negative review of systems were noted in the above HPI section. All other review of systems were otherwise negative.  Vital signs were reviewed in today's medical record Physical Exam: General: Well developed , well nourished, no  acute distress Skin: anicteric Head: Normocephalic and atraumatic Eyes:  sclerae anicteric, EOMI Ears: Normal auditory acuity Mouth: No deformity or lesions Neck: Supple, no masses or thyromegaly Lymph Nodes: no lymphadenopathy Lungs: Clear throughout to auscultation Heart: Regular rate and rhythm; no murmurs, rubs or bruits Gastroinestinal: Soft, non tender and non distended. No masses, hepatosplenomegaly or hernias noted. Normal Bowel sounds Rectal:deferred Musculoskeletal: Symmetrical with no gross deformities  Skin: No lesions on visible extremities Pulses:  Normal pulses noted Extremities: No clubbing, cyanosis, edema or deformities noted Neurological: Alert oriented x 4, grossly nonfocal Cervical Nodes:  No significant cervical adenopathy Inguinal Nodes: No significant inguinal adenopathy Psychological:  Alert and cooperative. Normal mood and affect  See Assessment and Plan under Problem List

## 2014-10-10 ENCOUNTER — Ambulatory Visit: Payer: Self-pay | Admitting: Internal Medicine

## 2014-10-17 ENCOUNTER — Ambulatory Visit: Payer: 59 | Admitting: Internal Medicine

## 2014-11-17 ENCOUNTER — Encounter: Payer: Self-pay | Admitting: Gastroenterology

## 2014-11-17 ENCOUNTER — Ambulatory Visit (AMBULATORY_SURGERY_CENTER): Payer: 59 | Admitting: Gastroenterology

## 2014-11-17 ENCOUNTER — Ambulatory Visit: Payer: Self-pay | Admitting: Internal Medicine

## 2014-11-17 VITALS — BP 128/71 | HR 69 | Temp 99.0°F | Resp 23 | Ht 67.0 in | Wt 216.0 lb

## 2014-11-17 DIAGNOSIS — D124 Benign neoplasm of descending colon: Secondary | ICD-10-CM

## 2014-11-17 DIAGNOSIS — K602 Anal fissure, unspecified: Secondary | ICD-10-CM

## 2014-11-17 DIAGNOSIS — Z1211 Encounter for screening for malignant neoplasm of colon: Secondary | ICD-10-CM

## 2014-11-17 DIAGNOSIS — K648 Other hemorrhoids: Secondary | ICD-10-CM

## 2014-11-17 MED ORDER — SODIUM CHLORIDE 0.9 % IV SOLN: 500.0000 mL | INTRAVENOUS | Status: DC

## 2014-11-17 NOTE — Patient Instructions (Signed)
YOU HAD AN ENDOSCOPIC PROCEDURE TODAY AT [facility]:   Refer to the procedure report that was given to you for any specific questions about what was found during the examination.  If the procedure report does not answer your questions, please call your gastroenterologist to clarify.  If you requested that your care partner not be given the details of your procedure findings, then the procedure report has been included in a sealed envelope for you to review at your convenience later.  YOU SHOULD EXPECT: Some feelings of bloating in the abdomen. Passage of more gas than usual.  Walking can help get rid of the air that was put into your GI tract during the procedure and reduce the bloating. If you had a lower endoscopy (such as a colonoscopy or flexible sigmoidoscopy) you may notice spotting of blood in your stool or on the toilet paper. If you underwent a bowel prep for your procedure, you may not have a normal bowel movement for a few days.  Please Note:  You might notice some irritation and congestion in your nose or some drainage.  This is from the oxygen used during your procedure.  There is no need for concern and it should clear up in a day or so.  SYMPTOMS TO REPORT IMMEDIATELY:   Following lower endoscopy (colonoscopy or flexible sigmoidoscopy):  Excessive amounts of blood in the stool  Significant tenderness or worsening of abdominal pains  Swelling of the abdomen that is new, acute  Fever of 100F or higher   For urgent or emergent issues, a gastroenterologist can be reached at any hour by calling [phone].   DIET: Your first meal following the procedure should be a small meal and then it is ok to progress to your normal diet. Heavy or fried foods are harder to digest and may make you feel nauseous or bloated.  Likewise, meals heavy in dairy and vegetables can increase bloating.  Drink plenty of fluids but you should avoid alcoholic beverages for 24  hours.  ACTIVITY:  You should plan to take it easy for the rest of today and you should NOT DRIVE or use heavy machinery until tomorrow (because of the sedation medicines used during the test).    FOLLOW UP: Our staff will call the number listed on your records the next business day following your procedure to check on you and address any questions or concerns that you may have regarding the information given to you following your procedure. If we do not reach you, we will leave a message.  However, if you are feeling well and you are not experiencing any problems, there is no need to return our call.  We will assume that you have returned to your regular daily activities without incident.  If any biopsies were taken you will be contacted by phone or by letter within the next 1-3 weeks.  Please call us at [phone] if you have not heard about the biopsies in 3 weeks.    SIGNATURES/CONFIDENTIALITY: You and/or your care partner have signed paperwork which will be entered into your electronic medical record.  These signatures attest to the fact that that the information above on your After Visit Summary has been reviewed and is understood.  Full responsibility of the confidentiality of this discharge information lies with you and/or your care-partner.   Handouts were given to your care partner on polyps, hemorrhoids, and a high fiber diet with liberal fluid intake. You may resume your  current medications today. Await biopsy results. Please call if any questions or concerns.

## 2014-11-17 NOTE — Progress Notes (Signed)
Ulice Dash, RN removed IV.  No complaints noted in the recovery room. maw

## 2014-11-17 NOTE — Op Note (Signed)
[place]  [address] [place], [id number]   COLONOSCOPY PROCEDURE REPORT  PATIENT: Javier Vega, Javier Vega  MR#: [id number] BIRTHDATE: [birth year] , 67  yrs. old GENDER: male ENDOSCOPIST: Inda Castle, MD REFERRED EP:PIRJJOA Melford Aase, M.D. PROCEDURE DATE:  11/17/2014 PROCEDURE:   Colonoscopy, screening and Colonoscopy with snare polypectomy First Screening Colonoscopy - Avg.  risk and is 50 yrs.  old or older Yes.  Prior Negative Screening - Now for repeat screening. N/A  History of Adenoma - Now for follow-up colonoscopy & has been > or = to 3 yrs.  N/A  Polyps removed today? Yes ASA CLASS:   Class II INDICATIONS:Colorectal Neoplasm Risk Assessment for this procedure is average risk. MEDICATIONS: Monitored anesthesia care and Propofol 350 mg IV  DESCRIPTION OF PROCEDURE:   After the risks benefits and alternatives of the procedure were thoroughly explained, informed consent was obtained.  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   The [id number]  endoscope was introduced through the anus and advanced to the cecum, which was identified by both the appendix and ileocecal valve. No adverse events experienced.   The quality of the prep was (Suprep was used) good.  The instrument was then slowly withdrawn as the colon was fully examined. Estimated blood loss is zero unless otherwise noted in this procedure report.      COLON FINDINGS: A sessile polyp measuring 12 mm in size was found in the proximal descending colon.  A polypectomy was performed with a cold snare.  The resection was complete, the polyp tissue was completely retrieved and sent to histology.   A sessile polyp measuring 6 mm in size was found in the proximal descending colon. A polypectomy was performed with a cold snare.  The resection was complete, the polyp tissue was completely retrieved and sent to histology.   A sessile polyp measuring 6 mm in size was found  in the distal descending colon.  A polypectomy was performed with a cold snare.  The resection was complete, the polyp tissue was completely retrieved and sent to histology.   Internal hemorrhoids were found.  Retroflexed views revealed no abnormalities. The time to cecum = 3.0 Withdrawal time = 16.4   The scope was withdrawn and the procedure completed. COMPLICATIONS: There were no immediate complications.  ENDOSCOPIC IMPRESSION: 1.   Sessile polyp was found in the proximal descending colon; polypectomy was performed with a cold snare 2.   Sessile polyp was found in the proximal descending colon; polypectomy was performed with a cold snare 3.   Sessile polyp was found in the distal descending colon; polypectomy was performed with a cold snare 4.   Internal hemorrhoids  RECOMMENDATIONS: If the polyp(s) removed today are proven to be adenomatous (pre-cancerous) polyps, you will need a colonoscopy in 3 years. Otherwise you should continue to follow colorectal cancer screening guidelines for "routine risk" patients with a colonoscopy in 10 years.  You will receive a letter within 1-2 weeks with the results of your biopsy as well as final recommendations.  Please call my office if you have not received a letter after 3 weeks.  eSigned:  Inda Castle, MD 11/17/2014 3:33 PM   cc:   PATIENT NAME:  [name], [name] MR#: [id number]

## 2014-11-17 NOTE — Progress Notes (Signed)
Called to room to assist during endoscopic procedure.  Patient ID and intended procedure confirmed with present staff. Received instructions for my participation in the procedure from the performing physician.  

## 2014-11-17 NOTE — Progress Notes (Signed)
Transferred to recovery room. A/O x3, pleased with MAC.  VSS.  Report to Annette, RN. 

## 2014-11-18 ENCOUNTER — Telehealth: Payer: Self-pay | Admitting: *Deleted

## 2014-11-18 NOTE — Telephone Encounter (Signed)
  Follow up Call-  Call back number 11/17/2014  Post procedure Call Back phone  # [phone]  Permission to leave phone message Yes     Patient questions:  Do you have a fever, pain , or abdominal swelling? No. Pain Score  0 *  Have you tolerated food without any problems? Yes.    Have you been able to return to your normal activities? Yes.    Do you have any questions about your discharge instructions: Diet   No. Medications  No. Follow up visit  No.  Do you have questions or concerns about your Care? No.  Actions: * If pain score is 4 or above: No action needed, pain <4.

## 2014-11-23 ENCOUNTER — Encounter: Payer: Self-pay | Admitting: Gastroenterology

## 2014-11-24 ENCOUNTER — Encounter: Payer: Self-pay | Admitting: Gastroenterology

## 2014-11-24 ENCOUNTER — Ambulatory Visit: Payer: Self-pay | Admitting: Physician Assistant

## 2014-12-06 ENCOUNTER — Encounter: Payer: Self-pay | Admitting: Physician Assistant

## 2014-12-06 ENCOUNTER — Ambulatory Visit (INDEPENDENT_AMBULATORY_CARE_PROVIDER_SITE_OTHER): Payer: 59 | Admitting: Physician Assistant

## 2014-12-06 VITALS — BP 128/78 | HR 76 | Temp 97.3°F | Resp 16 | Ht 69.0 in | Wt 225.6 lb

## 2014-12-06 DIAGNOSIS — E669 Obesity, unspecified: Secondary | ICD-10-CM | POA: Diagnosis not present

## 2014-12-06 DIAGNOSIS — E785 Hyperlipidemia, unspecified: Secondary | ICD-10-CM | POA: Diagnosis not present

## 2014-12-06 DIAGNOSIS — R7309 Other abnormal glucose: Secondary | ICD-10-CM

## 2014-12-06 DIAGNOSIS — R7303 Prediabetes: Secondary | ICD-10-CM

## 2014-12-06 DIAGNOSIS — E559 Vitamin D deficiency, unspecified: Secondary | ICD-10-CM

## 2014-12-06 DIAGNOSIS — Z79899 Other long term (current) drug therapy: Secondary | ICD-10-CM

## 2014-12-06 DIAGNOSIS — I10 Essential (primary) hypertension: Secondary | ICD-10-CM

## 2014-12-06 NOTE — Patient Instructions (Signed)
I think it is possible that you have sleep apnea. It can cause interrupted sleep, headaches, frequent awakenings, fatigue, dry mouth, fast/slow heart beats, memory issues, anxiety/depression, swelling, numbness tingling hands/feet, weight gain, shortness of breath, and the list goes on. Sleep apnea needs to be ruled out because if it is left untreated it does eventually lead to abnormal heart beats, lung failure or heart failure as well as increasing the risk of heart attack and stroke. There are masks you can wear OR a mouth piece that I can give you information about. Often times though people feel MUCH better after getting treatment.   Sleep Apnea  Sleep apnea is a sleep disorder characterized by abnormal pauses in breathing while you sleep. When your breathing pauses, the level of oxygen in your blood decreases. This causes you to move out of deep sleep and into light sleep. As a result, your quality of sleep is poor, and the system that carries your blood throughout your body (cardiovascular system) experiences stress. If sleep apnea remains untreated, the following conditions can develop:  High blood pressure (hypertension).  Coronary artery disease.  Inability to achieve or maintain an erection (impotence).  Impairment of your thought process (cognitive dysfunction). There are three types of sleep apnea: 1. Obstructive sleep apnea--Pauses in breathing during sleep because of a blocked airway. 2. Central sleep apnea--Pauses in breathing during sleep because the area of the brain that controls your breathing does not send the correct signals to the muscles that control breathing. 3. Mixed sleep apnea--A combination of both obstructive and central sleep apnea.  RISK FACTORS The following risk factors can increase your risk of developing sleep apnea:  Being overweight.  Smoking.  Having narrow passages in your nose and throat.  Being of older age.  Being male.  Alcohol use.   Sedative and tranquilizer use.  Ethnicity. Among individuals younger than 35 years, African Americans are at increased risk of sleep apnea. SYMPTOMS   Difficulty staying asleep.  Daytime sleepiness and fatigue.  Loss of energy.  Irritability.  Loud, heavy snoring.  Morning headaches.  Trouble concentrating.  Forgetfulness.  Decreased interest in sex. DIAGNOSIS  In order to diagnose sleep apnea, your caregiver will perform a physical examination. Your caregiver may suggest that you take a home sleep test. Your caregiver may also recommend that you spend the night in a sleep lab. In the sleep lab, several monitors record information about your heart, lungs, and brain while you sleep. Your leg and arm movements and blood oxygen level are also recorded. TREATMENT The following actions may help to resolve mild sleep apnea:  Sleeping on your side.   Using a decongestant if you have nasal congestion.   Avoiding the use of depressants, including alcohol, sedatives, and narcotics.   Losing weight and modifying your diet if you are overweight. There also are devices and treatments to help open your airway:  Oral appliances. These are custom-made mouthpieces that shift your lower jaw forward and slightly open your bite. This opens your airway.  Devices that create positive airway pressure. This positive pressure "splints" your airway open to help you breathe better during sleep. The following devices create positive airway pressure:  Continuous positive airway pressure (CPAP) device. The CPAP device creates a continuous level of air pressure with an air pump. The air is delivered to your airway through a mask while you sleep. This continuous pressure keeps your airway open.  Nasal expiratory positive airway pressure (EPAP) device. The EPAP device   creates positive air pressure as you exhale. The device consists of single-use valves, which are inserted into each nostril and held in  place by adhesive. The valves create very little resistance when you inhale but create much more resistance when you exhale. That increased resistance creates the positive airway pressure. This positive pressure while you exhale keeps your airway open, making it easier to breath when you inhale again.  Bilevel positive airway pressure (BPAP) device. The BPAP device is used mainly in patients with central sleep apnea. This device is similar to the CPAP device because it also uses an air pump to deliver continuous air pressure through a mask. However, with the BPAP machine, the pressure is set at two different levels. The pressure when you exhale is lower than the pressure when you inhale.  Surgery. Typically, surgery is only done if you cannot comply with less invasive treatments or if the less invasive treatments do not improve your condition. Surgery involves removing excess tissue in your airway to create a wider passage way. Document Released: 03/29/2002 Document Revised: 08/03/2012 Document Reviewed: 08/15/2011 [employer] Patient Information 2015 [employer]. This information is not intended to replace advice given to you by your health care provider. Make sure you discuss any questions you have with your health care provider.     

## 2014-12-06 NOTE — Progress Notes (Signed)
Assessment and Plan:  1. Hypertension -Continue medication, monitor blood pressure at home. Continue DASH diet.  Reminder to go to the ER if any CP, SOB, nausea, dizziness, severe HA, changes vision/speech, left arm numbness and tingling and jaw pain.  2. Cholesterol -Continue diet and exercise. Check cholesterol. - can cut 1/2 and do every other day.    3. Prediabetes  -Continue diet and exercise. Check A1C  4. Vitamin D Def - check level and continue medications.   5. Obesity with co morbidities-  long discussion about weight loss, diet, and exercise   Continue diet and meds as discussed. Further disposition pending results of labs. Over 30 minutes of exam, counseling, chart review, and critical decision making was performed  HPI 54 y.o. male  presents for 3 month follow up on hypertension, cholesterol, prediabetes, and vitamin D deficiency.   His blood pressure has been controlled at home, today their BP is BP: 128/78 mmHg  He does not workout but has been working out side. He denies chest pain, shortness of breath, dizziness. Moved into new house.   He is on cholesterol medication, lipitor '80mg'$  1/2 pill every other day and denies myalgias. His cholesterol is at goal. The cholesterol last visit was:   Lab Results  Component Value Date   CHOL 147 08/18/2014   HDL 38* 08/18/2014   LDLCALC 68 08/18/2014   TRIG 204* 08/18/2014   CHOLHDL 3.9 08/18/2014   He has been working on diet and exercise for prediabetes, has been elevated in the past, and denies paresthesia of the feet, polydipsia, polyuria and visual disturbances. Last A1C in the office was:  Lab Results  Component Value Date   HGBA1C 5.6 05/19/2014  Patient is on Vitamin D supplement.   Lab Results  Component Value Date   VD25OH 35 08/18/2014   BMI is Body mass index is 33.3 kg/(m^2)., he is working on diet and exercise, + OSA and is on CPAP. Has quit smoking with e cigs.  Wt Readings from Last 3 Encounters:   12/06/14 225 lb 9.6 oz (102.331 kg)  11/17/14 216 lb (97.977 kg)  09/08/14 216 lb (97.977 kg)      Current Medications:  Current Outpatient Prescriptions on File Prior to Visit  Medication Sig Dispense Refill  . Ascorbic Acid (VITAMIN C PO) Take by mouth daily.    Marland Kitchen aspirin 81 MG chewable tablet Chew 81 mg by mouth daily.    Marland Kitchen atorvastatin (LIPITOR) 80 MG tablet Take 1/2 to 1 tablet daily as directed for choleterol 30 tablet 99  . Cholecalciferol (VITAMIN D PO) Take 8,000 Int'l Units by mouth daily.    . Cyanocobalamin (VITAMIN B 12 PO) Take by mouth daily.    . Multiple Vitamins-Minerals (MENS MULTIVITAMIN PLUS) TABS Take by mouth daily.    . Omega-3 Fatty Acids (FISH OIL PO) Take by mouth 3 (three) times daily.      No current facility-administered medications on file prior to visit.   Medical History:  Patient Active Problem List   Diagnosis Date Noted  . Colon cancer screening 09/08/2014  . Anal bleeding 09/08/2014  . OSA on CPAP 08/18/2014  . Obesity 02/15/2014  . Tobacco use disorder 02/15/2014  . Encounter for long-term (current) use of medications 10/08/2013  . Prediabetes 05/18/2013  . Essential hypertension 05/18/2013  . Hyperlipidemia   . Vitamin D deficiency    Allergies:  Allergies  Allergen Reactions  . Crestor [Rosuvastatin]      Review of Systems:  Review of Systems  Constitutional: Negative for fever, chills, weight loss, malaise/fatigue and diaphoresis.  HENT: Negative for congestion, ear discharge, ear pain, hearing loss, nosebleeds, sore throat and tinnitus.   Eyes: Negative.   Respiratory: Negative.  Negative for stridor.   Cardiovascular: Negative.   Gastrointestinal: Negative for heartburn, nausea, vomiting, abdominal pain, diarrhea, constipation, blood in stool and melena.  Genitourinary: Negative.   Musculoskeletal: Positive for back pain. Negative for myalgias, joint pain, falls and neck pain.  Skin: Negative.   Neurological: Negative.   Negative for weakness and headaches.  Endo/Heme/Allergies: Negative.   Psychiatric/Behavioral: Negative.     Family history- Review and unchanged Social history- Review and unchanged Physical Exam: BP 128/78 mmHg  Pulse 76  Temp(Src) 97.3 F (36.3 C)  Resp 16  Ht '5\' 9"'$  (1.753 m)  Wt 225 lb 9.6 oz (102.331 kg)  BMI 33.30 kg/m2 Wt Readings from Last 3 Encounters:  12/06/14 225 lb 9.6 oz (102.331 kg)  11/17/14 216 lb (97.977 kg)  09/08/14 216 lb (97.977 kg)   General Appearance: Well nourished, in no apparent distress. Eyes: PERRLA, EOMs, conjunctiva no swelling or erythema Sinuses: No Frontal/maxillary tenderness ENT/Mouth: Ext aud canals clear, TMs without erythema, bulging. No erythema, swelling, or exudate on post pharynx.  Tonsils not swollen or erythematous. Hearing normal.  Neck: Supple, thyroid normal.  Respiratory: Respiratory effort normal, BS equal bilaterally without rales, rhonchi, wheezing or stridor.  Cardio: RRR with no MRGs. Brisk peripheral pulses without edema.  Abdomen: Soft, + BS,  Non tender, no guarding, rebound, hernias, masses. Lymphatics: Non tender without lymphadenopathy.  Musculoskeletal: Full ROM, 5/5 strength, Normal gait Skin: Warm, dry without rashes, lesions, ecchymosis.  Neuro: Cranial nerves intact. Normal muscle tone, no cerebellar symptoms. Psych: Awake and oriented X 3, normal affect, Insight and Judgment appropriate.    Vicie Mutters, PA-C 4:24 PM [facility]

## 2014-12-07 LAB — LIPID PANEL
CHOL/HDL RATIO: 5 ratio (ref <=5.0)
Cholesterol: 181 mg/dL (ref 125–200)
HDL: 36 mg/dL — ABNORMAL LOW (ref >=40)
LDL CALC: 92 mg/dL (ref <130)
Triglycerides: 266 mg/dL — ABNORMAL HIGH (ref <150)
VLDL: 53 mg/dL — ABNORMAL HIGH (ref <30)

## 2014-12-07 LAB — BASIC METABOLIC PANEL WITH GFR
BUN: 24 mg/dL (ref 7–25)
CHLORIDE: 105 mmol/L (ref 98–110)
CO2: 25 mmol/L (ref 20–31)
Calcium: 9.3 mg/dL (ref 8.6–10.3)
Creat: 0.86 mg/dL (ref 0.70–1.33)
GFR, Est African American: >89 mL/min (ref >=60)
GFR, Est Non African American: >89 mL/min (ref >=60)
Glucose, Bld: 125 mg/dL — ABNORMAL HIGH (ref 65–99)
Potassium: 3.7 mmol/L (ref 3.5–5.3)
SODIUM: 142 mmol/L (ref 135–146)

## 2014-12-07 LAB — INSULIN, FASTING: Insulin fasting, serum: 81.1 u[IU]/mL — ABNORMAL HIGH (ref 2.0–19.6)

## 2014-12-07 LAB — CBC WITH DIFFERENTIAL/PLATELET
BASOS ABS: 0.1 10*3/uL (ref 0.0–0.1)
BASOS PCT: 1 % (ref 0–1)
EOS ABS: 0.2 10*3/uL (ref 0.0–0.7)
Eosinophils Relative: 2 % (ref 0–5)
HCT: 46 % (ref 39.0–52.0)
HEMOGLOBIN: 15.3 g/dL (ref 13.0–17.0)
Lymphocytes Relative: 28 % (ref 12–46)
Lymphs Abs: 2.6 10*3/uL (ref 0.7–4.0)
MCH: 31.2 pg (ref 26.0–34.0)
MCHC: 33.3 g/dL (ref 30.0–36.0)
MCV: 93.9 fL (ref 78.0–100.0)
MPV: 10.1 fL (ref 8.6–12.4)
Monocytes Absolute: 0.7 10*3/uL (ref 0.1–1.0)
Monocytes Relative: 8 % (ref 3–12)
NEUTROS ABS: 5.7 10*3/uL (ref 1.7–7.7)
Neutrophils Relative %: 61 % (ref 43–77)
PLATELETS: 243 10*3/uL (ref 150–400)
RBC: 4.9 MIL/uL (ref 4.22–5.81)
RDW: 13.7 % (ref 11.5–15.5)
WBC: 9.3 10*3/uL (ref 4.0–10.5)

## 2014-12-07 LAB — HEPATIC FUNCTION PANEL
ALK PHOS: 65 U/L (ref 40–115)
ALT: 18 U/L (ref 9–46)
AST: 17 U/L (ref 10–35)
Albumin: 4 g/dL (ref 3.6–5.1)
BILIRUBIN DIRECT: 0.1 mg/dL (ref <=0.2)
BILIRUBIN TOTAL: 0.4 mg/dL (ref 0.2–1.2)
Indirect Bilirubin: 0.3 mg/dL (ref 0.2–1.2)
Total Protein: 6.5 g/dL (ref 6.1–8.1)

## 2014-12-07 LAB — MAGNESIUM: Magnesium: 1.9 mg/dL (ref 1.5–2.5)

## 2014-12-07 LAB — HEMOGLOBIN A1C
HEMOGLOBIN A1C: 5.4 % (ref <5.7)
Mean Plasma Glucose: 108 mg/dL (ref <117)

## 2014-12-07 LAB — VITAMIN D 25 HYDROXY (VIT D DEFICIENCY, FRACTURES): VIT D 25 HYDROXY: 32 ng/mL (ref 30–100)

## 2014-12-07 LAB — TSH: TSH: 1.317 u[IU]/mL (ref 0.350–4.500)

## 2014-12-12 ENCOUNTER — Telehealth: Payer: Self-pay | Admitting: Internal Medicine

## 2014-12-12 NOTE — Telephone Encounter (Signed)
Patient no showed 2 appts with Larkin Ina at [facility]. Patient called today requesting to go by and pick up CPAP, I referred him back to [place] at [place] and [place] at [place]

## 2015-01-30 ENCOUNTER — Encounter: Payer: Self-pay | Admitting: Physician Assistant

## 2015-01-30 ENCOUNTER — Ambulatory Visit (INDEPENDENT_AMBULATORY_CARE_PROVIDER_SITE_OTHER): Payer: 59 | Admitting: Internal Medicine

## 2015-01-30 VITALS — BP 140/90 | HR 86 | Temp 97.9°F | Resp 16 | Ht 69.0 in | Wt 227.0 lb

## 2015-01-30 DIAGNOSIS — B349 Viral infection, unspecified: Secondary | ICD-10-CM

## 2015-01-30 MED ORDER — DICYCLOMINE HCL 20 MG PO TABS: 20.0000 mg | ORAL_TABLET | Freq: Three times a day (TID) | ORAL | Status: DC | PRN

## 2015-01-30 MED ORDER — PREDNISONE 20 MG PO TABS: ORAL_TABLET | ORAL | Status: DC

## 2015-01-30 MED ORDER — AZITHROMYCIN 250 MG PO TABS: ORAL_TABLET | ORAL | Status: DC

## 2015-01-30 NOTE — Progress Notes (Signed)
Subjective:    Patient ID: Javier Vega, male    DOB: 02/04/1961, 54 y.o.   MRN: [id number]  Abdominal Pain Associated symptoms include diarrhea. Pertinent negatives include no constipation, nausea or vomiting.  Patient presents to the office today for left sided jaw pain that has been going on for the last 2 days.  He reports that he did go to the dentist this morning and they weren't able to find anything wrong with his teeth.  He reports that he has had some congestion and coughing.  He reports that he is able to blow out some clear and yellow nasal congestion.  He reports that he normally has some issues that happen approximately this time of the year.  He also reports that he is having some loose stools two episodes today.  He reports that he does have a lot of post nasal drip.  He reports that he has been using his cpap.  He has not taken anything to help.  Son, wife, and daughter all having sinus congestion issues.  He has not been on antibiotics recently.      Review of Systems  Constitutional: Positive for chills and fatigue.  HENT: Positive for congestion, ear pain, postnasal drip, rhinorrhea and sinus pressure. Negative for sore throat, trouble swallowing and voice change.   Eyes: Negative.   Respiratory: Negative for chest tightness and shortness of breath.   Gastrointestinal: Positive for diarrhea. Negative for nausea, vomiting, abdominal pain and constipation.       Objective:   Physical Exam  Constitutional: He is oriented to person, place, and time. He appears well-developed and well-nourished. No distress.  HENT:  Head: Normocephalic.  Nose: Mucosal edema present. Right sinus exhibits maxillary sinus tenderness and frontal sinus tenderness. Left sinus exhibits maxillary sinus tenderness and frontal sinus tenderness.  Mouth/Throat: Oropharynx is clear and moist. No oropharyngeal exudate.  Eyes: Conjunctivae are normal. No scleral icterus.  Neck: Normal range of motion.  Neck supple. No JVD present. No thyromegaly present.  Cardiovascular: Normal rate, regular rhythm, normal heart sounds and intact distal pulses.  Exam reveals no gallop and no friction rub.   No murmur heard. Pulmonary/Chest: Breath sounds normal. No respiratory distress. He has no wheezes. He has no rales. He exhibits no tenderness.  Abdominal: Soft. Bowel sounds are normal. He exhibits no distension and no mass. There is no tenderness. There is no rebound and no guarding.  Musculoskeletal: Normal range of motion.  Lymphadenopathy:    He has no cervical adenopathy.  Neurological: He is alert and oriented to person, place, and time.  Skin: Skin is warm and dry. He is not diaphoretic.  Psychiatric: He has a normal mood and affect. His behavior is normal. Judgment and thought content normal.  Nursing note and vitals reviewed.   Filed Vitals:   01/30/15 1509  BP: 140/90  Pulse: 86  Temp: 97.9 F (36.6 C)  Resp: 16         Assessment & Plan:    1. Viral syndrome -nasal saline -zyrtec - predniSONE (DELTASONE) 20 MG tablet; 3 tabs po day one, then 2 tabs daily x 4 days  Dispense: 11 tablet; Refill: 0 - azithromycin (ZITHROMAX Z-PAK) 250 MG tablet; 2 po day one, then 1 daily x 4 days  Dispense: 5 tablet; Refill: 0 - dicyclomine (BENTYL) 20 MG tablet; Take 1 tablet (20 mg total) by mouth 3 (three) times daily as needed for spasms.  Dispense: 60 tablet; Refill: 0

## 2015-01-30 NOTE — Patient Instructions (Signed)
Please take the prednisone until it is all the way gone.  Please wait a couple days before you start taking the zpak.  If you are no better or you start feeling worse go ahead and take it.  Please use saline in your nose as often as you can tolerate it.  Take bentyl up to 3 times daily as needed for diarrhea and stomach cramping.

## 2015-02-06 ENCOUNTER — Telehealth: Payer: Self-pay

## 2015-02-06 NOTE — Telephone Encounter (Signed)
Pt states he has had no relief of symptoms since OV. Pt went ahead and started ABX this morning. Pt is asking what should he do now? Please advise.

## 2015-02-28 ENCOUNTER — Encounter: Payer: Self-pay | Admitting: Internal Medicine

## 2015-02-28 ENCOUNTER — Ambulatory Visit (INDEPENDENT_AMBULATORY_CARE_PROVIDER_SITE_OTHER): Payer: 59 | Admitting: Physician Assistant

## 2015-02-28 VITALS — BP 138/80 | HR 86 | Temp 97.8°F | Resp 18 | Ht 69.0 in | Wt 230.0 lb

## 2015-02-28 DIAGNOSIS — B349 Viral infection, unspecified: Secondary | ICD-10-CM | POA: Diagnosis not present

## 2015-02-28 LAB — POC INFLUENZA A&B (BINAX/QUICKVUE)
INFLUENZA A, POC: NEGATIVE
INFLUENZA B, POC: NEGATIVE

## 2015-02-28 MED ORDER — FLUTICASONE PROPIONATE 50 MCG/ACT NA SUSP: 2.0000 | Freq: Every day | NASAL | Status: DC

## 2015-02-28 MED ORDER — PREDNISONE 20 MG PO TABS: ORAL_TABLET | ORAL | Status: DC

## 2015-02-28 MED ORDER — LEVOFLOXACIN 500 MG PO TABS: 500.0000 mg | ORAL_TABLET | Freq: Every day | ORAL | Status: DC

## 2015-02-28 NOTE — Patient Instructions (Signed)
Viral Infections °A viral infection can be caused by different types of viruses. Most viral infections are not serious and resolve on their own. However, some infections may cause severe symptoms and may lead to further complications. °SYMPTOMS °Viruses can frequently cause: °· Minor sore throat. °· Aches and pains. °· Headaches. °· Runny nose. °· Different types of rashes. °· Watery eyes. °· Tiredness. °· Cough. °· Loss of appetite. °· Gastrointestinal infections, resulting in nausea, vomiting, and diarrhea. °These symptoms do not respond to antibiotics because the infection is not caused by bacteria. However, you might catch a bacterial infection following the viral infection. This is sometimes called a "superinfection." Symptoms of such a bacterial infection may include: °· Worsening sore throat with pus and difficulty swallowing. °· Swollen neck glands. °· Chills and a high or persistent fever. °· Severe headache. °· Tenderness over the sinuses. °· Persistent overall ill feeling (malaise), muscle aches, and tiredness (fatigue). °· Persistent cough. °· Yellow, green, or brown mucus production with coughing. °HOME CARE INSTRUCTIONS  °· Only take over-the-counter or prescription medicines for pain, discomfort, diarrhea, or fever as directed by your caregiver. °· Drink enough water and fluids to keep your urine clear or pale yellow. Sports drinks can provide valuable electrolytes, sugars, and hydration. °· Get plenty of rest and maintain proper nutrition. Soups and broths with crackers or rice are fine. °SEEK IMMEDIATE MEDICAL CARE IF:  °· You have severe headaches, shortness of breath, chest pain, neck pain, or an unusual rash. °· You have uncontrolled vomiting, diarrhea, or you are unable to keep down fluids. °· You or your child has an oral temperature above 102° F (38.9° C), not controlled by medicine. °· Your baby is older than 3 months with a rectal temperature of 102° F (38.9° C) or higher. °· Your baby is 3  months old or younger with a rectal temperature of 100.4° F (38° C) or higher. °MAKE SURE YOU:  °· Understand these instructions. °· Will watch your condition. °· Will get help right away if you are not doing well or get worse. °  °This information is not intended to replace advice given to you by your health care provider. Make sure you discuss any questions you have with your health care provider. °  °Document Released: 01/16/2005 Document Revised: 07/01/2011 Document Reviewed: 09/14/2014 °Elsevier Interactive Patient Education ©2016 [employer] ° °

## 2015-02-28 NOTE — Progress Notes (Signed)
Subjective:    Patient ID: Javier Vega, male    DOB: Mar 27, 1961, 54 y.o.   MRN: [id number]  HPI 54 y.o. smoking WM with HTN, OSA, preDM states that he was outside yesterday started to have chills, congestion, and cough. This AM has fatigue, diarrhea, chills and nonproductive cough. No SOB, CP, AB pain, mucus/dark/blood in stool. Has had zpak, prednisone 10/10. Normal CXR 04/2014  Lab Results  Component Value Date   WBC 9.3 12/06/2014   HGB 15.3 12/06/2014   HCT 46.0 12/06/2014   MCV 93.9 12/06/2014   PLT 243 12/06/2014   Lab Results  Component Value Date   CREATININE 0.86 12/06/2014   BUN 24 12/06/2014   NA 142 12/06/2014   K 3.7 12/06/2014   CL 105 12/06/2014   CO2 25 12/06/2014    Blood pressure 138/80, pulse 86, temperature 97.8 F (36.6 C), temperature source Temporal, resp. rate 18, height '5\' 9"'$  (1.753 m), weight 230 lb (104.327 kg), SpO2 98 %.  Past Medical History  Diagnosis Date  . Hyperlipidemia   . Hypertension   . Vitamin D deficiency   . Anal fissure   . Arthritis   . Sleep apnea   . Allergy     SEASONAL   Current Outpatient Prescriptions on File Prior to Visit  Medication Sig Dispense Refill  . Ascorbic Acid (VITAMIN C PO) Take by mouth daily.    Marland Kitchen aspirin 81 MG chewable tablet Chew 81 mg by mouth daily.    Marland Kitchen atorvastatin (LIPITOR) 80 MG tablet Take 1/2 to 1 tablet daily as directed for choleterol 30 tablet 99  . Cholecalciferol (VITAMIN D PO) Take 8,000 Int'l Units by mouth daily.    . Cyanocobalamin (VITAMIN B 12 PO) Take by mouth daily.    Marland Kitchen dicyclomine (BENTYL) 20 MG tablet Take 1 tablet (20 mg total) by mouth 3 (three) times daily as needed for spasms. 60 tablet 0  . Multiple Vitamins-Minerals (MENS MULTIVITAMIN PLUS) TABS Take by mouth daily.    . Omega-3 Fatty Acids (FISH OIL PO) Take by mouth 3 (three) times daily.      No current facility-administered medications on file prior to visit.     Review of Systems  Constitutional: Positive  for chills and fatigue. Negative for fever, diaphoresis, activity change and unexpected weight change.  HENT: Positive for congestion, ear pain, postnasal drip, rhinorrhea, sinus pressure and sore throat. Negative for dental problem, drooling, ear discharge, facial swelling, hearing loss, mouth sores, nosebleeds, sneezing, tinnitus, trouble swallowing and voice change.   Respiratory: Positive for cough. Negative for apnea, choking, chest tightness, shortness of breath, wheezing and stridor.   Cardiovascular: Negative.  Negative for chest pain.  Gastrointestinal: Positive for diarrhea. Negative for nausea, vomiting, abdominal pain, constipation, blood in stool, abdominal distention, anal bleeding and rectal pain.  Musculoskeletal: Positive for myalgias. Negative for back pain, joint swelling, arthralgias, gait problem, neck pain and neck stiffness.  Neurological: Negative.  Negative for dizziness.       Objective:   Physical Exam  Constitutional: He is oriented to person, place, and time. He appears well-developed and well-nourished. No distress.  HENT:  Head: Normocephalic.  Nose: Mucosal edema present. Right sinus exhibits maxillary sinus tenderness and frontal sinus tenderness. Left sinus exhibits maxillary sinus tenderness and frontal sinus tenderness.  Mouth/Throat: Oropharynx is clear and moist. No oropharyngeal exudate.  Eyes: Conjunctivae are normal. No scleral icterus.  Neck: Normal range of motion. Neck supple. No JVD present. No thyromegaly  present.  Cardiovascular: Normal rate, regular rhythm, normal heart sounds and intact distal pulses.  Exam reveals no gallop and no friction rub.   No murmur heard. Pulmonary/Chest: Breath sounds normal. No respiratory distress. He has no wheezes. He has no rales. He exhibits no tenderness.  Abdominal: Soft. Bowel sounds are normal. He exhibits no distension and no mass. There is no tenderness. There is no rebound and no guarding.   Musculoskeletal: Normal range of motion.  Lymphadenopathy:    He has no cervical adenopathy.  Neurological: He is alert and oriented to person, place, and time.  Skin: Skin is warm and dry. He is not diaphoretic.  Psychiatric: He has a normal mood and affect. His behavior is normal. Judgment and thought content normal.  Nursing note and vitals reviewed.      Assessment & Plan:  Sinusitis/URI Negative flu Prednisone, imodium, flonase, levaquin if not feeling better, follow up 10 days if not better.

## 2015-03-13 ENCOUNTER — Ambulatory Visit (INDEPENDENT_AMBULATORY_CARE_PROVIDER_SITE_OTHER): Payer: 59 | Admitting: Internal Medicine

## 2015-03-13 ENCOUNTER — Encounter: Payer: Self-pay | Admitting: Internal Medicine

## 2015-03-13 VITALS — BP 142/84 | HR 80 | Temp 97.9°F | Resp 16 | Ht 69.0 in | Wt 230.2 lb

## 2015-03-13 DIAGNOSIS — E785 Hyperlipidemia, unspecified: Secondary | ICD-10-CM

## 2015-03-13 DIAGNOSIS — Z79899 Other long term (current) drug therapy: Secondary | ICD-10-CM | POA: Diagnosis not present

## 2015-03-13 DIAGNOSIS — J320 Chronic maxillary sinusitis: Secondary | ICD-10-CM | POA: Insufficient documentation

## 2015-03-13 DIAGNOSIS — Z6834 Body mass index (BMI) 34.0-34.9, adult: Secondary | ICD-10-CM

## 2015-03-13 DIAGNOSIS — I10 Essential (primary) hypertension: Secondary | ICD-10-CM

## 2015-03-13 DIAGNOSIS — E559 Vitamin D deficiency, unspecified: Secondary | ICD-10-CM

## 2015-03-13 DIAGNOSIS — R7303 Prediabetes: Secondary | ICD-10-CM

## 2015-03-13 MED ORDER — SULFAMETHOXAZOLE-TRIMETHOPRIM 800-160 MG PO TABS: ORAL_TABLET | ORAL | Status: AC

## 2015-03-13 NOTE — Progress Notes (Signed)
Patient ID: Javier Vega, male   DOB: 22-Jul-1960, 54 y.o.   MRN: [id number]   This very nice 54 y.o. MWM presents for  follow up with Hypertension, Hyperlipidemia, Pre-Diabetes and Vitamin D Deficiency. Patient has hx/o chronic & recurrent sinusitis and has recently been treated with a Z Pak, then Levaquin and now persists with R Maxillary tenderness.    Patient is treated for HTN since 2005 & BP has been controlled at home. Today's BP: (!) 142/84 mmHg. Patient has had no complaints of any cardiac type chest pain, palpitations, dyspnea/orthopnea/PND, dizziness, claudication, or dependent edema.   Hyperlipidemia is controlled with diet & meds. Patient denies myalgias or other med SE's. Last Lipids were at goal with Cholesterol 181; HDL 36*; LDL 92; but with elevated Triglycerides 266 on 12/06/2014.   Also, the patient has history of PreDiabetes with A1c 5.7% in 2010 and he has had no symptoms of reactive hypoglycemia, diabetic polys, paresthesias or visual blurring.  Last A1c was  5.4% on 12/06/2014.    Further, the patient also has history of Vitamin D Deficiency of 19 in 2008 and supplements vitamin D without any suspected side-effects. Last vitamin D was still extremely low at 32 on 12/06/2014.  Medication Sig  . VITAMIN C  Take by mouth daily.  Marland Kitchen aspirin 81 MG  Chew 81 mg by mouth daily.  Marland Kitchen VITAMIN D  Take 8,000 Int'l Units by mouth daily.  Marland Kitchen VITAMIN B 12 PO Take by mouth daily.  Marland Kitchen dicyclomine20 MG tablet Take 1 tablet (20 mg total) by mouth 3 (three) times daily as needed for spasms.  Marland Kitchen FLONASE nasal spray Place 2 sprays into both nostrils at bedtime.  . Multiple Vitamins-Minerals  Take by mouth daily.  . Omega-3 Fatty Acids (FISH OIL PO) Take by mouth 3 (three) times daily.   Marland Kitchen atorvastatin (LIPITOR) 80 MG tablet Take 1/2 to 1 tablet daily as directed for choleterol   Allergies  Allergen Reactions  . Crestor [Rosuvastatin]    PMHx:   Past Medical History  Diagnosis Date  .  Hyperlipidemia   . Hypertension   . Vitamin D deficiency   . Anal fissure   . Arthritis   . Sleep apnea   . Allergy     SEASONAL   Immunization History  Administered Date(s) Administered  . MMR 12/21/2005  . Pneumococcal-Unspecified 04/22/2009  . Tdap 12/21/2005   Past Surgical History  Procedure Laterality Date  . Cysto  2002   FHx:    Reviewed / unchanged  SHx:    Reviewed / unchanged  Systems Review:  Constitutional: Denies fever, chills, wt changes, headaches, insomnia, fatigue, night sweats, change in appetite. Eyes: Denies redness, blurred vision, diplopia, discharge, itchy, watery eyes.  ENT: Denies discharge, congestion, post nasal drip, epistaxis, sore throat, earache, hearing loss, dental pain, tinnitus, vertigo, sinus pain, snoring.  CV: Denies chest pain, palpitations, irregular heartbeat, syncope, dyspnea, diaphoresis, orthopnea, PND, claudication or edema. Respiratory: denies cough, dyspnea, DOE, pleurisy, hoarseness, laryngitis, wheezing.  Gastrointestinal: Denies dysphagia, odynophagia, heartburn, reflux, water brash, abdominal pain or cramps, nausea, vomiting, bloating, diarrhea, constipation, hematemesis, melena, hematochezia  or hemorrhoids. Genitourinary: Denies dysuria, frequency, urgency, nocturia, hesitancy, discharge, hematuria or flank pain. Musculoskeletal: Denies arthralgias, myalgias, stiffness, jt. swelling, pain, limping or strain/sprain.  Skin: Denies pruritus, rash, hives, warts, acne, eczema or change in skin lesion(s). Neuro: No weakness, tremor, incoordination, spasms, paresthesia or pain. Psychiatric: Denies confusion, memory loss or sensory loss. Endo: Denies change in weight, skin  or hair change.  Heme/Lymph: No excessive bleeding, bruising or enlarged lymph nodes.  Physical Exam  BP 142/84 mmHg  Pulse 80  Temp(Src) 97.9 F (36.6 C)  Resp 16  Ht 5' 9"  (1.753 m)  Wt 230 lb 3.2 oz (104.418 kg)  BMI 33.98 kg/m2  Appears well  nourished and in no distress. Eyes: PERRLA, EOMs, conjunctiva no swelling or erythema. Sinuses: No frontal/maxillary tenderness ENT/Mouth: EAC's clear, TM's nl w/o erythema, bulging. Nares clear w/o erythema, swelling, exudates. Oropharynx clear without erythema or exudates. Oral hygiene is good. Tongue normal, non obstructing. Hearing intact.  Neck: Supple. Thyroid nl. Car 2+/2+ without bruits, nodes or JVD. Chest: Respirations nl with BS clear & equal w/o rales, rhonchi, wheezing or stridor.  Cor: Heart sounds normal w/ regular rate and rhythm without sig. murmurs, gallops, clicks, or rubs. Peripheral pulses normal and equal  without edema.  Abdomen: Soft & bowel sounds normal. Non-tender w/o guarding, rebound, hernias, masses, or organomegaly.  Lymphatics: Unremarkable.  Musculoskeletal: Full ROM all peripheral extremities, joint stability, 5/5 strength, and normal gait.  Skin: Warm, dry without exposed rashes, lesions or ecchymosis apparent.  Neuro: Cranial nerves intact, reflexes equal bilaterally. Sensory-motor testing grossly intact. Tendon reflexes grossly intact.  Pysch: Alert & oriented x 3.  Insight and judgement nl & appropriate. No ideations.  Assessment and Plan:  1. Essential hypertension  - TSH  2. Hyperlipidemia  - Lipid panel  3. Prediabetes  - Hemoglobin A1c - Insulin, random  4. Vitamin D deficiency  - VITAMIN D 25 Hydroxy   5. Right maxillary sinusitis, chronic  - sulfamethoxazole-trimethoprim (BACTRIM DS,SEPTRA DS) 800-160 MG tablet; Take 1 tablet 2 x day with food for infection  Dispense: 60 tablet; Refill: 0 - Patient is referred for ENT evaluation  6. BMI 34.0-34.9,adult   7. Medication management  - CBC with Differential/Platelet - BASIC METABOLIC PANEL WITH GFR - Hepatic function panel   Recommended regular exercise, BP monitoring, weight control, and discussed med and SE's. Recommended labs to assess and monitor clinical status. Further  disposition pending results of labs. Over 30 minutes of exam, counseling, chart review was performed

## 2015-03-13 NOTE — Patient Instructions (Signed)

## 2015-03-14 LAB — HEPATIC FUNCTION PANEL
ALBUMIN: 4 g/dL (ref 3.6–5.1)
ALK PHOS: 67 U/L (ref 40–115)
ALT: 26 U/L (ref 9–46)
AST: 20 U/L (ref 10–35)
BILIRUBIN TOTAL: 0.4 mg/dL (ref 0.2–1.2)
Bilirubin, Direct: 0.1 mg/dL (ref <=0.2)
Indirect Bilirubin: 0.3 mg/dL (ref 0.2–1.2)
Total Protein: 6.5 g/dL (ref 6.1–8.1)

## 2015-03-14 LAB — CBC WITH DIFFERENTIAL/PLATELET
BASOS ABS: 0.1 10*3/uL (ref 0.0–0.1)
BASOS PCT: 1 % (ref 0–1)
EOS ABS: 0.2 10*3/uL (ref 0.0–0.7)
EOS PCT: 2 % (ref 0–5)
HCT: 49.2 % (ref 39.0–52.0)
Hemoglobin: 16.7 g/dL (ref 13.0–17.0)
LYMPHS ABS: 3.4 10*3/uL (ref 0.7–4.0)
Lymphocytes Relative: 30 % (ref 12–46)
MCH: 31.9 pg (ref 26.0–34.0)
MCHC: 33.9 g/dL (ref 30.0–36.0)
MCV: 94.1 fL (ref 78.0–100.0)
MPV: 10 fL (ref 8.6–12.4)
Monocytes Absolute: 0.9 10*3/uL (ref 0.1–1.0)
Monocytes Relative: 8 % (ref 3–12)
Neutro Abs: 6.6 10*3/uL (ref 1.7–7.7)
Neutrophils Relative %: 59 % (ref 43–77)
PLATELETS: 233 10*3/uL (ref 150–400)
RBC: 5.23 MIL/uL (ref 4.22–5.81)
RDW: 13.2 % (ref 11.5–15.5)
WBC: 11.2 10*3/uL — AB (ref 4.0–10.5)

## 2015-03-14 LAB — TSH: TSH: 1.426 u[IU]/mL (ref 0.350–4.500)

## 2015-03-14 LAB — LIPID PANEL
CHOLESTEROL: 196 mg/dL (ref 125–200)
HDL: 29 mg/dL — ABNORMAL LOW (ref >=40)
TRIGLYCERIDES: 444 mg/dL — AB (ref <150)
Total CHOL/HDL Ratio: 6.8 Ratio — ABNORMAL HIGH (ref <=5.0)

## 2015-03-14 LAB — BASIC METABOLIC PANEL WITH GFR
BUN: 16 mg/dL (ref 7–25)
CO2: 27 mmol/L (ref 20–31)
Calcium: 9.2 mg/dL (ref 8.6–10.3)
Chloride: 103 mmol/L (ref 98–110)
Creat: 0.81 mg/dL (ref 0.70–1.33)
GFR, Est Non African American: >89 mL/min (ref >=60)
Glucose, Bld: 87 mg/dL (ref 65–99)
POTASSIUM: 4 mmol/L (ref 3.5–5.3)
SODIUM: 139 mmol/L (ref 135–146)

## 2015-03-14 LAB — INSULIN, RANDOM: INSULIN: 12.4 u[IU]/mL (ref 2.0–19.6)

## 2015-03-14 LAB — HEMOGLOBIN A1C
Hgb A1c MFr Bld: 5.8 % — ABNORMAL HIGH (ref <5.7)
MEAN PLASMA GLUCOSE: 120 mg/dL — AB (ref <117)

## 2015-03-14 LAB — VITAMIN D 25 HYDROXY (VIT D DEFICIENCY, FRACTURES): Vit D, 25-Hydroxy: 42 ng/mL (ref 30–100)

## 2015-03-15 ENCOUNTER — Ambulatory Visit: Payer: Self-pay | Admitting: Internal Medicine

## 2015-04-30 DIAGNOSIS — G4733 Obstructive sleep apnea (adult) (pediatric): Secondary | ICD-10-CM | POA: Diagnosis not present

## 2015-05-01 DIAGNOSIS — G4733 Obstructive sleep apnea (adult) (pediatric): Secondary | ICD-10-CM | POA: Diagnosis not present

## 2015-05-05 DIAGNOSIS — J018 Other acute sinusitis: Secondary | ICD-10-CM | POA: Diagnosis not present

## 2015-05-05 DIAGNOSIS — F17200 Nicotine dependence, unspecified, uncomplicated: Secondary | ICD-10-CM | POA: Diagnosis not present

## 2015-05-05 DIAGNOSIS — R0602 Shortness of breath: Secondary | ICD-10-CM | POA: Diagnosis not present

## 2015-05-05 DIAGNOSIS — Z720 Tobacco use: Secondary | ICD-10-CM | POA: Diagnosis not present

## 2015-05-22 ENCOUNTER — Telehealth: Payer: Self-pay | Admitting: Internal Medicine

## 2015-05-22 DIAGNOSIS — J019 Acute sinusitis, unspecified: Secondary | ICD-10-CM | POA: Diagnosis not present

## 2015-05-22 DIAGNOSIS — R0602 Shortness of breath: Secondary | ICD-10-CM | POA: Diagnosis not present

## 2015-05-22 DIAGNOSIS — J22 Unspecified acute lower respiratory infection: Secondary | ICD-10-CM | POA: Diagnosis not present

## 2015-05-22 DIAGNOSIS — B9689 Other specified bacterial agents as the cause of diseases classified elsewhere: Secondary | ICD-10-CM | POA: Diagnosis not present

## 2015-05-22 NOTE — Telephone Encounter (Signed)
patient called to advised returning CPAP machine to [facility], not willing to pay.

## 2015-05-31 DIAGNOSIS — G4733 Obstructive sleep apnea (adult) (pediatric): Secondary | ICD-10-CM | POA: Diagnosis not present

## 2015-06-06 ENCOUNTER — Encounter: Payer: Self-pay | Admitting: Internal Medicine

## 2015-06-15 ENCOUNTER — Ambulatory Visit (INDEPENDENT_AMBULATORY_CARE_PROVIDER_SITE_OTHER): Payer: 59 | Admitting: Internal Medicine

## 2015-06-15 ENCOUNTER — Encounter: Payer: Self-pay | Admitting: Internal Medicine

## 2015-06-15 VITALS — BP 122/80 | HR 76 | Temp 97.5°F | Resp 16 | Ht 69.0 in | Wt 230.4 lb

## 2015-06-15 DIAGNOSIS — I10 Essential (primary) hypertension: Secondary | ICD-10-CM | POA: Diagnosis not present

## 2015-06-15 DIAGNOSIS — R6889 Other general symptoms and signs: Secondary | ICD-10-CM | POA: Diagnosis not present

## 2015-06-15 DIAGNOSIS — E559 Vitamin D deficiency, unspecified: Secondary | ICD-10-CM | POA: Diagnosis not present

## 2015-06-15 DIAGNOSIS — E785 Hyperlipidemia, unspecified: Secondary | ICD-10-CM | POA: Diagnosis not present

## 2015-06-15 DIAGNOSIS — Z125 Encounter for screening for malignant neoplasm of prostate: Secondary | ICD-10-CM

## 2015-06-15 DIAGNOSIS — Z0001 Encounter for general adult medical examination with abnormal findings: Secondary | ICD-10-CM

## 2015-06-15 DIAGNOSIS — R7303 Prediabetes: Secondary | ICD-10-CM | POA: Diagnosis not present

## 2015-06-15 DIAGNOSIS — Z0000 Encounter for general adult medical examination without abnormal findings: Secondary | ICD-10-CM | POA: Diagnosis not present

## 2015-06-15 DIAGNOSIS — R7309 Other abnormal glucose: Secondary | ICD-10-CM | POA: Diagnosis not present

## 2015-06-15 DIAGNOSIS — Z1212 Encounter for screening for malignant neoplasm of rectum: Secondary | ICD-10-CM

## 2015-06-15 DIAGNOSIS — Z79899 Other long term (current) drug therapy: Secondary | ICD-10-CM

## 2015-06-15 DIAGNOSIS — Z9989 Dependence on other enabling machines and devices: Secondary | ICD-10-CM

## 2015-06-15 DIAGNOSIS — E669 Obesity, unspecified: Secondary | ICD-10-CM

## 2015-06-15 DIAGNOSIS — R5383 Other fatigue: Secondary | ICD-10-CM

## 2015-06-15 DIAGNOSIS — G4733 Obstructive sleep apnea (adult) (pediatric): Secondary | ICD-10-CM

## 2015-06-15 LAB — CBC WITH DIFFERENTIAL/PLATELET
BASOS ABS: 0 10*3/uL (ref 0.0–0.1)
Basophils Relative: 0 % (ref 0–1)
EOS PCT: 3 % (ref 0–5)
Eosinophils Absolute: 0.3 10*3/uL (ref 0.0–0.7)
HEMATOCRIT: 47.9 % (ref 39.0–52.0)
HEMOGLOBIN: 16.7 g/dL (ref 13.0–17.0)
LYMPHS ABS: 2.8 10*3/uL (ref 0.7–4.0)
LYMPHS PCT: 29 % (ref 12–46)
MCH: 32.2 pg (ref 26.0–34.0)
MCHC: 34.9 g/dL (ref 30.0–36.0)
MCV: 92.3 fL (ref 78.0–100.0)
MONO ABS: 0.8 10*3/uL (ref 0.1–1.0)
MPV: 10.1 fL (ref 8.6–12.4)
Monocytes Relative: 8 % (ref 3–12)
NEUTROS ABS: 5.8 10*3/uL (ref 1.7–7.7)
Neutrophils Relative %: 60 % (ref 43–77)
Platelets: 232 10*3/uL (ref 150–400)
RBC: 5.19 MIL/uL (ref 4.22–5.81)
RDW: 13.2 % (ref 11.5–15.5)
WBC: 9.6 10*3/uL (ref 4.0–10.5)

## 2015-06-15 NOTE — Progress Notes (Addendum)
Patient ID: Javier Vega, male   DOB: [birth year], 55 y.o.   MRN: [id number]  Annual  Screening/Preventative Visit And Comprehensive Evaluation & Examination  This very nice 55 y.o. MWM presents for a Wellness/Preventative Visit & comprehensive evaluation and management of multiple medical co-morbidities.  Patient has been followed for HTN, Prediabetes, Hyperlipidemia and Vitamin D Deficiency. Patient also has hx/o OSA and relates that he has been intolerant of the CPAP mask and the pressure and is interested in pursuing other options as with the [facility] intra-oral device.    Labile HTN predates circa 2008. Patient's BP has been controlled at home.Today's BP: 122/80 mmHg. Patient denies any cardiac symptoms as chest pain, palpitations, shortness of breath, dizziness or ankle swelling.   Patient's hyperlipidemia is not controlled with diet and medications. Patient denies myalgias or other medication SE's. Last lipids were Cholesterol 196; HDL 29*; LDL NOT CALC; and elevated Triglycerides 444 on 03/13/2015.    Patient has prediabetes since 5.7% in 2010  and patient denies reactive hypoglycemic symptoms, visual blurring, diabetic polys or paresthesias. Last A1c was  5.8% on 03/13/2015.   Finally, patient has history of Vitamin D Deficiency of "17" in 2008 and last vitamin D was still low at 42 on 03/13/2015.  Medication Sig  . VITAMIN C  Take by mouth daily.  Marland Kitchen aspirin 81 MG  Chew 81 mg by mouth daily.  Marland Kitchen VITAMIN D  Take 8,000 Int'l Units by mouth daily.  Marland Kitchen VITAMIN B 12 PO Take by mouth daily.  Marland Kitchen FLONASE nasal spray Place 2 sprays into both nostrils at bedtime.  Marland Kitchen MENS MULTIVITAMIN PLUs Take by mouth daily.  . Omega-3 FISH OIL Take by mouth 3 (three) times daily.   Marland Kitchen atorvastatin 80 MG  Take 1/2 to 1 tablet daily as directed for choleterol   Allergies  Allergen Reactions  . Crestor [Rosuvastatin]    Past Medical History  Diagnosis Date  . Hyperlipidemia   . Hypertension   . Vitamin D  deficiency   . Anal fissure   . Arthritis   . Sleep apnea   . Allergy     SEASONAL   Health Maintenance  Topic Date Due  . INFLUENZA VACCINE  11/21/2014  . TETANUS/TDAP  12/22/2015  . COLONOSCOPY  11/16/2017  . Hepatitis C Screening  Completed  . HIV Screening  Completed   Immunization History  Administered Date(s) Administered  . MMR 12/21/2005  . Pneumococcal-Unspecified 04/22/2009  . Tdap 12/21/2005   Past Surgical History  Procedure Laterality Date  . Cysto  2002   Family History  Problem Relation Age of Onset  . Adopted: Yes  . Colon cancer Neg Hx     Pt was adopted, does not know Fx  . Colon polyps Neg Hx    Social History   Social History  . Marital Status: Married    Spouse Name: N/A  . Number of Children: 2  . Years of Education: N/A   Occupational History  . Maintenance [employer]   Social History Main Topics  . Smoking status: Current Every Day Smoker -- down to 4-5 cig/days    Types: E-cigarettes  . Smokeless tobacco: Never Used     Comment: smokes e cigs now  . Alcohol Use: 0.0 oz/week    0 Standard drinks or equivalent per week     Comment: Occassionally  . Drug Use: No  . Sexual Activity: Active    ROS Constitutional: Denies fever, chills, weight loss/gain, headaches, insomnia,  night sweats or change in appetite. Does c/o fatigue. Eyes: Denies redness, blurred vision, diplopia, discharge, itchy or watery eyes.  ENT: Denies discharge, congestion, post nasal drip, epistaxis, sore throat, earache, hearing loss, dental pain, Tinnitus, Vertigo, Sinus pain or snoring.  Cardio: Denies chest pain, palpitations, irregular heartbeat, syncope, dyspnea, diaphoresis, orthopnea, PND, claudication or edema Respiratory: denies cough, dyspnea, DOE, pleurisy, hoarseness, laryngitis or wheezing.  Gastrointestinal: Denies dysphagia, heartburn, reflux, water brash, pain, cramps, nausea, vomiting, bloating, diarrhea, constipation, hematemesis, melena,  hematochezia, jaundice or hemorrhoids Genitourinary: Denies dysuria, frequency, urgency, nocturia, hesitancy, discharge, hematuria or flank pain Musculoskeletal: Denies arthralgia, myalgia, stiffness, Jt. Swelling, pain, limp or strain/sprain. Denies Falls. Skin: Denies puritis, rash, hives, warts, acne, eczema or change in skin lesion Neuro: No weakness, tremor, incoordination, spasms, paresthesia or pain Psychiatric: Denies confusion, memory loss or sensory loss. Denies Depression. Endocrine: Denies change in weight, skin, hair change, nocturia, and paresthesia, diabetic polys, visual blurring or hyper / hypo glycemic episodes.  Heme/Lymph: No excessive bleeding, bruising or enlarged lymph nodes.  Physical Exam  BP 122/80 mmHg  Pulse 76  Temp(Src) 97.5 F (36.4 C)  Resp 16  Ht 5' 9"  (1.753 m)  Wt 230 lb 6.4 oz (104.509 kg)  BMI 34.01 kg/m2  General Appearance: Well nourished, in no apparent distress. Eyes: PERRLA, EOMs, conjunctiva no swelling or erythema, normal fundi and vessels. Sinuses: No frontal/maxillary tenderness ENT/Mouth: EACs patent / TMs  nl. Nares clear without erythema, swelling, mucoid exudates. Oral hygiene is good. No erythema, swelling, or exudate. Tongue normal, non-obstructing. Tonsils not swollen or erythematous. Hearing normal.  Neck: Supple, thyroid normal. No bruits, nodes or JVD. Respiratory: Respiratory effort normal.  BS equal and clear bilateral without rales, rhonci, wheezing or stridor. Cardio: Heart sounds are normal with regular rate and rhythm and no murmurs, rubs or gallops. Peripheral pulses are normal and equal bilaterally without edema. No aortic or femoral bruits. Chest: symmetric with normal excursions and percussion.  Abdomen: Soft, with Nl bowel sounds. Nontender, no guarding, rebound, hernias, masses, or organomegaly.  Lymphatics: Non tender without lymphadenopathy.  Genitourinary: No hernias.Testes nl. DRE - prostate nl for age - smooth &  firm w/o nodules. Musculoskeletal: Full ROM all peripheral extremities, joint stability, 5/5 strength, and normal gait. Skin: Warm and dry without rashes, lesions, cyanosis, clubbing or  ecchymosis.  Neuro: Cranial nerves intact, reflexes equal bilaterally. Normal muscle tone, no cerebellar symptoms. Sensation intact.  Pysch: Alert and oriented X 3 with normal affect, insight and judgment appropriate.   Assessment and Plan  1. Annual Preventative/Screening Exam   - Microalbumin / creatinine urine ratio - EKG 12-Lead - [place], RETROPERITNL ABD,  LTD - POC Hemoccult Bld/Stl  - Urinalysis, Routine w reflex microscopic  - Vitamin B12 - Iron and TIBC - PSA - Testosterone - CBC with Differential/Platelet - BASIC METABOLIC PANEL WITH GFR - Hepatic function panel - Magnesium - Lipid panel - TSH - Hemoglobin A1c - Insulin, random - VITAMIN D 25 Hydroxy   2. Essential hypertension  - Microalbumin / creatinine urine ratio - EKG 12-Lead - [place], RETROPERITNL ABD,  LTD - TSH  3. Hyperlipidemia  - Lipid panel - TSH  4. Prediabetes  - Hemoglobin A1c - Insulin, random  5. Vitamin D deficiency  - VITAMIN D 25 Hydroxy   6. OSA on CPAP   7. Obesity   8. Screening for rectal cancer  - POC Hemoccult Bld/Stl   9. Prostate cancer screening  - PSA  10. Other fatigue  -  Vitamin B12 - Iron and TIBC - Testosterone - CBC with Differential/Platelet - TSH  11. Medication management  - Urinalysis, Routine w reflex microscopic  - CBC with Differential/Platelet - BASIC METABOLIC PANEL WITH GFR - Hepatic function panel - Magnesium   Continue prudent diet as discussed, weight control, BP monitoring, regular exercise, and medications as discussed.  Discussed med effects and SE's. Routine screening labs and tests as requested with regular follow-up as recommended. Over 40 minutes of exam, counseling, chart review and high complex critical decision making was performed

## 2015-06-15 NOTE — Patient Instructions (Signed)

## 2015-06-15 NOTE — Addendum Note (Signed)
Addended by: Unk Pinto on: 06/15/2015 09:21 PM   Modules accepted: Orders

## 2015-06-16 LAB — HEPATIC FUNCTION PANEL
ALBUMIN: 4.4 g/dL (ref 3.6–5.1)
ALK PHOS: 70 U/L (ref 40–115)
ALT: 26 U/L (ref 9–46)
AST: 22 U/L (ref 10–35)
BILIRUBIN INDIRECT: 0.3 mg/dL (ref 0.2–1.2)
Bilirubin, Direct: 0.1 mg/dL (ref <=0.2)
TOTAL PROTEIN: 7.3 g/dL (ref 6.1–8.1)
Total Bilirubin: 0.4 mg/dL (ref 0.2–1.2)

## 2015-06-16 LAB — IRON AND TIBC
%SAT: 33 % (ref 15–60)
IRON: 111 ug/dL (ref 50–180)
TIBC: 335 ug/dL (ref 250–425)
UIBC: 224 ug/dL (ref 125–400)

## 2015-06-16 LAB — TESTOSTERONE: Testosterone: 272 ng/dL (ref 250–827)

## 2015-06-16 LAB — LIPID PANEL
Cholesterol: 220 mg/dL — ABNORMAL HIGH (ref 125–200)
HDL: 39 mg/dL — ABNORMAL LOW (ref >=40)
LDL CALC: 141 mg/dL — AB (ref <130)
TRIGLYCERIDES: 199 mg/dL — AB (ref <150)
Total CHOL/HDL Ratio: 5.6 Ratio — ABNORMAL HIGH (ref <=5.0)
VLDL: 40 mg/dL — AB (ref <30)

## 2015-06-16 LAB — URINALYSIS, ROUTINE W REFLEX MICROSCOPIC
BILIRUBIN URINE: NEGATIVE
Glucose, UA: NEGATIVE
Hgb urine dipstick: NEGATIVE
Ketones, ur: NEGATIVE
LEUKOCYTES UA: NEGATIVE
NITRITE: NEGATIVE
PROTEIN: NEGATIVE
SPECIFIC GRAVITY, URINE: 1.023 (ref 1.001–1.035)
pH: 6 (ref 5.0–8.0)

## 2015-06-16 LAB — VITAMIN D 25 HYDROXY (VIT D DEFICIENCY, FRACTURES): Vit D, 25-Hydroxy: 44 ng/mL (ref 30–100)

## 2015-06-16 LAB — MICROALBUMIN / CREATININE URINE RATIO
Creatinine, Urine: 148 mg/dL (ref 20–370)
MICROALB UR: 0.6 mg/dL
Microalb Creat Ratio: 4 mcg/mg creat (ref <30)

## 2015-06-16 LAB — PSA: PSA: 0.75 ng/mL (ref <=4.00)

## 2015-06-16 LAB — BASIC METABOLIC PANEL WITH GFR
BUN: 21 mg/dL (ref 7–25)
CHLORIDE: 104 mmol/L (ref 98–110)
CO2: 21 mmol/L (ref 20–31)
Calcium: 9.4 mg/dL (ref 8.6–10.3)
Creat: 0.75 mg/dL (ref 0.70–1.33)
GLUCOSE: 89 mg/dL (ref 65–99)
POTASSIUM: 4 mmol/L (ref 3.5–5.3)
Sodium: 137 mmol/L (ref 135–146)

## 2015-06-16 LAB — HEMOGLOBIN A1C
Hgb A1c MFr Bld: 5.6 % (ref <5.7)
MEAN PLASMA GLUCOSE: 114 mg/dL (ref <117)

## 2015-06-16 LAB — MAGNESIUM: Magnesium: 2.1 mg/dL (ref 1.5–2.5)

## 2015-06-16 LAB — INSULIN, RANDOM: INSULIN: 11.7 u[IU]/mL (ref 2.0–19.6)

## 2015-06-16 LAB — VITAMIN B12: VITAMIN B 12: 402 pg/mL (ref 200–1100)

## 2015-06-16 LAB — TSH: TSH: 1.57 m[IU]/L (ref 0.40–4.50)

## 2015-06-20 ENCOUNTER — Other Ambulatory Visit: Payer: Self-pay | Admitting: Internal Medicine

## 2015-06-30 DIAGNOSIS — G4733 Obstructive sleep apnea (adult) (pediatric): Secondary | ICD-10-CM | POA: Diagnosis not present

## 2015-07-02 DIAGNOSIS — J018 Other acute sinusitis: Secondary | ICD-10-CM | POA: Diagnosis not present

## 2015-07-31 DIAGNOSIS — G4733 Obstructive sleep apnea (adult) (pediatric): Secondary | ICD-10-CM | POA: Diagnosis not present

## 2015-08-15 DIAGNOSIS — J018 Other acute sinusitis: Secondary | ICD-10-CM | POA: Diagnosis not present

## 2015-09-07 DIAGNOSIS — B9689 Other specified bacterial agents as the cause of diseases classified elsewhere: Secondary | ICD-10-CM | POA: Diagnosis not present

## 2015-09-07 DIAGNOSIS — F17200 Nicotine dependence, unspecified, uncomplicated: Secondary | ICD-10-CM | POA: Diagnosis not present

## 2015-09-07 DIAGNOSIS — Z87891 Personal history of nicotine dependence: Secondary | ICD-10-CM | POA: Diagnosis not present

## 2015-09-07 DIAGNOSIS — J019 Acute sinusitis, unspecified: Secondary | ICD-10-CM | POA: Diagnosis not present

## 2015-09-07 DIAGNOSIS — J029 Acute pharyngitis, unspecified: Secondary | ICD-10-CM | POA: Diagnosis not present

## 2015-09-14 ENCOUNTER — Encounter: Payer: Self-pay | Admitting: Internal Medicine

## 2015-09-14 ENCOUNTER — Ambulatory Visit (INDEPENDENT_AMBULATORY_CARE_PROVIDER_SITE_OTHER): Payer: 59 | Admitting: Internal Medicine

## 2015-09-14 VITALS — BP 126/76 | HR 84 | Temp 98.0°F | Resp 18 | Ht 69.0 in | Wt 230.0 lb

## 2015-09-14 DIAGNOSIS — Z79899 Other long term (current) drug therapy: Secondary | ICD-10-CM

## 2015-09-14 DIAGNOSIS — E559 Vitamin D deficiency, unspecified: Secondary | ICD-10-CM | POA: Diagnosis not present

## 2015-09-14 DIAGNOSIS — I10 Essential (primary) hypertension: Secondary | ICD-10-CM | POA: Diagnosis not present

## 2015-09-14 DIAGNOSIS — R7303 Prediabetes: Secondary | ICD-10-CM

## 2015-09-14 DIAGNOSIS — E785 Hyperlipidemia, unspecified: Secondary | ICD-10-CM

## 2015-09-14 LAB — CBC WITH DIFFERENTIAL/PLATELET
BASOS ABS: 0 {cells}/uL (ref 0–200)
Basophils Relative: 0 %
EOS ABS: 250 {cells}/uL (ref 15–500)
Eosinophils Relative: 2 %
HEMATOCRIT: 48.7 % (ref 38.5–50.0)
Hemoglobin: 16.9 g/dL (ref 13.2–17.1)
LYMPHS PCT: 27 %
Lymphs Abs: 3375 cells/uL (ref 850–3900)
MCH: 31.9 pg (ref 27.0–33.0)
MCHC: 34.7 g/dL (ref 32.0–36.0)
MCV: 92.1 fL (ref 80.0–100.0)
MONO ABS: 875 {cells}/uL (ref 200–950)
MONOS PCT: 7 %
MPV: 10.2 fL (ref 7.5–12.5)
NEUTROS PCT: 64 %
Neutro Abs: 8000 cells/uL — ABNORMAL HIGH (ref 1500–7800)
Platelets: 237 10*3/uL (ref 140–400)
RBC: 5.29 MIL/uL (ref 4.20–5.80)
RDW: 13.5 % (ref 11.0–15.0)
WBC: 12.5 10*3/uL — ABNORMAL HIGH (ref 3.8–10.8)

## 2015-09-14 LAB — BASIC METABOLIC PANEL WITH GFR
BUN: 21 mg/dL (ref 7–25)
CO2: 23 mmol/L (ref 20–31)
CREATININE: 0.99 mg/dL (ref 0.70–1.33)
Calcium: 9.5 mg/dL (ref 8.6–10.3)
Chloride: 102 mmol/L (ref 98–110)
GFR, Est Non African American: 86 mL/min (ref >=60)
GLUCOSE: 89 mg/dL (ref 65–99)
POTASSIUM: 4 mmol/L (ref 3.5–5.3)
Sodium: 138 mmol/L (ref 135–146)

## 2015-09-14 LAB — LIPID PANEL
CHOLESTEROL: 217 mg/dL — AB (ref 125–200)
HDL: 47 mg/dL (ref >=40)
LDL Cholesterol: 136 mg/dL — ABNORMAL HIGH (ref <130)
Total CHOL/HDL Ratio: 4.6 Ratio (ref <=5.0)
Triglycerides: 172 mg/dL — ABNORMAL HIGH (ref <150)
VLDL: 34 mg/dL — ABNORMAL HIGH (ref <30)

## 2015-09-14 LAB — HEPATIC FUNCTION PANEL
ALBUMIN: 4.3 g/dL (ref 3.6–5.1)
ALK PHOS: 72 U/L (ref 40–115)
ALT: 36 U/L (ref 9–46)
AST: 20 U/L (ref 10–35)
Bilirubin, Direct: 0.1 mg/dL (ref <=0.2)
Indirect Bilirubin: 0.3 mg/dL (ref 0.2–1.2)
TOTAL PROTEIN: 7 g/dL (ref 6.1–8.1)
Total Bilirubin: 0.4 mg/dL (ref 0.2–1.2)

## 2015-09-14 NOTE — Progress Notes (Signed)
Assessment and Plan:  Hypertension:  -Continue medication,  -monitor blood pressure at home.  -Continue DASH diet.   -Reminder to go to the ER if any CP, SOB, nausea, dizziness, severe HA, changes vision/speech, left arm numbness and tingling, and jaw pain.  Cholesterol: -Continue diet and exercise.  -Check cholesterol.   Pre-diabetes: -Continue diet and exercise.   Vitamin D Def: -continue medications.   Smoking -not interested in smoking  Continue diet and meds as discussed. Further disposition pending results of labs.  HPI 55 y.o. male  presents for 3 month follow up with hypertension, hyperlipidemia, prediabetes and vitamin D.   His blood pressure has been controlled at home, today their BP is BP: 126/76 mmHg.   He does workout. He denies chest pain, shortness of breath, dizziness.   He is on cholesterol medication and denies myalgias. His cholesterol is at goal. The cholesterol last visit was:   Lab Results  Component Value Date   CHOL 220* 06/15/2015   HDL 39* 06/15/2015   LDLCALC 141* 06/15/2015   TRIG 199* 06/15/2015   CHOLHDL 5.6* 06/15/2015     He has been working on diet and exercise for prediabetes, and denies foot ulcerations, hyperglycemia, hypoglycemia , increased appetite, nausea, paresthesia of the feet, polydipsia, polyuria, visual disturbances, vomiting and weight loss. Last A1C in the office was:  Lab Results  Component Value Date   HGBA1C 5.6 06/15/2015    Patient is on Vitamin D supplement.  Lab Results  Component Value Date   VD25OH 44 06/15/2015      Current Medications:  Current Outpatient Prescriptions on File Prior to Visit  Medication Sig Dispense Refill  . Ascorbic Acid (VITAMIN C PO) Take by mouth daily.    Marland Kitchen aspirin 81 MG chewable tablet Chew 81 mg by mouth daily.    Marland Kitchen atorvastatin (LIPITOR) 80 MG tablet TAKE 1/2 TO 1 TABLET BY MOUTH EACH DAY AS DIRECTED FOR CHOLESTEROL 90 tablet 1  . Cholecalciferol (VITAMIN D PO) Take 8,000 Int'l  Units by mouth daily.    . Cyanocobalamin (VITAMIN B 12 PO) Take by mouth daily.    . fluticasone (FLONASE) 50 MCG/ACT nasal spray Place 2 sprays into both nostrils at bedtime. 16 g 1  . Multiple Vitamins-Minerals (MENS MULTIVITAMIN PLUS) TABS Take by mouth daily.    . Omega-3 Fatty Acids (FISH OIL PO) Take by mouth 3 (three) times daily.      No current facility-administered medications on file prior to visit.    Medical History:  Past Medical History  Diagnosis Date  . Hyperlipidemia   . Hypertension   . Vitamin D deficiency   . Anal fissure   . Arthritis   . Sleep apnea   . Allergy     SEASONAL    Allergies:  Allergies  Allergen Reactions  . Crestor [Rosuvastatin]      Review of Systems:  Review of Systems  Constitutional: Negative for fever, chills and malaise/fatigue.  HENT: Negative for congestion, ear pain and sore throat.   Eyes: Negative.   Respiratory: Negative for cough, shortness of breath and wheezing.   Cardiovascular: Negative for chest pain, palpitations and leg swelling.  Gastrointestinal: Negative for heartburn, abdominal pain, diarrhea, constipation, blood in stool and melena.  Genitourinary: Negative.   Skin: Negative.   Neurological: Negative for dizziness, sensory change, loss of consciousness and headaches.    Family history- Review and unchanged  Social history- Review and unchanged  Physical Exam: BP 126/76 mmHg  Pulse  84  Temp(Src) 98 F (36.7 C) (Temporal)  Resp 18  Ht '5\' 9"'$  (1.753 m)  Wt 230 lb (104.327 kg)  BMI 33.95 kg/m2 Wt Readings from Last 3 Encounters:  09/14/15 230 lb (104.327 kg)  06/15/15 230 lb 6.4 oz (104.509 kg)  03/13/15 230 lb 3.2 oz (104.418 kg)    General Appearance: Well nourished well developed, in no apparent distress. Eyes: PERRLA, EOMs, conjunctiva no swelling or erythema ENT/Mouth: Ear canals normal without obstruction, swelling, erythma, discharge.  TMs normal bilaterally.  Oropharynx moist, clear,  without exudate, or postoropharyngeal swelling. Neck: Supple, thyroid normal,no cervical adenopathy  Respiratory: Respiratory effort normal, Breath sounds clear A&P without rhonchi, wheeze, or rale.  No retractions, no accessory usage. Cardio: RRR with no MRGs. Brisk peripheral pulses without edema.  Abdomen: Soft, + BS,  Non tender, no guarding, rebound, hernias, masses. Musculoskeletal: Full ROM, 5/5 strength, Normal gait Skin: Warm, dry without rashes, lesions, ecchymosis.  Neuro: Awake and oriented X 3, Cranial nerves intact. Normal muscle tone, no cerebellar symptoms. Psych: Normal affect, Insight and Judgment appropriate.    Starlyn Skeans, PA-C 3:45 PM [facility]

## 2015-10-06 ENCOUNTER — Encounter: Payer: Self-pay | Admitting: Internal Medicine

## 2015-10-06 ENCOUNTER — Ambulatory Visit (INDEPENDENT_AMBULATORY_CARE_PROVIDER_SITE_OTHER): Payer: 59 | Admitting: Internal Medicine

## 2015-10-06 VITALS — BP 114/62 | HR 86 | Temp 98.2°F | Resp 18 | Ht 69.0 in | Wt 238.0 lb

## 2015-10-06 DIAGNOSIS — B349 Viral infection, unspecified: Secondary | ICD-10-CM | POA: Diagnosis not present

## 2015-10-06 MED ORDER — PREDNISONE 20 MG PO TABS: ORAL_TABLET | ORAL | Status: DC

## 2015-10-06 MED ORDER — FLUTICASONE PROPIONATE 50 MCG/ACT NA SUSP: 2.0000 | Freq: Every day | NASAL | Status: DC

## 2015-10-06 MED ORDER — PSEUDOEPH-BROMPHEN-DM 30-2-10 MG/5ML PO SYRP: 5.0000 mL | ORAL_SOLUTION | Freq: Four times a day (QID) | ORAL | Status: DC | PRN

## 2015-10-06 NOTE — Progress Notes (Signed)
HPI  Patient presents to the office for evaluation of right ear pain and also a sore throat.  He notes that it is accompanied by some ringing in both ears and is also notes that the throat is sore on the right side.  This has been going on since Wednesday.  He does have some minor dizziness. They also endorse change in voice, postnasal drip and night time congestion mild coughing at nighttime when laying down, no trouble with swallowing, not issues with opening his jaw.  .  They have tried xyzal.  They report that nothing has worked.  They denies other sick contacts.  He does use saline spray.  Review of Systems  Constitutional: Negative for fever, chills and malaise/fatigue.  HENT: Positive for congestion and ear pain. Negative for sore throat.   Respiratory: Positive for cough. Negative for shortness of breath and wheezing.   Cardiovascular: Negative for chest pain, palpitations and leg swelling.  Neurological: Negative for headaches.    PE:  Filed Vitals:   10/06/15 1030  BP: 114/62  Pulse: 86  Temp: 98.2 F (36.8 C)  Resp: 18   General:  Alert and non-toxic, WDWN, NAD HEENT: NCAT, PERLA, EOM normal, no occular discharge or erythema.  Nasal mucosal edema with sinus tenderness to palpation.  Oropharynx clear with minimal oropharyngeal edema and erythema.  Mucous membranes moist and pink. Neck:  Cervical adenopathy Chest:  RRR no MRGs.  Lungs clear to auscultation A&P with no wheezes rhonchi or rales.   Abdomen: +BS x 4 quadrants, soft, non-tender, no guarding, rigidity, or rebound. Skin: warm and dry no rash Neuro: A&Ox4, CN II-XII grossly intact  Assessment and Plan:   Acute URI -prednisone -bromifed dm - fluticasone (FLONASE) 50 MCG/ACT nasal spray; Place 2 sprays into both nostrils at bedtime.  Dispense: 16 g; Refill: 1

## 2015-11-01 DIAGNOSIS — H5203 Hypermetropia, bilateral: Secondary | ICD-10-CM | POA: Diagnosis not present

## 2015-11-01 DIAGNOSIS — H524 Presbyopia: Secondary | ICD-10-CM | POA: Diagnosis not present

## 2015-11-01 DIAGNOSIS — H52223 Regular astigmatism, bilateral: Secondary | ICD-10-CM | POA: Diagnosis not present

## 2015-12-20 ENCOUNTER — Ambulatory Visit: Payer: Self-pay | Admitting: Internal Medicine

## 2016-01-06 DIAGNOSIS — R05 Cough: Secondary | ICD-10-CM | POA: Diagnosis not present

## 2016-01-06 DIAGNOSIS — B9689 Other specified bacterial agents as the cause of diseases classified elsewhere: Secondary | ICD-10-CM | POA: Diagnosis not present

## 2016-01-06 DIAGNOSIS — J019 Acute sinusitis, unspecified: Secondary | ICD-10-CM | POA: Diagnosis not present

## 2016-01-06 DIAGNOSIS — H6983 Other specified disorders of Eustachian tube, bilateral: Secondary | ICD-10-CM | POA: Diagnosis not present

## 2016-02-08 ENCOUNTER — Encounter: Payer: Self-pay | Admitting: Internal Medicine

## 2016-02-08 ENCOUNTER — Ambulatory Visit (INDEPENDENT_AMBULATORY_CARE_PROVIDER_SITE_OTHER): Payer: 59 | Admitting: Internal Medicine

## 2016-02-08 VITALS — BP 120/80 | HR 84 | Temp 97.1°F | Resp 16 | Ht 69.0 in | Wt 238.2 lb

## 2016-02-08 DIAGNOSIS — J0140 Acute pansinusitis, unspecified: Secondary | ICD-10-CM | POA: Diagnosis not present

## 2016-02-08 DIAGNOSIS — J209 Acute bronchitis, unspecified: Secondary | ICD-10-CM | POA: Diagnosis not present

## 2016-02-08 MED ORDER — AZITHROMYCIN 250 MG PO TABS: ORAL_TABLET | ORAL | 1 refills | Status: DC

## 2016-02-08 MED ORDER — PREDNISONE 20 MG PO TABS
ORAL_TABLET | ORAL | 0 refills | Status: DC
Start: 2016-02-08 — End: 2016-04-23

## 2016-02-08 NOTE — Patient Instructions (Signed)

## 2016-02-08 NOTE — Progress Notes (Signed)
[facility]   Unk Pinto, M.D.    Uvaldo Bristle. Silverio Lay, P.A.-C      Starlyn Skeans, P.A.-C  [facility]                [address] [place], [place] [address] Telephone [phone] Telefax [phone] Subjective:    Patient ID: Javier Vega, male    DOB: 11/18/1960, 55 y.o.   MRN: [id number]  HPI  This very nice 55 yo MWM presented with 1-2 week hx/o head & chest congestion . Has had some putrid sputum, but denies any fever,chills, sweats or rashes.   Medication Sig  . Ascorbic Acid (VITAMIN C PO) Take by mouth daily.  Marland Kitchen aspirin 81 MG chewable tablet Chew 81 mg by mouth daily.  Marland Kitchen atorvastatin (LIPITOR) 80 MG tablet TAKE 1/2 TO 1 TABLET BY MOUTH EACH DAY   . Cholecalciferol (VITAMIN D PO) Take 8,000 Int'l Units by mouth daily.  . Cyanocobalamin (VITAMIN B 12 PO) Take by mouth daily.  Marland Kitchen FLONASE 50nasal spray Place 2 sprays into both nostrils at bedtime.  Marland Kitchen XYZAL 5 MG tablet Take 5 mg by mouth every evening.  . Multiple Vitamins-Minerals  Take by mouth daily.  . Omega-3 FISH OIL  Take by mouth 3 (three) times daily.    Allergies  Allergen Reactions  . Crestor [Rosuvastatin]    Past Medical History:  Diagnosis Date  . Allergy    SEASONAL  . Anal fissure   . Arthritis   . Hyperlipidemia   . Hypertension   . Sleep apnea   . Vitamin D deficiency    Review of Systems  10 point systems review negative except as above.    Objective:   Physical Exam  BP 120/80   Pulse 84   Temp 97.1 F (36.2 C)   Resp 16   Ht '5\' 9"'$  (1.753 m)   Wt 238 lb 3.2 oz (108 kg)   BMI 35.18 kg/m   Skin - clear.  HEENT - Eac's patent. TM's Nl. EOM's full. PERRLA.  Sl frontal & maxillary tenderness.NasoOroPharynx clear. Neck - supple. Nl Thyroid. Carotids 2+ & No bruits, nodes, JVD Chest - Few scattered rales and no rhonchi or wheezes. Cor - Nl HS. RRR w/o sig MGR.  MS- FROM w/o deformities. Muscle power,  tone and bulk Nl. Gait Nl. Neuro - Nl w/o focal abnormalities    Assessment & Plan:   1. Acute pansinusitis, recurrence not specified  - predniSONE (DELTASONE) 20 MG tablet; 1 tab 3 x day for 3 days, then 1 tab 2 x day for 3 days, then 1 tab 1 x day for 5 days  Dispense: 20 tablet; Refill: 0 - azithromycin (ZITHROMAX) 250 MG tablet; Take 2 tablets (500 mg) on  Day 1,  followed by 1 tablet (250 mg) once daily on Days 2 through 5.  Dispense: 6 each; Refill: 1  2. Acute bronchitis, unspecified organism  - predniSONE (DELTASONE) 20 MG tablet; 1 tab 3 x day for 3 days, then 1 tab 2 x day for 3 days, then 1 tab 1 x day for 5 days  Dispense: 20 tablet; Refill: 0  - azithromycin (ZITHROMAX) 250 MG tablet; Take 2 tablets (500 mg) on  Day 1,  followed by 1 tablet (250 mg) once daily on Days 2 through 5.  Dispense: 6 each; Refill: 1  -  discussed meds & SE's - ROV prn.

## 2016-02-13 ENCOUNTER — Other Ambulatory Visit: Payer: Self-pay | Admitting: Internal Medicine

## 2016-02-13 MED ORDER — LEVOFLOXACIN 500 MG PO TABS
500.0000 mg | ORAL_TABLET | Freq: Every day | ORAL | 0 refills | Status: AC
Start: 2016-02-13 — End: 2016-02-23

## 2016-02-13 MED ORDER — PROMETHAZINE-DM 6.25-15 MG/5ML PO SYRP: ORAL_SOLUTION | ORAL | 1 refills | Status: DC

## 2016-03-18 ENCOUNTER — Ambulatory Visit (INDEPENDENT_AMBULATORY_CARE_PROVIDER_SITE_OTHER): Payer: 59 | Admitting: Internal Medicine

## 2016-03-18 ENCOUNTER — Encounter: Payer: Self-pay | Admitting: Internal Medicine

## 2016-03-18 VITALS — BP 128/70 | HR 68 | Temp 98.0°F | Wt 240.0 lb

## 2016-03-18 DIAGNOSIS — R7303 Prediabetes: Secondary | ICD-10-CM | POA: Diagnosis not present

## 2016-03-18 DIAGNOSIS — Z79899 Other long term (current) drug therapy: Secondary | ICD-10-CM

## 2016-03-18 DIAGNOSIS — E559 Vitamin D deficiency, unspecified: Secondary | ICD-10-CM | POA: Diagnosis not present

## 2016-03-18 DIAGNOSIS — I10 Essential (primary) hypertension: Secondary | ICD-10-CM | POA: Diagnosis not present

## 2016-03-18 DIAGNOSIS — E782 Mixed hyperlipidemia: Secondary | ICD-10-CM

## 2016-03-18 LAB — CBC WITH DIFFERENTIAL/PLATELET
BASOS PCT: 1 %
Basophils Absolute: 88 cells/uL (ref 0–200)
EOS PCT: 2 %
Eosinophils Absolute: 176 cells/uL (ref 15–500)
HCT: 46.7 % (ref 38.5–50.0)
HEMOGLOBIN: 16 g/dL (ref 13.2–17.1)
LYMPHS ABS: 2816 {cells}/uL (ref 850–3900)
Lymphocytes Relative: 32 %
MCH: 31.9 pg (ref 27.0–33.0)
MCHC: 34.3 g/dL (ref 32.0–36.0)
MCV: 93 fL (ref 80.0–100.0)
MONOS PCT: 9 %
MPV: 10.7 fL (ref 7.5–12.5)
Monocytes Absolute: 792 cells/uL (ref 200–950)
NEUTROS ABS: 4928 {cells}/uL (ref 1500–7800)
Neutrophils Relative %: 56 %
PLATELETS: 233 10*3/uL (ref 140–400)
RBC: 5.02 MIL/uL (ref 4.20–5.80)
RDW: 13.8 % (ref 11.0–15.0)
WBC: 8.8 10*3/uL (ref 3.8–10.8)

## 2016-03-18 NOTE — Patient Instructions (Signed)
Please take 1 capsule of red yeast rice in the morning with your 2 fish oil capsule.    Please use anoro once daily.  Rinse your mouth out after you use.

## 2016-03-18 NOTE — Progress Notes (Signed)
Patient ID: Javier Vega, male   DOB: Aug 05, 1960, 55 y.o.   MRN: [id number]  Assessment and Plan:  Hypertension:  -Continue medication,  -monitor blood pressure at home.  -Continue DASH diet.   -Reminder to go to the ER if any CP, SOB, nausea, dizziness, severe HA, changes vision/speech, left arm numbness and tingling, and jaw pain.  Cholesterol: -patient refuses lipitor -add in red yeast rice -cont fish oil -Continue diet and exercise.  -Check cholesterol.   Pre-diabetes: -Continue diet and exercise.  -Check A1C  Vitamin D Def: -continue medications.   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 Further disposition pending results of labs.  HPI 55 y.o. male  presents for 3 month follow up with hypertension, hyperlipidemia, prediabetes and vitamin D.   His blood pressure has been controlled at home, today their BP is BP: 128/70.   He does not workout. He denies chest pain, shortness of breath, dizziness.   He is on cholesterol medication and denies myalgias. His cholesterol is at goal. The cholesterol last visit was:   Lab Results  Component Value Date   CHOL 217 (H) 09/14/2015   HDL 47 09/14/2015   LDLCALC 136 (H) 09/14/2015   TRIG 172 (H) 09/14/2015   CHOLHDL 4.6 09/14/2015  He does not take his lipitor.  He has been taking 3 tablets of fishoil daily.  He does not want to be on prescription medications.     He has been working on diet and exercise for prediabetes, and denies foot ulcerations, hyperglycemia, hypoglycemia , increased appetite, nausea, paresthesia of the feet, polydipsia, polyuria, visual disturbances, vomiting and weight loss. Last A1C in the office was:  Lab Results  Component Value Date   HGBA1C 5.6 06/15/2015    Patient is on Vitamin D supplement.  Lab Results  Component Value Date   VD25OH 44 06/15/2015      He is still smoking.  He  reports that he does get short of breath.  He reports that he has not used an inhaler recently.    Current Medications:  Current Outpatient Prescriptions on File Prior to Visit  Medication Sig Dispense Refill  . Ascorbic Acid (VITAMIN C PO) Take by mouth daily.    Marland Kitchen aspirin 81 MG chewable tablet Chew 81 mg by mouth daily.    Marland Kitchen atorvastatin (LIPITOR) 80 MG tablet TAKE 1/2 TO 1 TABLET BY MOUTH EACH DAY AS DIRECTED FOR CHOLESTEROL 90 tablet 1  . Cholecalciferol (VITAMIN D PO) Take 8,000 Int'l Units by mouth daily.    . Cyanocobalamin (VITAMIN B 12 PO) Take by mouth daily.    . fluticasone (FLONASE) 50 MCG/ACT nasal spray Place 2 sprays into both nostrils at bedtime. 16 g 1  . levocetirizine (XYZAL) 5 MG tablet Take 5 mg by mouth every evening.    . Multiple Vitamins-Minerals (MENS MULTIVITAMIN PLUS) TABS Take by mouth daily.    . Omega-3 Fatty Acids (FISH OIL PO) Take by mouth 3 (three) times daily.     . predniSONE (DELTASONE) 20 MG tablet 1 tab 3 x day for 3 days, then 1 tab 2 x day for 3 days, then 1 tab 1 x day for 5 days (Patient not taking: Reported on 03/18/2016) 20 tablet 0  . promethazine-dextromethorphan (PROMETHAZINE-DM) 6.25-15 MG/5ML syrup Take 1 to 2 tsp enery 4 hours if needed for cough (Patient not  taking: Reported on 03/18/2016) 360 mL 1   No current facility-administered medications on file prior to visit.     Medical History:  Past Medical History:  Diagnosis Date  . Allergy    SEASONAL  . Anal fissure   . Arthritis   . Hyperlipidemia   . Hypertension   . Sleep apnea   . Vitamin D deficiency     Allergies:  Allergies  Allergen Reactions  . Crestor [Rosuvastatin]      Review of Systems:  Review of Systems  Constitutional: Negative for chills, fever and malaise/fatigue.  HENT: Negative for congestion, ear pain and sore throat.   Eyes: Negative.   Respiratory: Negative for cough, shortness of breath and wheezing.   Cardiovascular: Negative for chest pain,  palpitations and leg swelling.  Gastrointestinal: Negative for abdominal pain, blood in stool, constipation, diarrhea, heartburn and melena.  Genitourinary: Negative.   Skin: Negative.   Neurological: Negative for dizziness, sensory change, loss of consciousness and headaches.  Psychiatric/Behavioral: Negative for depression. The patient is not nervous/anxious and does not have insomnia.     Family history- Review and unchanged  Social history- Review and unchanged  Physical Exam: BP 128/70   Pulse 68   Temp 98 F (36.7 C)   Wt 240 lb (108.9 kg)   BMI 35.44 kg/m  Wt Readings from Last 3 Encounters:  03/18/16 240 lb (108.9 kg)  02/08/16 238 lb 3.2 oz (108 kg)  10/06/15 238 lb (108 kg)    General Appearance: Well nourished well developed, in no apparent distress. Eyes: PERRLA, EOMs, conjunctiva no swelling or erythema ENT/Mouth: Ear canals normal without obstruction, swelling, erythma, discharge.  TMs normal bilaterally.  Oropharynx moist, clear, without exudate, or postoropharyngeal swelling. Neck: Supple, thyroid normal,no cervical adenopathy  Respiratory: Respiratory effort normal, Breath sounds clear A&P without rhonchi, wheeze, or rale.  No retractions, no accessory usage. Cardio: RRR with no MRGs. Brisk peripheral pulses without edema.  Abdomen: Soft, + BS,  Non tender, no guarding, rebound, hernias, masses. Musculoskeletal: Full ROM, 5/5 strength, Normal gait Skin: Warm, dry without rashes, lesions, ecchymosis.  Neuro: Awake and oriented X 3, Cranial nerves intact. Normal muscle tone, no cerebellar symptoms. Psych: Normal affect, Insight and Judgment appropriate.    Starlyn Skeans, PA-C 2:56 PM [facility]

## 2016-03-19 LAB — HEPATIC FUNCTION PANEL
ALT: 47 U/L — AB (ref 9–46)
AST: 40 U/L — AB (ref 10–35)
Albumin: 4.4 g/dL (ref 3.6–5.1)
Alkaline Phosphatase: 65 U/L (ref 40–115)
BILIRUBIN DIRECT: 0.1 mg/dL (ref <=0.2)
BILIRUBIN INDIRECT: 0.2 mg/dL (ref 0.2–1.2)
BILIRUBIN TOTAL: 0.3 mg/dL (ref 0.2–1.2)
Total Protein: 6.7 g/dL (ref 6.1–8.1)

## 2016-03-19 LAB — BASIC METABOLIC PANEL WITH GFR
BUN: 19 mg/dL (ref 7–25)
CHLORIDE: 104 mmol/L (ref 98–110)
CO2: 24 mmol/L (ref 20–31)
CREATININE: 0.84 mg/dL (ref 0.70–1.33)
Calcium: 9.3 mg/dL (ref 8.6–10.3)
GFR, Est African American: >89 mL/min (ref >=60)
Glucose, Bld: 93 mg/dL (ref 65–99)
Potassium: 3.5 mmol/L (ref 3.5–5.3)
SODIUM: 138 mmol/L (ref 135–146)

## 2016-03-19 LAB — LIPID PANEL
CHOL/HDL RATIO: 7.5 ratio — AB (ref <5.0)
CHOLESTEROL: 226 mg/dL — AB (ref <200)
HDL: 30 mg/dL — ABNORMAL LOW (ref >40)
TRIGLYCERIDES: 457 mg/dL — AB (ref <150)

## 2016-03-19 LAB — TSH: TSH: 1.48 mIU/L (ref 0.40–4.50)

## 2016-03-19 LAB — HEMOGLOBIN A1C
HEMOGLOBIN A1C: 5.4 % (ref <5.7)
MEAN PLASMA GLUCOSE: 108 mg/dL

## 2016-03-20 ENCOUNTER — Other Ambulatory Visit: Payer: Self-pay | Admitting: Internal Medicine

## 2016-03-25 DIAGNOSIS — L309 Dermatitis, unspecified: Secondary | ICD-10-CM | POA: Diagnosis not present

## 2016-03-25 DIAGNOSIS — L821 Other seborrheic keratosis: Secondary | ICD-10-CM | POA: Diagnosis not present

## 2016-03-25 DIAGNOSIS — L570 Actinic keratosis: Secondary | ICD-10-CM | POA: Diagnosis not present

## 2016-03-25 DIAGNOSIS — B079 Viral wart, unspecified: Secondary | ICD-10-CM | POA: Diagnosis not present

## 2016-03-25 DIAGNOSIS — L853 Xerosis cutis: Secondary | ICD-10-CM | POA: Diagnosis not present

## 2016-04-05 ENCOUNTER — Other Ambulatory Visit: Payer: Self-pay | Admitting: Internal Medicine

## 2016-04-05 MED ORDER — UMECLIDINIUM-VILANTEROL 62.5-25 MCG/INH IN AEPB: 1.0000 | INHALATION_SPRAY | Freq: Every day | RESPIRATORY_TRACT | 2 refills | Status: DC

## 2016-04-17 ENCOUNTER — Other Ambulatory Visit: Payer: Self-pay | Admitting: *Deleted

## 2016-04-17 DIAGNOSIS — B349 Viral infection, unspecified: Secondary | ICD-10-CM

## 2016-04-17 MED ORDER — FLUTICASONE PROPIONATE 50 MCG/ACT NA SUSP: 2.0000 | Freq: Every day | NASAL | 1 refills | Status: DC

## 2016-04-19 ENCOUNTER — Ambulatory Visit: Payer: Self-pay | Admitting: Internal Medicine

## 2016-04-20 DIAGNOSIS — J0141 Acute recurrent pansinusitis: Secondary | ICD-10-CM | POA: Diagnosis not present

## 2016-04-23 ENCOUNTER — Ambulatory Visit (INDEPENDENT_AMBULATORY_CARE_PROVIDER_SITE_OTHER): Payer: 59 | Admitting: Internal Medicine

## 2016-04-23 ENCOUNTER — Encounter: Payer: Self-pay | Admitting: Internal Medicine

## 2016-04-23 VITALS — BP 110/60 | HR 86 | Temp 98.2°F | Resp 18 | Ht 69.0 in | Wt 242.0 lb

## 2016-04-23 DIAGNOSIS — J449 Chronic obstructive pulmonary disease, unspecified: Secondary | ICD-10-CM

## 2016-04-23 DIAGNOSIS — R1013 Epigastric pain: Secondary | ICD-10-CM

## 2016-04-23 DIAGNOSIS — H9203 Otalgia, bilateral: Secondary | ICD-10-CM | POA: Diagnosis not present

## 2016-04-23 DIAGNOSIS — M1990 Unspecified osteoarthritis, unspecified site: Secondary | ICD-10-CM

## 2016-04-23 MED ORDER — MELOXICAM 15 MG PO TABS: 15.0000 mg | ORAL_TABLET | Freq: Every day | ORAL | 2 refills | Status: DC

## 2016-04-23 MED ORDER — AZELASTINE HCL 0.1 % NA SOLN: 2.0000 | Freq: Two times a day (BID) | NASAL | 2 refills | Status: DC

## 2016-04-23 MED ORDER — OMEPRAZOLE 40 MG PO CPDR: 40.0000 mg | DELAYED_RELEASE_CAPSULE | Freq: Every day | ORAL | 1 refills | Status: DC

## 2016-04-23 NOTE — Patient Instructions (Signed)
For ear pain please continue to use the flonase,xyzal, and add in 2 sprays per nostril of the astelin and 25 mg of benadryl daily.  Please take the meloxicam daily with food for arthritis.  Please do not take ibuprofen, advil aleve, naproxen or motrin with it.  You can take tylenol with it as needed.  Please take omeprazole first thing in the morning on an empty stomach.  Please wait 30 minutes prior to eating.

## 2016-04-23 NOTE — Progress Notes (Signed)
Subjective:    Patient ID: Javier Vega, male    DOB: 07/21/1960, 55 y.o.   MRN: [id number]  HPI  Patient presents to the office for evaluation of bilateral ear pains.  He reports that he did go to [facility] and had an injections of rocephin and also decadron.  Since that time he has been feeling a little bit better.  He reports that he is taking flonase and is also doing xyzal.  He is not taking any other medications.    He reports that he cannot afford the anoro inhaler. He tried to pick it up and it was $100 for an inhaler.  He also reports that he is having some right lower quadrant pain.  He was recently coughing a lot.  He reports that the pain in his stomach is mild, intermittent, and radiates across the entire lower abdomen.  He reports that it is exacerbated by eating.  He does feel slightly better with tums.  He is working on quitting smoking.  He has not tried any acid reflux medications.  He denies gas and belching.  He reports that the tums hels significantly.  Bowel movements are regular.  He has not had blood in his stool or black colored stools.     He would like to be on prednisone for a longer period of time as his joints are really bothering him.  When he takes it it makes his joints feel completely better.  He is still smoking.      Review of Systems  Constitutional: Negative for chills, fatigue and fever.  HENT: Positive for congestion, ear pain and postnasal drip. Negative for rhinorrhea and sore throat.   Respiratory: Negative for chest tightness and shortness of breath.   Cardiovascular: Negative for chest pain, palpitations and leg swelling.  Gastrointestinal: Negative for nausea and vomiting.       Objective:   Physical Exam  Constitutional: He is oriented to person, place, and time. He appears well-developed and well-nourished. No distress.  HENT:  Head: Normocephalic.  Mouth/Throat: Oropharynx is clear and moist. No oropharyngeal exudate.  Eyes:  Conjunctivae are normal. No scleral icterus.  Neck: Normal range of motion. Neck supple. No JVD present. No thyromegaly present.  Cardiovascular: Normal rate, regular rhythm, normal heart sounds and intact distal pulses.  Exam reveals no gallop and no friction rub.   No murmur heard. Pulmonary/Chest: Effort normal and breath sounds normal. No respiratory distress. He has no wheezes. He has no rales. He exhibits no tenderness.  Abdominal: Soft. Normal appearance and bowel sounds are normal. He exhibits no distension and no mass. There is tenderness in the right upper quadrant. There is no rigidity, no rebound, no guarding, no CVA tenderness, no tenderness at McBurney's point and negative Murphy's sign.  Minimal RUQ pain  Musculoskeletal: Normal range of motion.  Lymphadenopathy:    He has no cervical adenopathy.  Neurological: He is alert and oriented to person, place, and time. No cranial nerve deficit. Coordination normal.  Skin: Skin is warm and dry. No rash noted. He is not diaphoretic.  Psychiatric: He has a normal mood and affect. His behavior is normal. Judgment and thought content normal.  Nursing note and vitals reviewed.   Vitals:   04/23/16 1558  BP: 110/60  Pulse: 86  Resp: 18  Temp: 98.2 F (36.8 C)         Assessment & Plan:    1. Otalgia of both ears -flonase -xyzal -astelin -benadryl  at bedtime -likely from very mild effusion which has improved with prednisone use  2. Arthritis -meloxicam once daily with food -BMET 02/2016 reviewed with normal renal function -okay to take tylenol with it -patient was told he cannot stay longterm on prednisone due to cushings, osteoporosis, etc.  3. Abdominal pain, epigastric -GERD vs. Possible cholecystitis -try omeprazole once daily x 1 month -if continued pain will need RUQ ultrasound  4. Chronic obstructive pulmonary disease, unspecified COPD type ([facility]) -anoro prescription card given to help with cost.

## 2016-04-28 DIAGNOSIS — R509 Fever, unspecified: Secondary | ICD-10-CM | POA: Diagnosis not present

## 2016-04-28 DIAGNOSIS — R1084 Generalized abdominal pain: Secondary | ICD-10-CM | POA: Diagnosis not present

## 2016-04-28 DIAGNOSIS — J0141 Acute recurrent pansinusitis: Secondary | ICD-10-CM | POA: Diagnosis not present

## 2016-05-03 IMAGING — CR DG ORBITS FOR FOREIGN BODY
2 series · 2 of 2 positions shown · non-contrast
Comparison: None.

CLINICAL DATA: Metal working/exposure; clearance prior to MRI

EXAM:
ORBITS FOR FOREIGN BODY - 2 VIEW

[view not recorded (1 of 2)]
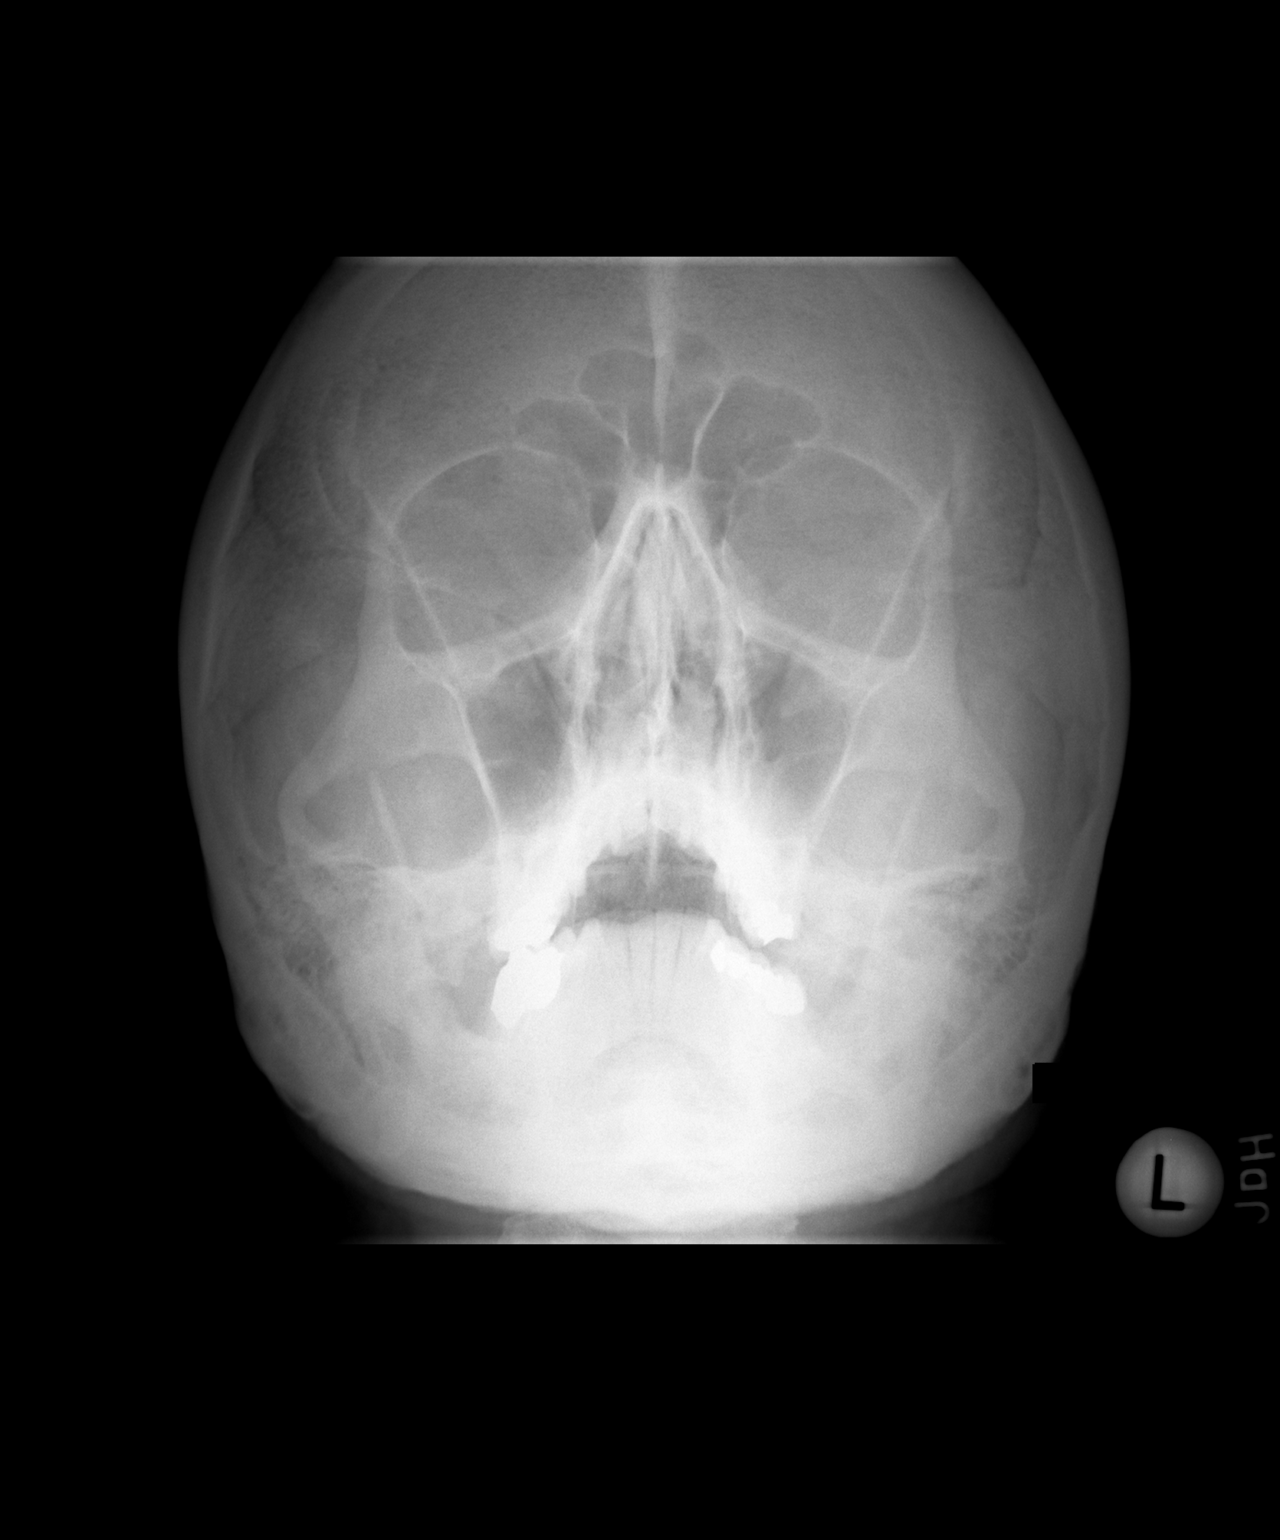

[view not recorded (2 of 2)]
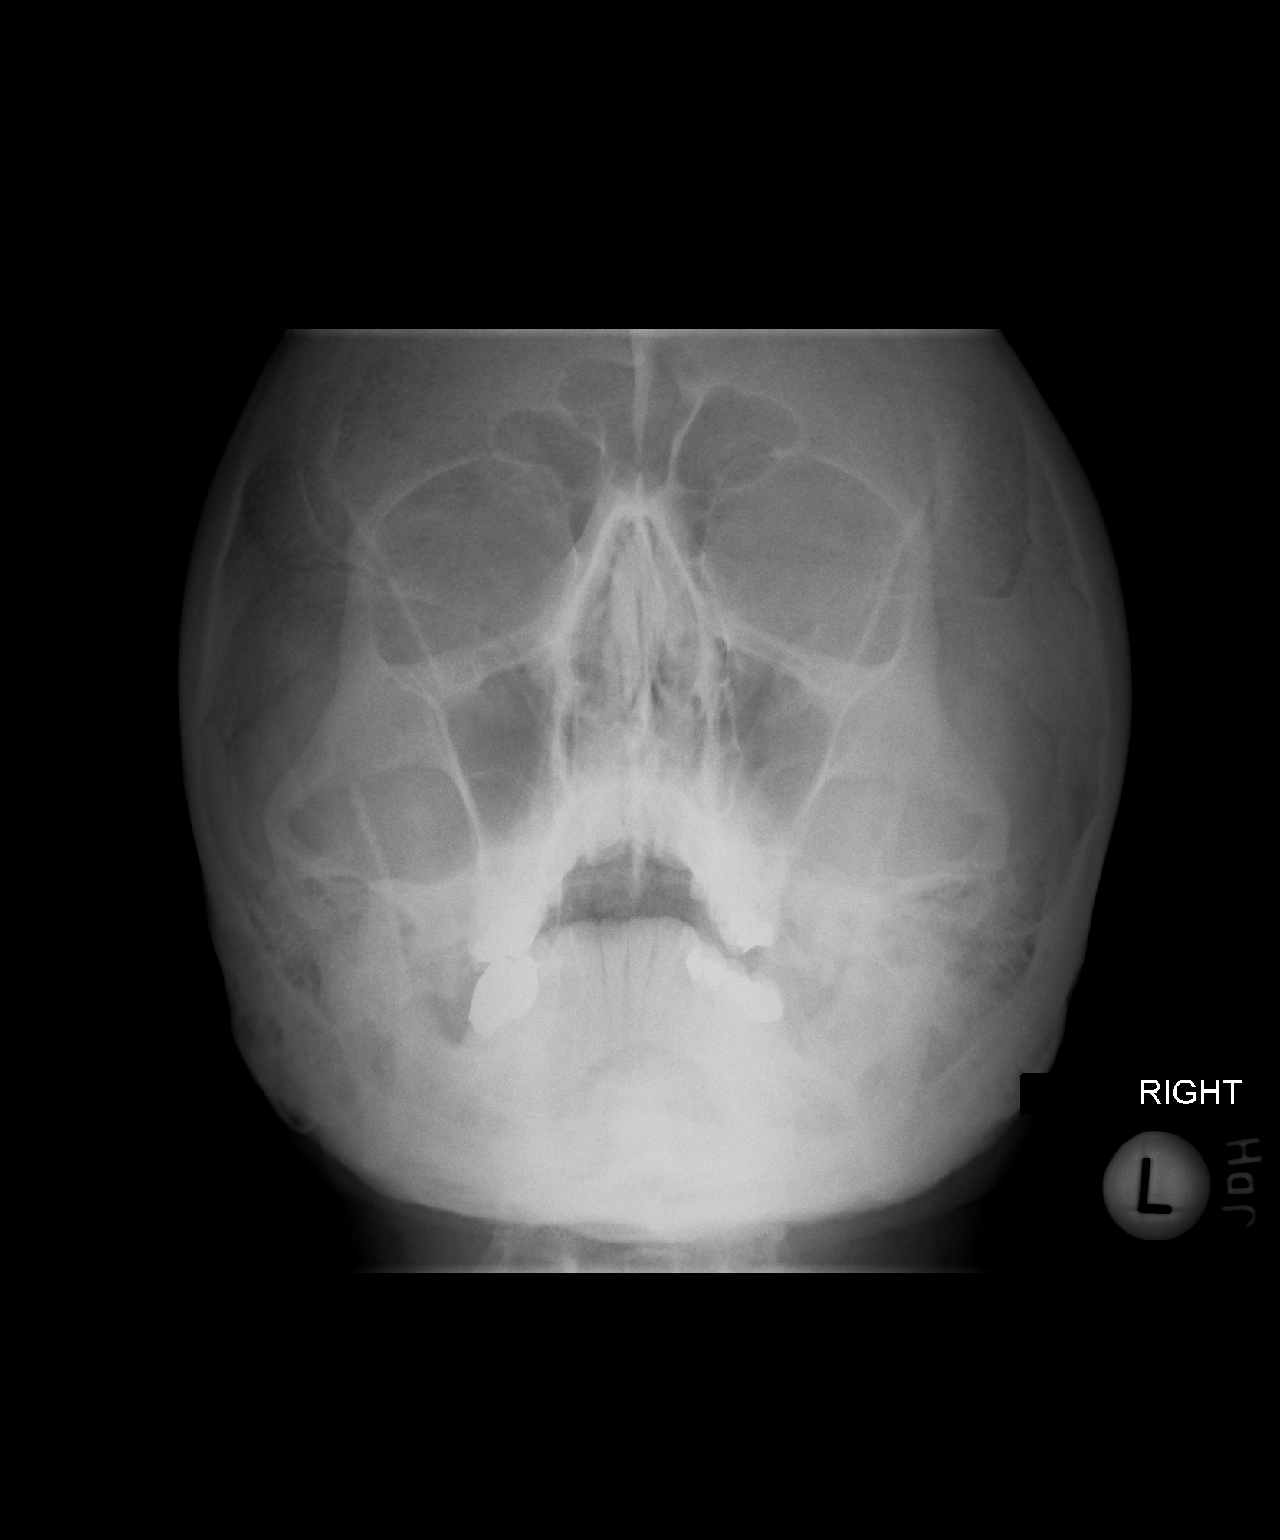

[2 of 2 positions shown; findings below may reference images not displayed]

FINDINGS: There is no evidence of metallic foreign body within the orbits. No
significant bone abnormality identified.
IMPRESSION: No evidence of metallic foreign body within the orbits.

## 2016-05-06 DIAGNOSIS — H9201 Otalgia, right ear: Secondary | ICD-10-CM | POA: Diagnosis not present

## 2016-06-08 DIAGNOSIS — S83241A Other tear of medial meniscus, current injury, right knee, initial encounter: Secondary | ICD-10-CM | POA: Diagnosis not present

## 2016-06-24 ENCOUNTER — Encounter: Payer: Self-pay | Admitting: Internal Medicine

## 2016-06-24 ENCOUNTER — Other Ambulatory Visit: Payer: Self-pay | Admitting: Internal Medicine

## 2016-06-24 DIAGNOSIS — B349 Viral infection, unspecified: Secondary | ICD-10-CM

## 2016-07-03 ENCOUNTER — Ambulatory Visit (INDEPENDENT_AMBULATORY_CARE_PROVIDER_SITE_OTHER): Payer: 59 | Admitting: Internal Medicine

## 2016-07-03 ENCOUNTER — Encounter: Payer: Self-pay | Admitting: Internal Medicine

## 2016-07-03 DIAGNOSIS — B349 Viral infection, unspecified: Secondary | ICD-10-CM | POA: Diagnosis not present

## 2016-07-03 MED ORDER — PREDNISONE 20 MG PO TABS: ORAL_TABLET | ORAL | 0 refills | Status: DC

## 2016-07-03 MED ORDER — PROMETHAZINE-PHENYLEPHRINE 6.25-5 MG/5ML PO SYRP: 5.0000 mL | ORAL_SOLUTION | Freq: Four times a day (QID) | ORAL | 0 refills | Status: DC | PRN

## 2016-07-03 MED ORDER — MECLIZINE HCL 25 MG PO TABS: 25.0000 mg | ORAL_TABLET | Freq: Three times a day (TID) | ORAL | 0 refills | Status: DC | PRN

## 2016-07-03 MED ORDER — FLUTICASONE PROPIONATE 50 MCG/ACT NA SUSP: 2.0000 | Freq: Every day | NASAL | 3 refills | Status: DC

## 2016-07-03 MED ORDER — RANITIDINE HCL 300 MG PO TABS: 300.0000 mg | ORAL_TABLET | Freq: Every day | ORAL | 1 refills | Status: DC

## 2016-07-03 NOTE — Patient Instructions (Signed)
Please take astelin 2 sprays per nostril morning and evening.  Please use 2 sprays of flonase per nostril at bedtime.  Please continue to take xyzal once daily.  Take meclizine up to 3 times daily for dizziness and nausea.  Take promethazine VC up to 4 times daily.  Avoid over the counter sudafed.  Please take prednisone taper until gone.  Please take ranitidine twice daily to stop queasiness.  Please call if ino improvement.

## 2016-07-03 NOTE — Progress Notes (Signed)
HPI  Patient presents to the office for evaluation of cough.  It has been going on for 1 days.  Patient reports minimal coughing thus far.  They also endorse change in voice, postnasal drip and nasal congestion, ear pain, and mild upset stomach.  .  They have tried flonase, astelin, xyzal.  They report that nothing has worked.  They admits to other sick contacts.  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    Review of Systems  Constitutional: Positive for malaise/fatigue. Negative for chills and fever.  HENT: Positive for congestion, ear pain, hearing loss and sore throat.   Respiratory: Positive for cough. Negative for sputum production, shortness of breath and wheezing.   Cardiovascular: Negative for chest pain, palpitations and leg swelling.  Neurological: Positive for headaches.    PE:  Vitals:   07/03/16 0934  BP: 114/64  Pulse: 86  Temp: 98 F (36.7 C)    General:  Alert and non-toxic, WDWN, NAD HEENT: NCAT, PERLA, EOM normal, no occular discharge or erythema.  Nasal mucosal edema with sinus tenderness to palpation.  Oropharynx clear with minimal oropharyngeal edema and erythema.  Mucous membranes moist and pink. Neck:  Cervical adenopathy Chest:  RRR no MRGs.  Lungs clear to auscultation A&P with no wheezes rhonchi or rales.   Abdomen: +BS x 4 quadrants, soft, non-tender, no guarding, rigidity, or rebound. Skin: warm and dry no rash Neuro: A&Ox4, CN II-XII grossly intact  Assessment and Plan:   1. Viral syndrome -likely virus vs. Allergic rhinitis -dizziness due to middle ear effusion and patient has had good relief from meclizine in the past for this -nasal saline vs. netti pot -elevate head during sleep -daily antihistamine tablet -no need for abx -recommended patient quit smoking which he reports that he is not ready to do. - meclizine (ANTIVERT) 25 MG tablet; Take 1 tablet (25 mg total) by mouth 3 (three) times daily as needed for dizziness.   Dispense: 90 tablet; Refill: 0 - ranitidine (ZANTAC) 300 MG tablet; Take 1 tablet (300 mg total) by mouth at bedtime.  Dispense: 30 tablet; Refill: 1 - fluticasone (FLONASE) 50 MCG/ACT nasal spray; Place 2 sprays into both nostrils at bedtime.  Dispense: 16 g; Refill: 3 - predniSONE (DELTASONE) 20 MG tablet; 3 tabs po daily x 3 days, then 2 tabs x 3 days, then 1.5 tabs x 3 days, then 1 tab x 3 days, then 0.5 tabs x 3 days  Dispense: 27 tablet; Refill: 0 - promethazine-phenylephrine (PROMETHAZINE VC) 6.25-5 MG/5ML SYRP; Take 5 mLs by mouth every 6 (six) hours as needed for congestion.  Dispense: 280 mL; Refill: 0

## 2016-07-04 DIAGNOSIS — M9903 Segmental and somatic dysfunction of lumbar region: Secondary | ICD-10-CM | POA: Diagnosis not present

## 2016-07-04 DIAGNOSIS — M546 Pain in thoracic spine: Secondary | ICD-10-CM | POA: Diagnosis not present

## 2016-07-04 DIAGNOSIS — M545 Low back pain: Secondary | ICD-10-CM | POA: Diagnosis not present

## 2016-07-04 DIAGNOSIS — M9902 Segmental and somatic dysfunction of thoracic region: Secondary | ICD-10-CM | POA: Diagnosis not present

## 2016-07-30 ENCOUNTER — Ambulatory Visit (INDEPENDENT_AMBULATORY_CARE_PROVIDER_SITE_OTHER): Payer: 59 | Admitting: Internal Medicine

## 2016-07-30 ENCOUNTER — Encounter: Payer: Self-pay | Admitting: Internal Medicine

## 2016-07-30 VITALS — BP 140/76 | HR 88 | Temp 97.5°F | Resp 16 | Ht 69.0 in | Wt 241.6 lb

## 2016-07-30 DIAGNOSIS — Z136 Encounter for screening for cardiovascular disorders: Secondary | ICD-10-CM | POA: Diagnosis not present

## 2016-07-30 DIAGNOSIS — Z125 Encounter for screening for malignant neoplasm of prostate: Secondary | ICD-10-CM

## 2016-07-30 DIAGNOSIS — R7303 Prediabetes: Secondary | ICD-10-CM

## 2016-07-30 DIAGNOSIS — E782 Mixed hyperlipidemia: Secondary | ICD-10-CM

## 2016-07-30 DIAGNOSIS — I10 Essential (primary) hypertension: Secondary | ICD-10-CM

## 2016-07-30 DIAGNOSIS — E559 Vitamin D deficiency, unspecified: Secondary | ICD-10-CM | POA: Diagnosis not present

## 2016-07-30 DIAGNOSIS — Z9989 Dependence on other enabling machines and devices: Secondary | ICD-10-CM

## 2016-07-30 DIAGNOSIS — Z0001 Encounter for general adult medical examination with abnormal findings: Secondary | ICD-10-CM

## 2016-07-30 DIAGNOSIS — Z1212 Encounter for screening for malignant neoplasm of rectum: Secondary | ICD-10-CM

## 2016-07-30 DIAGNOSIS — E349 Endocrine disorder, unspecified: Secondary | ICD-10-CM | POA: Diagnosis not present

## 2016-07-30 DIAGNOSIS — R5383 Other fatigue: Secondary | ICD-10-CM | POA: Diagnosis not present

## 2016-07-30 DIAGNOSIS — Z0000 Encounter for general adult medical examination without abnormal findings: Secondary | ICD-10-CM | POA: Diagnosis not present

## 2016-07-30 DIAGNOSIS — G4733 Obstructive sleep apnea (adult) (pediatric): Secondary | ICD-10-CM

## 2016-07-30 DIAGNOSIS — Z79899 Other long term (current) drug therapy: Secondary | ICD-10-CM | POA: Diagnosis not present

## 2016-07-30 LAB — CBC WITH DIFFERENTIAL/PLATELET
BASOS ABS: 0 {cells}/uL (ref 0–200)
Basophils Relative: 0 %
EOS ABS: 246 {cells}/uL (ref 15–500)
Eosinophils Relative: 2 %
HCT: 48.4 % (ref 38.5–50.0)
Hemoglobin: 16.4 g/dL (ref 13.2–17.1)
LYMPHS PCT: 27 %
Lymphs Abs: 3321 cells/uL (ref 850–3900)
MCH: 31.2 pg (ref 27.0–33.0)
MCHC: 33.9 g/dL (ref 32.0–36.0)
MCV: 92.2 fL (ref 80.0–100.0)
MONOS PCT: 9 %
MPV: 10.8 fL (ref 7.5–12.5)
Monocytes Absolute: 1107 cells/uL — ABNORMAL HIGH (ref 200–950)
NEUTROS ABS: 7626 {cells}/uL (ref 1500–7800)
Neutrophils Relative %: 62 %
PLATELETS: 230 10*3/uL (ref 140–400)
RBC: 5.25 MIL/uL (ref 4.20–5.80)
RDW: 14 % (ref 11.0–15.0)
WBC: 12.3 10*3/uL — ABNORMAL HIGH (ref 3.8–10.8)

## 2016-07-30 NOTE — Progress Notes (Signed)
ADULT & ADOLESCENT INTERNAL MEDICINE   Unk Pinto, M.D.    Uvaldo Bristle. Silverio Lay, P.A.-C      Starlyn Skeans, P.A.-C  [facility]                [address] [place] [address] Telephone [phone] Telefax [phone] Annual  Screening/Preventative Visit  & Comprehensive Evaluation & Examination     This very nice 56 y.o. MWM  presents for a Screening/Preventative Visit & comprehensive evaluation and management of multiple medical co-morbidities.  Patient has been followed for HTN, Prediabetes, Hyperlipidemia and Vitamin D Deficiency. Patient has hx/o OSA and was intolerant to mask and O2.      Patient had Colonoscopy in July 2016 and was recommended f/u in 3 years.     HTN predates since 2008. Patient's BP has been controlled at home.  Today's BP is high normal -  140/76. Patient denies any cardiac symptoms as chest pain, palpitations, shortness of breath, dizziness or ankle swelling.     Patient's hyperlipidemia is not controlled with diet and medications. Patient denies myalgias or other medication SE's. Last lipids were not at goal: Lab Results  Component Value Date   CHOL 226 (H) 03/18/2016   HDL 30 (L) 03/18/2016   TRIG 457 (H) 03/18/2016   CHOLHDL 7.5 (H) 03/18/2016      Patient has Morbid Obesity (BMI 35+) and consequent prediabetes (A1c 5.7% in 2010 and 5.8% in 2016)  and patient denies reactive hypoglycemic symptoms, visual blurring, diabetic polys or paresthesias. Last A1c was at goal: Lab Results  Component Value Date   HGBA1C 5.4 03/18/2016       Finally, patient has history of Vitamin D Deficiency ("17" in 2008) and last vitamin D was still low: Lab Results  Component Value Date   VD25OH 39 06/15/2015   Current Outpatient Prescriptions on File Prior to Visit  Medication Sig  . VITAMIN C  Take by mouth daily.  Marland Kitchen aspirin 81 MG  Chew 81 mg by mouth daily.  Marland Kitchen atorvastatin  80 MG tablet TAKE  1/2 TO 1 TAB EACH DAY AS DIRECTED  . ASTELIN nasal spray Place 2 sprays into both nostrils 2 x  daily. Use in each nostril as directed  . VITAMIN D  Take 8,000 Int'l Units by mouth daily.  Marland Kitchen VITAMIN B 12 tab Take by mouth daily.  Marland Kitchen FLONASE  nasal spray Place 2 sprays into both nostrils at bedtime.  Marland Kitchen levocetirizine  5 MG tablet Take 5 mg by mouth every evening.  . meclizine  25 MG tablet Take 1 tablet (25 mg total) by mouth 3 (three) times daily as needed for dizziness.  . Multiple Vitamins-Minerals  Take by mouth daily.  . Omega-3 FISH OIL  Take by mouth 3 (three) times daily.   Marland Kitchen omeprazole  40 MG capsule Take 1 capsule (40 mg total) by mouth daily.  . ranitidine  300 MG tablet Take 1 tablet (300 mg total) by mouth at bedtime.   Allergies  Allergen Reactions  . Crestor [Rosuvastatin]    Past Medical History:  Diagnosis Date  . Allergy    SEASONAL  . Anal fissure   . Arthritis   . Hyperlipidemia   . Hypertension   . Sleep apnea   . Vitamin D deficiency    Health Maintenance  Topic Date Due  . TETANUS/TDAP  12/22/2015  .  INFLUENZA VACCINE  11/20/2016  . COLONOSCOPY  11/16/2017  . Hepatitis C Screening  Completed  . HIV Screening  Completed   Immunization History  Administered Date(s) Administered  . MMR 12/21/2005  . Pneumococcal-Unspecified 04/22/2009  . Tdap 12/21/2005   Past Surgical History:  Procedure Laterality Date  . CYSTO  2002   Family History  Problem Relation Age of Onset  . Adopted: Yes  . Colon cancer Neg Hx     Pt was adopted, does not know Fx  . Colon polyps Neg Hx    Social History   Social History  . Marital status: Married    Spouse name: N/A  . Number of children: 2  . Years of education: N/A   Occupational History  . Maintenance [employer]   Social History Main Topics  . Smoking status: Current Every Day Smoker    Packs/day: 0.25    Years: 36.00    Types: E-cigarettes  . Smokeless tobacco: Never Used  -- Comment: smokes e cigs  now  . Alcohol use 0.0 oz/week  - Comment: Occassionally  . Drug use: No  . Sexual activity: Active    ROS Constitutional: Denies fever, chills, weight loss/gain, headaches, insomnia,  night sweats or change in appetite. Does c/o fatigue. Eyes: Denies redness, blurred vision, diplopia, discharge, itchy or watery eyes.  ENT: Denies discharge, congestion, post nasal drip, epistaxis, sore throat, earache, hearing loss, dental pain, Tinnitus, Vertigo, Sinus pain or snoring.  Cardio: Denies chest pain, palpitations, irregular heartbeat, syncope, dyspnea, diaphoresis, orthopnea, PND, claudication or edema Respiratory: denies cough, dyspnea, DOE, pleurisy, hoarseness, laryngitis or wheezing.  Gastrointestinal: Denies dysphagia, heartburn, reflux, water brash, pain, cramps, nausea, vomiting, bloating, diarrhea, constipation, hematemesis, melena, hematochezia, jaundice or hemorrhoids Genitourinary: Denies dysuria, frequency, urgency, nocturia, hesitancy, discharge, hematuria or flank pain Musculoskeletal: Denies arthralgia, myalgia, stiffness, Jt. Swelling, pain, limp or strain/sprain. Denies Falls. Skin: Denies puritis, rash, hives, warts, acne, eczema or change in skin lesion Neuro: No weakness, tremor, incoordination, spasms, paresthesia or pain Psychiatric: Denies confusion, memory loss or sensory loss. Denies Depression. Endocrine: Denies change in weight, skin, hair change, nocturia, and paresthesia, diabetic polys, visual blurring or hyper / hypo glycemic episodes.  Heme/Lymph: No excessive bleeding, bruising or enlarged lymph nodes.  Physical Exam  BP 140/76   Pulse 88   Temp 97.5 F (36.4 C)   Resp 16   Ht 5' 9" (1.753 m)   Wt 241 lb 9.6 oz (109.6 kg)   BMI 35.68 kg/m   General Appearance: Over nourished and well groomed and in no apparent distress.  Eyes: PERRLA, EOMs, conjunctiva no swelling or erythema, normal fundi and vessels. Sinuses: No frontal/maxillary  tenderness ENT/Mouth: EACs patent / TMs  nl. Nares clear without erythema, swelling, mucoid exudates. Oral hygiene is good. No erythema, swelling, or exudate. Tongue normal, non-obstructing. Tonsils not swollen or erythematous. Hearing normal.  Neck: Supple, thyroid normal. No bruits, nodes or JVD. Respiratory: Respiratory effort normal.  BS equal and clear bilateral without rales, rhonci, wheezing or stridor. Cardio: Heart sounds are normal with regular rate and rhythm and no murmurs, rubs or gallops. Peripheral pulses are normal and equal bilaterally without edema. No aortic or femoral bruits. Chest: symmetric with normal excursions and percussion.  Abdomen: Soft, with Nl bowel sounds. Nontender, no guarding, rebound, hernias, masses, or organomegaly.  Lymphatics: Non tender without lymphadenopathy.  Genitourinary: No hernias.Testes nl. DRE - prostate nl for age - smooth & firm w/o nodules. Musculoskeletal: Full ROM all peripheral  extremities, joint stability, 5/5 strength, and normal gait. Skin: Warm and dry without rashes, lesions, cyanosis, clubbing or  ecchymosis.  Neuro: Cranial nerves intact, reflexes equal bilaterally. Normal muscle tone, no cerebellar symptoms. Sensation intact.  Pysch: Alert and oriented X 3 with normal affect, insight and judgment appropriate.   Assessment and Plan  1. Annual Preventative/Screening Exam    2. Essential hypertension  - EKG 12-Lead - [place], RETROPERITNL ABD,  LTD - Urinalysis, Routine w reflex microscopic - Microalbumin / creatinine urine ratio - CBC with Differential/Platelet - BASIC METABOLIC PANEL WITH GFR - Magnesium - TSH  3. Mixed hyperlipidemia  - EKG 12-Lead - [place], RETROPERITNL ABD,  LTD - Hepatic function panel - Lipid panel - TSH  4. Prediabetes  - EKG 12-Lead - [place], RETROPERITNL ABD,  LTD - Hemoglobin A1c - Insulin, random  5. Vitamin D deficiency  - VITAMIN D 25 Hydroxy   6. OSA on CPAP   7. Screening for rectal  cancer  - POC Hemoccult Bld/Stl   8. Prostate cancer screening  - PSA  9. Testosterone deficiency  - Testosterone  10. Screening for ischemic heart disease  - EKG 12-Lead  11. Screening for AAA (aortic abdominal aneurysm)  - [place], RETROPERITNL ABD,  LTD  12. Fatigue, unspecified type  - Vitamin B12 - Iron and TIBC - CBC with Differential/Platelet - TSH - Testosterone  13. Medication management  - Urinalysis, Routine w reflex microscopic - CBC with Differential/Platelet - BASIC METABOLIC PANEL WITH GFR - Hepatic function panel - Magnesium - Lipid panel - TSH - Hemoglobin A1c - Insulin, random - VITAMIN D 25 Hydroxy        Patient was counseled in prudent diet, weight contro to achieve/maintain BMI less than 25, BP monitoring, regular exercise and medications as discussed.  Discussed med effects and SE's. Routine screening labs and tests as requested with regular follow-up as recommended. Over 40 minutes of exam, counseling, chart review and high complex critical decision making was performed

## 2016-07-30 NOTE — Patient Instructions (Signed)

## 2016-07-31 LAB — BASIC METABOLIC PANEL WITH GFR
BUN: 24 mg/dL (ref 7–25)
CHLORIDE: 103 mmol/L (ref 98–110)
CO2: 23 mmol/L (ref 20–31)
Calcium: 9.3 mg/dL (ref 8.6–10.3)
Creat: 1.08 mg/dL (ref 0.70–1.33)
GFR, EST NON AFRICAN AMERICAN: 77 mL/min (ref >=60)
GFR, Est African American: 89 mL/min (ref >=60)
GLUCOSE: 124 mg/dL — AB (ref 65–99)
POTASSIUM: 3.8 mmol/L (ref 3.5–5.3)
Sodium: 139 mmol/L (ref 135–146)

## 2016-07-31 LAB — INSULIN, RANDOM: INSULIN: 152.1 u[IU]/mL — AB (ref 2.0–19.6)

## 2016-07-31 LAB — HEPATIC FUNCTION PANEL
ALBUMIN: 4.3 g/dL (ref 3.6–5.1)
ALK PHOS: 56 U/L (ref 40–115)
ALT: 51 U/L — ABNORMAL HIGH (ref 9–46)
AST: 33 U/L (ref 10–35)
BILIRUBIN INDIRECT: 0.3 mg/dL (ref 0.2–1.2)
Bilirubin, Direct: 0.1 mg/dL (ref <=0.2)
TOTAL PROTEIN: 6.6 g/dL (ref 6.1–8.1)
Total Bilirubin: 0.4 mg/dL (ref 0.2–1.2)

## 2016-07-31 LAB — URINALYSIS, ROUTINE W REFLEX MICROSCOPIC
BILIRUBIN URINE: NEGATIVE
GLUCOSE, UA: NEGATIVE
Hgb urine dipstick: NEGATIVE
Ketones, ur: NEGATIVE
Leukocytes, UA: NEGATIVE
NITRITE: NEGATIVE
Protein, ur: NEGATIVE
SPECIFIC GRAVITY, URINE: 1.025 (ref 1.001–1.035)
pH: 5.5 (ref 5.0–8.0)

## 2016-07-31 LAB — VITAMIN D 25 HYDROXY (VIT D DEFICIENCY, FRACTURES): Vit D, 25-Hydroxy: 45 ng/mL (ref 30–100)

## 2016-07-31 LAB — LIPID PANEL
Cholesterol: 165 mg/dL (ref <200)
HDL: 39 mg/dL — AB (ref >40)
LDL CALC: 65 mg/dL (ref <100)
TRIGLYCERIDES: 303 mg/dL — AB (ref <150)
Total CHOL/HDL Ratio: 4.2 Ratio (ref <5.0)
VLDL: 61 mg/dL — AB (ref <30)

## 2016-07-31 LAB — HEMOGLOBIN A1C
Hgb A1c MFr Bld: 5.6 % (ref <5.7)
Mean Plasma Glucose: 114 mg/dL

## 2016-07-31 LAB — MAGNESIUM: Magnesium: 1.9 mg/dL (ref 1.5–2.5)

## 2016-07-31 LAB — IRON AND TIBC
%SAT: 28 % (ref 15–60)
IRON: 89 ug/dL (ref 50–180)
TIBC: 319 ug/dL (ref 250–425)
UIBC: 230 ug/dL (ref 125–400)

## 2016-07-31 LAB — VITAMIN B12: VITAMIN B 12: 407 pg/mL (ref 200–1100)

## 2016-07-31 LAB — PSA: PSA: 0.7 ng/mL (ref <=4.0)

## 2016-07-31 LAB — MICROALBUMIN / CREATININE URINE RATIO
Creatinine, Urine: 282 mg/dL (ref 20–370)
Microalb Creat Ratio: 4 mcg/mg creat (ref <30)
Microalb, Ur: 1.2 mg/dL

## 2016-07-31 LAB — TESTOSTERONE: Testosterone: 318 ng/dL (ref 250–827)

## 2016-07-31 LAB — TSH: TSH: 1.41 m[IU]/L (ref 0.40–4.50)

## 2016-08-19 ENCOUNTER — Telehealth: Payer: Self-pay | Admitting: *Deleted

## 2016-08-19 MED ORDER — AZITHROMYCIN 250 MG PO TABS
ORAL_TABLET | ORAL | 0 refills | Status: AC
Start: 2016-08-19 — End: 2016-08-24

## 2016-08-19 NOTE — Telephone Encounter (Signed)
Patient called and states he has sinus drainage with sore throat, ear congestion and productive cough.  Per Dr Melford Aase, add OTC phenylephrine qnd RX for Z-pak sent to the pharmacy.

## 2016-08-21 ENCOUNTER — Ambulatory Visit (INDEPENDENT_AMBULATORY_CARE_PROVIDER_SITE_OTHER): Payer: 59 | Admitting: Physician Assistant

## 2016-08-21 ENCOUNTER — Encounter: Payer: Self-pay | Admitting: Physician Assistant

## 2016-08-21 VITALS — BP 126/84 | HR 97 | Temp 97.9°F | Resp 16 | Ht 69.0 in | Wt 247.0 lb

## 2016-08-21 DIAGNOSIS — B349 Viral infection, unspecified: Secondary | ICD-10-CM

## 2016-08-21 MED ORDER — PROMETHAZINE-DM 6.25-15 MG/5ML PO SYRP: 5.0000 mL | ORAL_SOLUTION | Freq: Four times a day (QID) | ORAL | 1 refills | Status: DC | PRN

## 2016-08-21 MED ORDER — PREDNISONE 20 MG PO TABS: ORAL_TABLET | ORAL | 0 refills | Status: AC

## 2016-08-21 MED ORDER — ALBUTEROL SULFATE HFA 108 (90 BASE) MCG/ACT IN AERS: 2.0000 | INHALATION_SPRAY | RESPIRATORY_TRACT | 0 refills | Status: DC | PRN

## 2016-08-21 NOTE — Patient Instructions (Signed)

## 2016-08-21 NOTE — Progress Notes (Signed)
Cough  Associated symptoms include ear pain, headaches and a sore throat. Pertinent negatives include no chest pain, chills, fever, shortness of breath or wheezing.    Patient presents to the office for evaluation of cough.  It has been going on for 4 days.  Patient reports minimal coughing thus far.  They also endorse change in voice, postnasal drip and nasal congestion, ear pain, and mild upset stomach.    They have tried flonase, astelin, zyrtec  They report that nothing has worked.  They denies other sick contacts. Called up here and got zpak Monday.   Review of Systems  Constitutional: Positive for malaise/fatigue. Negative for chills and fever.  HENT: Positive for congestion, ear pain, hearing loss and sore throat.   Respiratory: Positive for cough. Negative for sputum production, shortness of breath and wheezing.   Cardiovascular: Negative for chest pain, palpitations and leg swelling.  Neurological: Positive for headaches.    PE:  Vitals:   08/21/16 0909  BP: 126/84  Pulse: 97  Resp: 16  Temp: 97.9 F (36.6 C)    General:  Alert and non-toxic, WDWN, NAD HEENT: NCAT, PERLA, EOM normal, no occular discharge or erythema.  Nasal mucosal edema with sinus tenderness to palpation.  Oropharynx clear with minimal oropharyngeal edema and erythema.  Mucous membranes moist and pink. Neck:  Cervical adenopathy Chest:  RRR no MRGs.  Lungs clear to auscultation A&P with no wheezes rhonchi or rales.   Abdomen: +BS x 4 quadrants, soft, non-tender, no guarding, rigidity, or rebound. Skin: warm and dry no rash Neuro: A&Ox4, CN II-XII grossly intact  Assessment and Plan:   1. Viral syndrome -likely virus vs. Allergic rhinitis -dizziness due to middle ear effusion and patient has had good relief from meclizine in the past for this -elevate head during sleep -daily antihistamine tablet -no need for abx -recommended patient quit smoking which he reports that he is not ready to do. -  fluticasone (FLONASE) 50 MCG/ACT nasal spray; Place 2 sprays into both nostrils at bedtime.  Dispense: 16 g; Refill: 3 - predniSONE (DELTASONE) 20 MG tablet; 3 tabs po daily x 3 days, then 2 tabs x 3 days, then 1.5 tabs x 3 days, then 1 tab x 3 days, then 0.5 tabs x 3 days  Dispense: 27 tablet; Refill: 0 - promethazine-phenylephrine (PROMETHAZINE VC) 6.25-5 MG/5ML SYRP; Take 5 mLs by mouth every 6 (six) hours as needed for congestion.  Dispense: 280 mL; Refill: 0

## 2016-09-12 ENCOUNTER — Other Ambulatory Visit: Payer: Self-pay | Admitting: Physician Assistant

## 2016-09-12 MED ORDER — PREDNISONE 20 MG PO TABS
ORAL_TABLET | ORAL | 0 refills | Status: DC
Start: 2016-09-12 — End: 2016-12-09

## 2016-09-12 MED ORDER — AZITHROMYCIN 250 MG PO TABS: ORAL_TABLET | ORAL | 1 refills | Status: AC

## 2016-09-13 ENCOUNTER — Other Ambulatory Visit: Payer: Self-pay

## 2016-09-13 DIAGNOSIS — Z1212 Encounter for screening for malignant neoplasm of rectum: Secondary | ICD-10-CM

## 2016-09-13 LAB — POC HEMOCCULT BLD/STL (HOME/3-CARD/SCREEN)
Card #3 Fecal Occult Blood, POC: NEGATIVE
FECAL OCCULT BLD: NEGATIVE
Fecal Occult Blood, POC: NEGATIVE

## 2016-10-01 DIAGNOSIS — M545 Low back pain: Secondary | ICD-10-CM | POA: Diagnosis not present

## 2016-10-01 DIAGNOSIS — M546 Pain in thoracic spine: Secondary | ICD-10-CM | POA: Diagnosis not present

## 2016-10-01 DIAGNOSIS — M9903 Segmental and somatic dysfunction of lumbar region: Secondary | ICD-10-CM | POA: Diagnosis not present

## 2016-10-01 DIAGNOSIS — M9902 Segmental and somatic dysfunction of thoracic region: Secondary | ICD-10-CM | POA: Diagnosis not present

## 2016-11-06 ENCOUNTER — Ambulatory Visit: Payer: Self-pay | Admitting: Physician Assistant

## 2016-11-06 DIAGNOSIS — M9903 Segmental and somatic dysfunction of lumbar region: Secondary | ICD-10-CM | POA: Diagnosis not present

## 2016-11-06 DIAGNOSIS — M9902 Segmental and somatic dysfunction of thoracic region: Secondary | ICD-10-CM | POA: Diagnosis not present

## 2016-11-06 DIAGNOSIS — M546 Pain in thoracic spine: Secondary | ICD-10-CM | POA: Diagnosis not present

## 2016-11-06 DIAGNOSIS — H5203 Hypermetropia, bilateral: Secondary | ICD-10-CM | POA: Diagnosis not present

## 2016-11-06 DIAGNOSIS — M545 Low back pain: Secondary | ICD-10-CM | POA: Diagnosis not present

## 2016-11-06 DIAGNOSIS — H52223 Regular astigmatism, bilateral: Secondary | ICD-10-CM | POA: Diagnosis not present

## 2016-11-06 DIAGNOSIS — H524 Presbyopia: Secondary | ICD-10-CM | POA: Diagnosis not present

## 2016-11-09 DIAGNOSIS — B9689 Other specified bacterial agents as the cause of diseases classified elsewhere: Secondary | ICD-10-CM | POA: Diagnosis not present

## 2016-11-09 DIAGNOSIS — J069 Acute upper respiratory infection, unspecified: Secondary | ICD-10-CM | POA: Diagnosis not present

## 2016-11-21 ENCOUNTER — Encounter: Payer: Self-pay | Admitting: Internal Medicine

## 2016-12-07 NOTE — Progress Notes (Signed)
Assessment and Plan:   Hypertension -Continue medication, monitor blood pressure at home. Continue DASH diet.  Reminder to go to the ER if any CP, SOB, nausea, dizziness, severe HA, changes vision/speech, left arm numbness and tingling and jaw pain.  Cholesterol -Continue diet and exercise. Check cholesterol. - can cut 1/2 and do every other day.     Prediabetes  -Continue diet and exercise. Check A1C  Vitamin D Def - check level and continue medications.   Obesity with co morbidities-  long discussion about weight loss, diet, and exercise  Rash  Likely heat rash Triamcinolone, call if not better  Continue diet and meds as discussed. Further disposition pending results of labs. Over 30 minutes of exam, counseling, chart review, and critical decision making was performed  HPI 56 y.o. male  presents for 3 month follow up on hypertension, cholesterol, prediabetes, and vitamin D deficiency.   His blood pressure has been controlled at home, today their BP is BP: 130/86  He does not workout but has been working out side. He denies chest pain, shortness of breath, dizziness.  Has rash since the beach, does not itch, bilateral legs and arms, improving slowly.   He is on cholesterol medication, lipitor 80mg  1/2 pill every other day and denies myalgias. His cholesterol is at goal. The cholesterol last visit was:   Lab Results  Component Value Date   CHOL 165 07/30/2016   HDL 39 (L) 07/30/2016   LDLCALC 65 07/30/2016   TRIG 303 (H) 07/30/2016   CHOLHDL 4.2 07/30/2016    He has been working on diet and exercise for prediabetes, has been elevated in the past, and denies paresthesia of the feet, polydipsia, polyuria and visual disturbances. Last A1C in the office was:  Lab Results  Component Value Date   HGBA1C 5.6 07/30/2016   Patient is on Vitamin D supplement.   Lab Results  Component Value Date   VD25OH 45 07/30/2016   BMI is Body mass index is 36.18 kg/m., he is working on  diet and exercise, + OSA and is on CPAP. Has quit smoking with e cigs.  Wt Readings from Last 3 Encounters:  12/09/16 245 lb (111.1 kg)  08/21/16 247 lb (112 kg)  07/30/16 241 lb 9.6 oz (109.6 kg)      Current Medications:  Current Outpatient Prescriptions on File Prior to Visit  Medication Sig Dispense Refill  . albuterol (VENTOLIN HFA) 108 (90 Base) MCG/ACT inhaler Inhale 2 puffs into the lungs every 4 (four) hours as needed for wheezing or shortness of breath. 1 Inhaler 0  . Ascorbic Acid (VITAMIN C PO) Take by mouth daily.    Marland Kitchen aspirin 81 MG chewable tablet Chew 81 mg by mouth daily.    Marland Kitchen atorvastatin (LIPITOR) 80 MG tablet TAKE 1/2 TO 1 TABLET BY MOUTH EACH DAY AS DIRECTED FOR CHOLESTEROL 90 tablet 1  . azelastine (ASTELIN) 0.1 % nasal spray Place 2 sprays into both nostrils 2 (two) times daily. Use in each nostril as directed 30 mL 2  . Cholecalciferol (VITAMIN D PO) Take 8,000 Int'l Units by mouth daily.    . Cyanocobalamin (VITAMIN B 12 PO) Take by mouth daily.    . fluticasone (FLONASE) 50 MCG/ACT nasal spray Place 2 sprays into both nostrils at bedtime. 16 g 3  . levocetirizine (XYZAL) 5 MG tablet Take 5 mg by mouth every evening.    . meclizine (ANTIVERT) 25 MG tablet Take 1 tablet (25 mg total) by mouth 3 (  three) times daily as needed for dizziness. 90 tablet 0  . Multiple Vitamins-Minerals (MENS MULTIVITAMIN PLUS) TABS Take by mouth daily.    . Omega-3 Fatty Acids (FISH OIL PO) Take by mouth 3 (three) times daily.     Marland Kitchen omeprazole (PRILOSEC) 40 MG capsule Take 1 capsule (40 mg total) by mouth daily. 30 capsule 1  . ranitidine (ZANTAC) 300 MG tablet Take 1 tablet (300 mg total) by mouth at bedtime. 30 tablet 1   No current facility-administered medications on file prior to visit.    Medical History:  Patient Active Problem List   Diagnosis Date Noted  . Right maxillary sinusitis, chronic 03/13/2015  . BMI 34.0-34.9,adult 03/13/2015  . OSA / Intolerant to CPAP 08/18/2014   . Obesity 02/15/2014  . Tobacco use disorder 02/15/2014  . Medication management 10/08/2013  . Prediabetes 05/18/2013  . Essential hypertension 05/18/2013  . Hyperlipidemia   . Vitamin D deficiency    Allergies:  Allergies  Allergen Reactions  . Crestor [Rosuvastatin]      Review of Systems:  Review of Systems  Constitutional: Negative for chills, diaphoresis, fever, malaise/fatigue and weight loss.  HENT: Negative for congestion, ear discharge, ear pain, hearing loss, nosebleeds, sore throat and tinnitus.   Eyes: Negative.   Respiratory: Negative.  Negative for stridor.   Cardiovascular: Negative.   Gastrointestinal: Negative for abdominal pain, blood in stool, constipation, diarrhea, heartburn, melena, nausea and vomiting.  Genitourinary: Negative.   Musculoskeletal: Negative for back pain, falls, joint pain, myalgias and neck pain.  Skin: Positive for rash. Negative for itching.  Neurological: Negative.  Negative for weakness and headaches.  Endo/Heme/Allergies: Negative.   Psychiatric/Behavioral: Negative.     Family history- Review and unchanged Social history- Review and unchanged Physical Exam: BP 130/86   Pulse 83   Temp (!) 97.5 F (36.4 C)   Resp 18   Ht 5\' 9"  (1.753 m)   Wt 245 lb (111.1 kg)   SpO2 98%   BMI 36.18 kg/m  Wt Readings from Last 3 Encounters:  12/09/16 245 lb (111.1 kg)  08/21/16 247 lb (112 kg)  07/30/16 241 lb 9.6 oz (109.6 kg)   General Appearance: Well nourished, in no apparent distress. Eyes: PERRLA, EOMs, conjunctiva no swelling or erythema Sinuses: No Frontal/maxillary tenderness ENT/Mouth: Ext aud canals clear, TMs without erythema, bulging. No erythema, swelling, or exudate on post pharynx.  Tonsils not swollen or erythematous. Hearing normal.  Neck: Supple, thyroid normal.  Respiratory: Respiratory effort normal, BS equal bilaterally without rales, rhonchi, wheezing or stridor.  Cardio: RRR with no MRGs. Brisk peripheral pulses  without edema.  Abdomen: Soft, + BS,  Non tender, no guarding, rebound, hernias, masses. Lymphatics: Non tender without lymphadenopathy.  Musculoskeletal: Full ROM, 5/5 strength, Normal gait Skin: bilateral legs and arms with erythematous papules. Warm, dry without rashes, lesions, ecchymosis.  Neuro: Cranial nerves intact. Normal muscle tone, no cerebellar symptoms. Psych: Awake and oriented X 3, normal affect, Insight and Judgment appropriate.    Vicie Mutters, PA-C 4:36 PM [facility]

## 2016-12-09 ENCOUNTER — Ambulatory Visit (INDEPENDENT_AMBULATORY_CARE_PROVIDER_SITE_OTHER): Payer: 59 | Admitting: Physician Assistant

## 2016-12-09 ENCOUNTER — Encounter: Payer: Self-pay | Admitting: Physician Assistant

## 2016-12-09 VITALS — BP 130/86 | HR 83 | Temp 97.5°F | Resp 18 | Ht 69.0 in | Wt 245.0 lb

## 2016-12-09 DIAGNOSIS — Z79899 Other long term (current) drug therapy: Secondary | ICD-10-CM | POA: Diagnosis not present

## 2016-12-09 DIAGNOSIS — I10 Essential (primary) hypertension: Secondary | ICD-10-CM | POA: Diagnosis not present

## 2016-12-09 DIAGNOSIS — F17200 Nicotine dependence, unspecified, uncomplicated: Secondary | ICD-10-CM | POA: Diagnosis not present

## 2016-12-09 DIAGNOSIS — E782 Mixed hyperlipidemia: Secondary | ICD-10-CM

## 2016-12-09 DIAGNOSIS — R21 Rash and other nonspecific skin eruption: Secondary | ICD-10-CM | POA: Diagnosis not present

## 2016-12-09 DIAGNOSIS — R7303 Prediabetes: Secondary | ICD-10-CM | POA: Diagnosis not present

## 2016-12-09 MED ORDER — TRIAMCINOLONE ACETONIDE 0.5 % EX CREA: 1.0000 "application " | TOPICAL_CREAM | Freq: Two times a day (BID) | CUTANEOUS | 2 refills | Status: DC

## 2016-12-09 NOTE — Patient Instructions (Addendum)
Get on flonase nightly and take xyzal daily to try to prevent sinus infection   Heat Rash, Adult Heat rash is an itchy rash of little red bumps that often occurs during hot, humid weather. Heat rash is also called prickly heat or miliaria. Heat rash usually affects:  Armpits.  Elbows.  Groin.  Neck.  The area underneath the breasts.  Shoulders.  Chest.  What are the causes? This condition is caused by blocked sweat ducts. When sweat is trapped under the skin, it spreads into surrounding tissues and causes a rash of red bumps. What increases the risk? This condition is more likely to develop in people who:  Are overdressed in hot, humid weather.  Wear clothing that rubs against the skin.  Are active in hot, humid weather.  Sweat a lot.  Are not used to hot, humid weather.  What are the signs or symptoms? Symptoms of this condition include:  Small red bumps that are itchy or prickly.  Very little sweating or no sweating in the affected area.  How is this diagnosed? This condition is diagnosed based on your symptoms and medical history, as well as a physical exam. How is this treated? Moving to a cool, dry place is the best treatment for heat rash. Treatment may also include medicines, such as:  Corticosteroid creams for skin irritation.  Antibiotic medicines, if the rash becomes infected.  Follow these instructions at home: Skin care  Keep the affected area dry.  Do not apply ointments or creams that contain mineral oil or petroleum ingredients to your skin. These can make the condition worse.  Apply cool compresses to the affected areas.  Do not scratch your skin.  Do not take hot showers or baths. General instructions  Take over-the-counter and prescription medicines only as told by your health care provider.  If you were prescribed an antibiotic, take it as told by your health care provider. Do not stop taking it even if your condition  improves.  Stay in a cool room as much as possible. Use an air conditioner or fan, if possible.  Do not wear tight clothes. Wear comfortable, loose-fitting clothing.  Keep all follow-up visits as told by your health care provider. This is important. Contact a health care provider if:  You have a fever.  Your rash does not go away after 3-4 days.  Your rash gets worse or it is very itchy.  Your rash has pus or fluid coming from it. Get help right away if:  You are dizzy or nauseated.  You feel confused.  You have trouble breathing.  You have chest pain.  You have muscle cramps or contractions.  You faint. Summary  Heat rash is an itchy rash of little red bumps that often occurs during hot, humid weather.  Symptoms of heat rash include small red bumps that are itchy or prickly and very little or no sweating in the affected area.  This condition is diagnosed based on your symptoms and medical history, as well as a physical exam.  Moving to a cool, dry place is the best treatment for heat rash.  Do not wear tight clothes. Wear comfortable, loose-fitting clothing. This information is not intended to replace advice given to you by your health care provider. Make sure you discuss any questions you have with your health care provider. Document Released: 03/27/2009 Document Revised: 06/19/2016 Document Reviewed: 06/19/2016 Elsevier Interactive Patient Education  2018 [employer]   Your ears and sinuses are connected by  the eustachian tube. When your sinuses are inflamed, this can close off the tube and cause fluid to collect in your middle ear. This can then cause dizziness, popping, clicking, ringing, and echoing in your ears. This is often NOT an infection and does NOT require antibiotics, it is caused by inflammation so the treatments help the inflammation. This can take a long time to get better so please be patient.  Here are things you can do to help with this: - Try  the Flonase or Nasonex. Remember to spray each nostril twice towards the outer part of your eye.  Do not sniff but instead pinch your nose and tilt your head back to help the medicine get into your sinuses.  The best time to do this is at bedtime.Stop if you get blurred vision or nose bleeds.  -While drinking fluids, pinch and hold nose close and swallow, to help open eustachian tubes to drain fluid behind ear drums. -Please pick one of the over the counter allergy medications below and take it once daily for allergies.  It will also help with fluid behind ear drums. Claritin or loratadine cheapest but likely the weakest  Zyrtec or certizine at night because it can make you sleepy The strongest is allegra or fexafinadine  Cheapest at [employer], [employer], [employer] -can use decongestant over the counter, please do not use if you have high blood pressure or certain heart conditions.   if worsening HA, changes vision/speech, imbalance, weakness go to the ER    Simple math prevails.    1st - exercise does not produce significant weight loss - at best one converts fat into muscle , "bulks up", loses inches, but usually stays "weight neutral"     2nd - think of your body weightas a check book: If you eat more calories than you burn up - you save money or gain weight .... Or if you spend more money than you put in the check book, ie burn up more calories than you eat, then you lose weight     3rd - if you walk or run 1 mile, you burn up 100 calories - you have to burn up 3,500 calories to lose 1 pound, ie you have to walk/run 35 miles to lose 1 measly pound. So if you want to lose 10 #, then you have to walk/run 350 miles, so.... clearly exercise is not the solution.     4. So if you consume 1,500 calories, then you have to burn up the equivalent of 15 miles to stay weight neutral - It also stands to reason that if you consume 1,500 cal/day and don't lose weight, then you must be burning up about 1,500  cals/day to stay weight neutral.     5. If you really want to lose weight, you must cut your calorie intake 300 calories /day and at that rate you should lose about 1 # every 3 days.   6. Please purchase Dr Fara Olden Fuhrman's book(s) "The End of Dieting" & "Eat to Live" . It has some great concepts and recipes.      Drink 80-100 oz a day of water, measure it out Eat 3 meals a day, have to do breakfast, eat protein- hard boiled eggs, protein bar like [place] protein bar, greek yogurt like oikos triple zero, chobani 100, or light n fit greek  We want weight loss that will last so you should lose 1-2 pounds a week.  THAT IS IT! Please pick THREE  things a month to change. Once it is a habit check off the item. Then pick another three items off the list to become habits.  If you are already doing a habit on the list GREAT!  Cross that item off! o Don't drink your calories. Ie, alcohol, soda, fruit juice, and sweet tea.  o Drink more water. Drink a glass when you feel hungry or before each meal.  o Eat breakfast - Complex carb and protein (likeDannon light and fit yogurt, oatmeal, fruit, eggs, [place] bacon). o Measure your cereal.  Eat no more than one cup a day. (ie [place]) o Eat an apple a day. o Add a vegetable a day. o Try a new vegetable a month. o Use Pam! Stop using oil or butter to cook. o Don't finish your plate or use smaller plates. o Share your dessert. o Eat sugar free Jello for dessert or frozen grapes. o Don't eat 2-3 hours before bed. o Switch to whole wheat bread, pasta, and brown rice. o Make healthier choices when you eat out. No fries! o Pick baked chicken, NOT fried. o Don't forget to SLOW DOWN when you eat. It is not going anywhere.  o Take the stairs. o Park far away in the parking lot o [employer] (or weights) for 10 minutes while watching TV. o Walk at work for 10 minutes during break. o Walk outside 1 time a week with your friend, kids, dog, or significant  other. o Start a walking group at [place] the mall as much as you can tolerate.  o Keep a food diary. o Weigh yourself daily. o Walk for 15 minutes 3 days per week. o Cook at home more often and eat out less.  If life happens and you go back to old habits, it is okay.  Just start over. You can do it!   If you experience chest pain, get short of breath, or tired during the exercise, please stop immediately and inform your doctor.

## 2016-12-10 LAB — CBC WITH DIFFERENTIAL/PLATELET
BASOS PCT: 0.9 %
Basophils Absolute: 98 cells/uL (ref 0–200)
EOS ABS: 240 {cells}/uL (ref 15–500)
Eosinophils Relative: 2.2 %
HEMATOCRIT: 47.4 % (ref 38.5–50.0)
HEMOGLOBIN: 16 g/dL (ref 13.2–17.1)
Lymphs Abs: 2671 cells/uL (ref 850–3900)
MCH: 31.1 pg (ref 27.0–33.0)
MCHC: 33.8 g/dL (ref 32.0–36.0)
MCV: 92.2 fL (ref 80.0–100.0)
MPV: 11 fL (ref 7.5–12.5)
Monocytes Relative: 7.4 %
NEUTROS ABS: 7085 {cells}/uL (ref 1500–7800)
Neutrophils Relative %: 65 %
Platelets: 219 10*3/uL (ref 140–400)
RBC: 5.14 10*6/uL (ref 4.20–5.80)
RDW: 12.6 % (ref 11.0–15.0)
TOTAL LYMPHOCYTE: 24.5 %
WBC mixed population: 807 cells/uL (ref 200–950)
WBC: 10.9 10*3/uL — AB (ref 3.8–10.8)

## 2016-12-10 LAB — HEPATIC FUNCTION PANEL
AG Ratio: 1.9 (calc) (ref 1.0–2.5)
ALKALINE PHOSPHATASE (APISO): 55 U/L (ref 40–115)
ALT: 49 U/L — AB (ref 9–46)
AST: 36 U/L — ABNORMAL HIGH (ref 10–35)
Albumin: 4.3 g/dL (ref 3.6–5.1)
BILIRUBIN INDIRECT: 0.4 mg/dL (ref 0.2–1.2)
Bilirubin, Direct: 0.1 mg/dL (ref 0.0–0.2)
GLOBULIN: 2.3 g/dL (ref 1.9–3.7)
TOTAL PROTEIN: 6.6 g/dL (ref 6.1–8.1)
Total Bilirubin: 0.5 mg/dL (ref 0.2–1.2)

## 2016-12-10 LAB — BASIC METABOLIC PANEL WITH GFR
BUN: 19 mg/dL (ref 7–25)
CALCIUM: 9.5 mg/dL (ref 8.6–10.3)
CHLORIDE: 106 mmol/L (ref 98–110)
CO2: 28 mmol/L (ref 20–32)
Creat: 0.97 mg/dL (ref 0.70–1.33)
GFR, EST NON AFRICAN AMERICAN: 88 mL/min/{1.73_m2} (ref >=60)
GFR, Est African American: 101 mL/min/{1.73_m2} (ref >=60)
Glucose, Bld: 110 mg/dL — ABNORMAL HIGH (ref 65–99)
Potassium: 3.6 mmol/L (ref 3.5–5.3)
Sodium: 141 mmol/L (ref 135–146)

## 2016-12-10 LAB — LIPID PANEL
CHOLESTEROL: 159 mg/dL (ref <200)
HDL: 31 mg/dL — AB (ref >40)
LDL Cholesterol (Calc): 90 mg/dL (calc)
Non-HDL Cholesterol (Calc): 128 mg/dL (calc) (ref <130)
TRIGLYCERIDES: 316 mg/dL — AB (ref <150)
Total CHOL/HDL Ratio: 5.1 (calc) — ABNORMAL HIGH (ref <5.0)

## 2016-12-10 LAB — HEMOGLOBIN A1C
EAG (MMOL/L): 6.2 (calc)
Hgb A1c MFr Bld: 5.5 % of total Hgb (ref <5.7)
Mean Plasma Glucose: 111 (calc)

## 2016-12-10 LAB — MAGNESIUM: MAGNESIUM: 2 mg/dL (ref 1.5–2.5)

## 2016-12-10 LAB — TSH: TSH: 1.4 mIU/L (ref 0.40–4.50)

## 2016-12-10 NOTE — Progress Notes (Signed)
LVM for pt to return office call for LAB results.

## 2016-12-18 NOTE — Progress Notes (Signed)
Pt aware of lab results & voiced understanding of those results.

## 2017-02-10 ENCOUNTER — Ambulatory Visit (INDEPENDENT_AMBULATORY_CARE_PROVIDER_SITE_OTHER): Payer: 59 | Admitting: Internal Medicine

## 2017-02-10 ENCOUNTER — Encounter: Payer: Self-pay | Admitting: Internal Medicine

## 2017-02-10 VITALS — BP 122/66 | HR 76 | Temp 97.3°F | Resp 18 | Ht 69.0 in | Wt 243.3 lb

## 2017-02-10 DIAGNOSIS — J0140 Acute pansinusitis, unspecified: Secondary | ICD-10-CM | POA: Diagnosis not present

## 2017-02-10 DIAGNOSIS — J029 Acute pharyngitis, unspecified: Secondary | ICD-10-CM

## 2017-02-10 DIAGNOSIS — J0410 Acute tracheitis without obstruction: Secondary | ICD-10-CM | POA: Diagnosis not present

## 2017-02-10 MED ORDER — LEVOFLOXACIN 500 MG PO TABS: ORAL_TABLET | ORAL | 0 refills | Status: DC

## 2017-02-10 MED ORDER — PREDNISONE 20 MG PO TABS: ORAL_TABLET | ORAL | 0 refills | Status: DC

## 2017-02-10 NOTE — Progress Notes (Signed)
  Subjective:    Patient ID: Javier Vega, male    DOB: March 05, 1961, 56 y.o.   MRN: [id number]  HPI  This nice 56 yo MWM presents with 1 week prodrome of increasing HA, Sinus pressure/conestion/pain, S/T  And dry cough. Reports low grade intermittent fever . Denies rash, dyspnea or sputum.  Medication Sig  . albuterol (VENTOLIN HFA) 108 (90 Base) MCG/ACT inhaler Inhale 2 puffs into the lungs every 4 (four) hours as needed for wheezing or shortness of breath.  . Ascorbic Acid (VITAMIN C PO) Take by mouth daily.  Marland Kitchen aspirin 81 MG chewable tablet Chew 81 mg by mouth daily.  Marland Kitchen atorvastatin (LIPITOR) 80 MG tablet TAKE 1/2 TO 1 TABLET BY MOUTH EACH DAY AS DIRECTED FOR CHOLESTEROL  . Cholecalciferol (VITAMIN D PO) Take 8,000 Int'l Units by mouth daily.  . Cyanocobalamin (VITAMIN B 12 PO) Take by mouth daily.  . fluticasone (FLONASE) 50 MCG/ACT nasal spray Place 2 sprays into both nostrils at bedtime.  Marland Kitchen levocetirizine (XYZAL) 5 MG tablet Take 5 mg by mouth every evening.  . meclizine (ANTIVERT) 25 MG tablet Take 1 tablet (25 mg total) by mouth 3 (three) times daily as needed for dizziness.  . Multiple Vitamins-Minerals (MENS MULTIVITAMIN PLUS) TABS Take by mouth daily.  . Omega-3 Fatty Acids (FISH OIL PO) Take by mouth 3 (three) times daily.   Marland Kitchen triamcinolone cream (KENALOG) 0.5 % Apply 1 application topically 2 (two) times daily.  Marland Kitchen azelastine (ASTELIN) 0.1 % nasal spray Place 2 sprays into both nostrils 2 (two) times daily. Use in each nostril as directed  . omeprazole (PRILOSEC) 40 MG capsule Take 1 capsule (40 mg total) by mouth daily.  . ranitidine (ZANTAC) 300 MG tablet Take 1 tablet (300 mg total) by mouth at bedtime.   Allergies  Allergen Reactions  . Crestor [Rosuvastatin]    Past Medical History:  Diagnosis Date  . Allergy    SEASONAL  . Anal fissure   . Arthritis   . Hyperlipidemia   . Hypertension   . Sleep apnea   . Vitamin D deficiency    Review of Systems  10 point systems  review negative except as above.    Objective:   Physical Exam  BP 122/66   Pulse 76   Temp (!) 97.3 F (36.3 C)   Resp 18   Ht 5\' 9"  (1.753 m)   Wt 243 lb 4.8 oz (110.4 kg)   BMI 35.93 kg/m   Dry cough. No Stridor. No Rash.   HEENT - Eac's patent. TM's Nl. EOM's full. PERRLA. (+) fronto-maxillary areas. NasoOroPharynx 2(+) red w/o exudate. Neck - supple. Nl Thyroid. Carotids 2+ & No bruits, JVD. (+) tender ant Cx LN's. Chest - + Scattered dry Rales & no rhonchi, wheezes. Cor - Nl HS. RRR w/o sig m.  MS- FROM w/o deformities. Muscle power, tone and bulk Nl. Gait Nl. Neuro - No obvious Cr N abnormalities. Sensory, motor and Cerebellar functions appear Nl w/o focal abnormalities.    Assessment & Plan:   1. Acute non-recurrent pansinusitis   2. Tracheitis   3. Sore throat  - predniSONE (DELTASONE) 20 MG tablet; 1 tab 3 x day for 3 days, then 1 tab 2 x day for 3 days, then 1 tab 1 x day for 5 days  Dispense: 20 tablet; Refill: 0 - levofloxacin (LEVAQUIN) 500 MG tablet; Take 1 tablet daily with food for infection  Dispense: 15 tablet; Refill: 0

## 2017-02-11 ENCOUNTER — Ambulatory Visit: Payer: Self-pay | Admitting: Internal Medicine

## 2017-02-20 DIAGNOSIS — M25561 Pain in right knee: Secondary | ICD-10-CM | POA: Diagnosis not present

## 2017-03-07 DIAGNOSIS — M25561 Pain in right knee: Secondary | ICD-10-CM | POA: Diagnosis not present

## 2017-03-12 ENCOUNTER — Ambulatory Visit (INDEPENDENT_AMBULATORY_CARE_PROVIDER_SITE_OTHER): Payer: 59 | Admitting: Internal Medicine

## 2017-03-12 VITALS — BP 122/84 | HR 92 | Temp 97.5°F | Resp 18 | Ht 69.0 in | Wt 243.4 lb

## 2017-03-12 DIAGNOSIS — E559 Vitamin D deficiency, unspecified: Secondary | ICD-10-CM

## 2017-03-12 DIAGNOSIS — E782 Mixed hyperlipidemia: Secondary | ICD-10-CM | POA: Diagnosis not present

## 2017-03-12 DIAGNOSIS — J0410 Acute tracheitis without obstruction: Secondary | ICD-10-CM | POA: Diagnosis not present

## 2017-03-12 DIAGNOSIS — J0140 Acute pansinusitis, unspecified: Secondary | ICD-10-CM | POA: Diagnosis not present

## 2017-03-12 DIAGNOSIS — I10 Essential (primary) hypertension: Secondary | ICD-10-CM | POA: Diagnosis not present

## 2017-03-12 DIAGNOSIS — Z79899 Other long term (current) drug therapy: Secondary | ICD-10-CM

## 2017-03-12 DIAGNOSIS — R7303 Prediabetes: Secondary | ICD-10-CM

## 2017-03-12 MED ORDER — AZITHROMYCIN 250 MG PO TABS: ORAL_TABLET | ORAL | 1 refills | Status: DC

## 2017-03-12 MED ORDER — PROMETHAZINE-DM 6.25-15 MG/5ML PO SYRP: ORAL_SOLUTION | ORAL | 1 refills | Status: DC

## 2017-03-12 MED ORDER — PREDNISONE 20 MG PO TABS: ORAL_TABLET | ORAL | 0 refills | Status: DC

## 2017-03-12 NOTE — Patient Instructions (Signed)

## 2017-03-12 NOTE — Progress Notes (Signed)
This very nice 56 y.o. MWM presents for 6 month follow up with Hypertension, Hyperlipidemia, Pre-Diabetes and Vitamin D Deficiency. Patient has hx/o OSA, but was Intolerant of the CPAP & mask.      He also preserts with 1-2 week prodrome of head and chest congestion, putrid nasal secretions & sputum, but denies fever, chills dyspnea or rash.      Patient is treated for HTN (2008)  & BP has been controlled at home. Today's BP is at goal - 122/84. Patient has had no complaints of any cardiac type chest pain, palpitations, dyspnea / orthopnea / PND, dizziness, claudication, or dependent edema. He also had recent R knee aspiration and for persisting pain had a MRI of the R knee and hence is scheduled to see Dr Veverly Fells on Dec 6th..      Hyperlipidemia is controlled with diet & meds. Patient denies myalgias or other med SE's. Last Lipids were at goal, albeit elevated Trig's: Lab Results  Component Value Date   CHOL 159 12/09/2016   HDL 31 (L) 12/09/2016   LDLCALC 65 07/30/2016   TRIG 316 (H) 12/09/2016   CHOLHDL 5.1 (H) 12/09/2016      Also, the patient has history of Morbid Obesity (BMI 36) and  PreDiabetes (A1c 5.7% / 2010 and 5.8% / 2016) and he has had no symptoms of reactive hypoglycemia, diabetic polys, paresthesias or visual blurring.  Last A1c was back to normal & at goal: Lab Results  Component Value Date   HGBA1C 5.5 12/09/2016      Further, the patient also has history of Vitamin D Deficiency ("1q7" / 2008) and supplements vitamin D without any suspected side-effects. Last vitamin D was improved:  Lab Results  Component Value Date   VD25OH 45 07/30/2016   Current Outpatient Medications on File Prior to Visit  Medication Sig  . albuterol (VENTOLIN HFA) 108 (90 Base) MCG/ACT inhaler Inhale 2 puffs into the lungs every 4 (four) hours as needed for wheezing or shortness of breath.  . Ascorbic Acid (VITAMIN C PO) Take by mouth daily.  Marland Kitchen aspirin 81 MG chewable tablet Chew 81 mg by  mouth daily.  Marland Kitchen atorvastatin (LIPITOR) 80 MG tablet TAKE 1/2 TO 1 TABLET BY MOUTH EACH DAY AS DIRECTED FOR CHOLESTEROL  . Cholecalciferol (VITAMIN D PO) Take 8,000 Int'l Units by mouth daily.  . fluticasone (FLONASE) 50 MCG/ACT nasal spray Place 2 sprays into both nostrils at bedtime.  Marland Kitchen levocetirizine (XYZAL) 5 MG tablet Take 5 mg by mouth every evening.  . meclizine (ANTIVERT) 25 MG tablet Take 1 tablet (25 mg total) by mouth 3 (three) times daily as needed for dizziness.  . Multiple Vitamins-Minerals (MENS MULTIVITAMIN PLUS) TABS Take by mouth daily.  . Omega-3 Fatty Acids (FISH OIL PO) Take by mouth 3 (three) times daily.    No current facility-administered medications on file prior to visit.    Allergies  Allergen Reactions  . Crestor [Rosuvastatin]    PMHx:   Past Medical History:  Diagnosis Date  . Allergy    SEASONAL  . Anal fissure   . Arthritis   . Hyperlipidemia   . Hypertension   . Sleep apnea   . Vitamin D deficiency    Immunization History  Administered Date(s) Administered  . Influenza-Unspecified 01/08/2017  . MMR 12/21/2005  . Pneumococcal-Unspecified 04/22/2009  . Tdap 12/21/2005   Past Surgical History:  Procedure Laterality Date  . CYSTO  2002   FHx:  Reviewed / unchanged  SHx:    Reviewed / unchanged  Systems Review:  Constitutional: Denies fever, chills, wt changes, headaches, insomnia, fatigue, night sweats, change in appetite. Eyes: Denies redness, blurred vision, diplopia, discharge, itchy, watery eyes.  ENT: Denies discharge, epistaxis, sore throat, earache, hearing loss, dental pain, tinnitus, vertigo,  snoring. Has sinus congestion, post nasal drainage & sinus pain. CV: Denies chest pain, palpitations, irregular heartbeat, syncope, dyspnea, diaphoresis, orthopnea, PND, claudication or edema. Respiratory: denies dyspnea, pleurisy or laryngitis. Has productive cough, hoarseness and occas. wheezing.  Gastrointestinal: Denies dysphagia,  odynophagia, heartburn, reflux, water brash, abdominal pain or cramps, nausea, vomiting, bloating, diarrhea, constipation, hematemesis, melena, hematochezia  or hemorrhoids. Genitourinary: Denies dysuria, frequency, urgency, nocturia, hesitancy, discharge, hematuria or flank pain. Musculoskeletal: Denies arthralgias, myalgias, stiffness, jt. swelling, pain, limping or strain/sprain.  Skin: Denies pruritus, rash, hives, warts, acne, eczema or change in skin lesion(s). Neuro: No weakness, tremor, incoordination, spasms, paresthesia or pain. Psychiatric: Denies confusion, memory loss or sensory loss. Endo: Denies change in weight, skin or hair change.  Heme/Lymph: No excessive bleeding, bruising or enlarged lymph nodes.  Physical Exam  BP 122/84   Pulse 92   Temp (!) 97.5 F (36.4 C)   Resp 18   Ht 5' 9"  (1.753 m)   Wt 243 lb 6.4 oz (110.4 kg)   BMI 35.94 kg/m   Appears well nourished, well groomed  and in no distress.  Eyes: PERRLA, EOMs, conjunctiva no swelling or erythema. Sinuses: (+)  frontal/maxillary tenderness ENT/Mouth: EAC's clear, TM's nl w/o erythema, bulging. Nares clear w/o erythema, swelling, exudates. Oropharynx clear without erythema or exudates. Oral hygiene is good. Tongue normal, non obstructing. Hearing intact.  Neck: Supple. Thyroid nl. Car 2+/2+ without bruits, nodes or JVD. Chest: Scattered rales, no rhonchi, few end forced expiratory wheezes. No stridor.  Cor: Heart sounds are normal.  ABD: Non-tender w/o guarding, rebound, hernias, masses or organomegaly.  Lymphatics: Unremarkable.  Musculoskeletal: Full ROM all peripheral extremities, joint stability, 5/5 strength and normal gait.  Skin: Warm, dry without exposed rashes, lesions or ecchymosis apparent.  Neuro: Cranial nerves intact, reflexes equal bilaterally. Sensory-motor testing grossly intact. Tendon reflexes grossly intact.  Pysch: Alert & oriented x 3.  Insight and judgement nl & appropriate. No  ideations.  Assessment and Plan:  1. Essential hypertension  - Continue medication, monitor blood pressure at home.  - Continue DASH diet. Reminder to go to the ER if any CP,  SOB, nausea, dizziness, severe HA, changes vision/speech. - CBC with Differential/Platelet - BASIC METABOLIC PANEL WITH GFR - Magnesium - TSH  2. Hyperlipidemia, mixed  - Continue diet/meds, exercise,& lifestyle modifications.  - Continue monitor periodic cholesterol/liver & renal functions  - Hepatic function panel - Lipid panel - TSH  3. Prediabetes   - Continue diet, exercise, lifestyle modifications.  - Monitor appropriate labs.  - Hemoglobin A1c - Insulin, random  4. Vitamin D deficiency  - Continue supplementation.  - VITAMIN D 25 Hydroxy   5. Tracheitis  6. Subacute pansinusitis  - predniSONE (DELTASONE) 20 MG tablet; 1 tab 3 x day for 3 days, then 1 tab 2 x day for 3 days, then 1 tab 1 x day for 5 days  Disp: 20 tablet  - azithromycin (ZITHROMAX) 250 MG tablet; Take 2 tablets (500 mg) on  Day 1,  followed by 1 tablet (250 mg) once daily on Days 2 through 5.  Disp: 6 each; Refill: 1  - promethazine-dextromethorphan  6.25-15 MG/5ML syrup; Take  1 to 2 tsp enery 4 hours if needed for cough  Disp: 360 mL; Refill: 1  7. Medication management  - CBC with Differential/Platelet - BASIC METABOLIC PANEL WITH GFR - Hepatic function panel - Magnesium - Lipid panel - TSH - Hemoglobin A1c - Insulin, random - VITAMIN D 25 Hydroxy         Discussed  regular exercise, BP monitoring, weight control to achieve/maintain BMI less than 25 and discussed med and SE's. Recommended labs to assess and monitor clinical status with further disposition pending results of labs. Over 30 minutes of exam, counseling, chart review was performed.

## 2017-03-13 ENCOUNTER — Encounter: Payer: Self-pay | Admitting: Internal Medicine

## 2017-03-14 LAB — BASIC METABOLIC PANEL WITH GFR
BUN: 23 mg/dL (ref 7–25)
CALCIUM: 9.7 mg/dL (ref 8.6–10.3)
CHLORIDE: 101 mmol/L (ref 98–110)
CO2: 28 mmol/L (ref 20–32)
CREATININE: 0.88 mg/dL (ref 0.70–1.33)
GFR, Est African American: 112 mL/min/{1.73_m2} (ref >=60)
GFR, Est Non African American: 97 mL/min/{1.73_m2} (ref >=60)
Glucose, Bld: 130 mg/dL — ABNORMAL HIGH (ref 65–99)
Potassium: 3.9 mmol/L (ref 3.5–5.3)
Sodium: 138 mmol/L (ref 135–146)

## 2017-03-14 LAB — CBC WITH DIFFERENTIAL/PLATELET
BASOS ABS: 78 {cells}/uL (ref 0–200)
BASOS PCT: 0.8 %
EOS ABS: 127 {cells}/uL (ref 15–500)
Eosinophils Relative: 1.3 %
HCT: 47.8 % (ref 38.5–50.0)
HEMOGLOBIN: 16.5 g/dL (ref 13.2–17.1)
Lymphs Abs: 2391 cells/uL (ref 850–3900)
MCH: 31.2 pg (ref 27.0–33.0)
MCHC: 34.5 g/dL (ref 32.0–36.0)
MCV: 90.4 fL (ref 80.0–100.0)
MONOS PCT: 7.7 %
MPV: 10.6 fL (ref 7.5–12.5)
Neutro Abs: 6448 cells/uL (ref 1500–7800)
Neutrophils Relative %: 65.8 %
Platelets: 231 10*3/uL (ref 140–400)
RBC: 5.29 10*6/uL (ref 4.20–5.80)
RDW: 12.4 % (ref 11.0–15.0)
TOTAL LYMPHOCYTE: 24.4 %
WBC mixed population: 755 cells/uL (ref 200–950)
WBC: 9.8 10*3/uL (ref 3.8–10.8)

## 2017-03-14 LAB — HEPATIC FUNCTION PANEL
AG Ratio: 1.8 (calc) (ref 1.0–2.5)
ALBUMIN MSPROF: 4.3 g/dL (ref 3.6–5.1)
ALT: 49 U/L — ABNORMAL HIGH (ref 9–46)
AST: 40 U/L — ABNORMAL HIGH (ref 10–35)
Alkaline phosphatase (APISO): 65 U/L (ref 40–115)
BILIRUBIN DIRECT: 0.1 mg/dL (ref 0.0–0.2)
GLOBULIN: 2.4 g/dL (ref 1.9–3.7)
Indirect Bilirubin: 0.3 mg/dL (calc) (ref 0.2–1.2)
TOTAL PROTEIN: 6.7 g/dL (ref 6.1–8.1)
Total Bilirubin: 0.4 mg/dL (ref 0.2–1.2)

## 2017-03-14 LAB — TSH: TSH: 1.56 m[IU]/L (ref 0.40–4.50)

## 2017-03-14 LAB — MAGNESIUM: MAGNESIUM: 1.9 mg/dL (ref 1.5–2.5)

## 2017-03-14 LAB — HEMOGLOBIN A1C
EAG (MMOL/L): 6.2 (calc)
HEMOGLOBIN A1C: 5.5 %{Hb} (ref <5.7)
Mean Plasma Glucose: 111 (calc)

## 2017-03-14 LAB — LIPID PANEL
Cholesterol: 223 mg/dL — ABNORMAL HIGH (ref <200)
HDL: 53 mg/dL (ref >40)
LDL CHOLESTEROL (CALC): 142 mg/dL — AB
Non-HDL Cholesterol (Calc): 170 mg/dL (calc) — ABNORMAL HIGH (ref <130)
TRIGLYCERIDES: 151 mg/dL — AB (ref <150)
Total CHOL/HDL Ratio: 4.2 (calc) (ref <5.0)

## 2017-03-14 LAB — VITAMIN D 25 HYDROXY (VIT D DEFICIENCY, FRACTURES): VIT D 25 HYDROXY: 57 ng/mL (ref 30–100)

## 2017-03-14 LAB — INSULIN, RANDOM: INSULIN: 76.6 u[IU]/mL — AB (ref 2.0–19.6)

## 2017-03-27 DIAGNOSIS — M23331 Other meniscus derangements, other medial meniscus, right knee: Secondary | ICD-10-CM | POA: Diagnosis not present

## 2017-03-27 DIAGNOSIS — M8430XA Stress fracture, unspecified site, initial encounter for fracture: Secondary | ICD-10-CM | POA: Diagnosis not present

## 2017-03-27 DIAGNOSIS — M25561 Pain in right knee: Secondary | ICD-10-CM | POA: Diagnosis not present

## 2017-04-22 HISTORY — PX: COLONOSCOPY WITH PROPOFOL: SHX5780

## 2017-04-22 HISTORY — PX: POLYPECTOMY: SHX149

## 2017-04-22 HISTORY — PX: KNEE ARTHROSCOPY: SUR90

## 2017-04-22 HISTORY — PX: COLONOSCOPY: SHX174

## 2017-05-01 DIAGNOSIS — M84352D Stress fracture, left femur, subsequent encounter for fracture with routine healing: Secondary | ICD-10-CM | POA: Diagnosis not present

## 2017-05-08 ENCOUNTER — Ambulatory Visit (INDEPENDENT_AMBULATORY_CARE_PROVIDER_SITE_OTHER): Payer: 59 | Admitting: Physician Assistant

## 2017-05-08 ENCOUNTER — Encounter: Payer: Self-pay | Admitting: Physician Assistant

## 2017-05-08 VITALS — BP 130/82 | HR 91 | Temp 97.9°F | Resp 18 | Ht 69.0 in | Wt 237.6 lb

## 2017-05-08 DIAGNOSIS — F17200 Nicotine dependence, unspecified, uncomplicated: Secondary | ICD-10-CM | POA: Diagnosis not present

## 2017-05-08 DIAGNOSIS — J0100 Acute maxillary sinusitis, unspecified: Secondary | ICD-10-CM

## 2017-05-08 MED ORDER — PROMETHAZINE-DM 6.25-15 MG/5ML PO SYRP: 5.0000 mL | ORAL_SOLUTION | Freq: Four times a day (QID) | ORAL | 1 refills | Status: DC | PRN

## 2017-05-08 MED ORDER — PREDNISONE 20 MG PO TABS: ORAL_TABLET | ORAL | 0 refills | Status: DC

## 2017-05-08 MED ORDER — LEVOFLOXACIN 500 MG PO TABS: 500.0000 mg | ORAL_TABLET | Freq: Every day | ORAL | 0 refills | Status: DC

## 2017-05-08 NOTE — Progress Notes (Signed)
Subjective:    Patient ID: Javier Vega, male    DOB: [birth year], 57 y.o.   MRN: [id number]  HPI 57 y.o. smoking, obese WM with OSA presents with cough.  Son has been sick. Patient has had sinus congestion x 1 week getting worse now with 3 days chills, productive cough. Has been on mucinex, he is on xzyal and flonase. He is out of the albuterol.  Last CXR 2016  Blood pressure 130/82, pulse 91, temperature 97.9 F (36.6 C), resp. rate 18, height 5\' 9"  (1.753 m), weight 237 lb 9.6 oz (107.8 kg), SpO2 97 %.  Medications Current Outpatient Medications on File Prior to Visit  Medication Sig  . albuterol (VENTOLIN HFA) 108 (90 Base) MCG/ACT inhaler Inhale 2 puffs into the lungs every 4 (four) hours as needed for wheezing or shortness of breath.  . Ascorbic Acid (VITAMIN C PO) Take by mouth daily.  Marland Kitchen aspirin 81 MG chewable tablet Chew 81 mg by mouth daily.  Marland Kitchen atorvastatin (LIPITOR) 80 MG tablet TAKE 1/2 TO 1 TABLET BY MOUTH EACH DAY AS DIRECTED FOR CHOLESTEROL  . Cholecalciferol (VITAMIN D PO) Take 8,000 Int'l Units by mouth daily.  . fluticasone (FLONASE) 50 MCG/ACT nasal spray Place 2 sprays into both nostrils at bedtime.  Marland Kitchen levocetirizine (XYZAL) 5 MG tablet Take 5 mg by mouth every evening.  . meclizine (ANTIVERT) 25 MG tablet Take 1 tablet (25 mg total) by mouth 3 (three) times daily as needed for dizziness.  . Multiple Vitamins-Minerals (MENS MULTIVITAMIN PLUS) TABS Take by mouth daily.  . Omega-3 Fatty Acids (FISH OIL PO) Take by mouth 3 (three) times daily.    No current facility-administered medications on file prior to visit.     Problem list He has Hyperlipidemia; Vitamin D deficiency; Prediabetes; Essential hypertension; Medication management; Obesity; Tobacco use disorder; OSA / Intolerant to CPAP; Right maxillary sinusitis, chronic; and BMI 34.0-34.9,adult on their problem list.   Review of Systems  Constitutional: Negative for chills, diaphoresis and fever.  HENT:  Positive for congestion, sinus pressure, sneezing and sore throat. Negative for ear pain, trouble swallowing and voice change.   Eyes: Negative.   Respiratory: Positive for cough. Negative for chest tightness, shortness of breath and wheezing.   Cardiovascular: Negative.   Gastrointestinal: Negative.   Genitourinary: Negative.   Musculoskeletal: Negative for neck pain.  Neurological: Negative.  Negative for headaches.       Objective:   Physical Exam  Constitutional: He is oriented to person, place, and time. He appears well-developed and well-nourished.  HENT:  Head: Normocephalic and atraumatic.  Right Ear: Hearing and tympanic membrane normal.  Left Ear: Hearing and tympanic membrane normal.  Nose: Right sinus exhibits maxillary sinus tenderness. Left sinus exhibits maxillary sinus tenderness.  Mouth/Throat: Uvula is midline and mucous membranes are normal. Posterior oropharyngeal erythema present. No oropharyngeal exudate, posterior oropharyngeal edema or tonsillar abscesses.  Eyes: Conjunctivae are normal. Pupils are equal, round, and reactive to light.  Neck: Normal range of motion. Neck supple.  Cardiovascular: Normal rate and regular rhythm.  Pulmonary/Chest: Effort normal and breath sounds normal.  Abdominal: Soft. Bowel sounds are normal.  Musculoskeletal: Normal range of motion.  Lymphadenopathy:    He has no cervical adenopathy.  Neurological: He is alert and oriented to person, place, and time.  Skin: Skin is warm and dry. No rash noted.       Assessment & Plan:   Acute non-recurrent maxillary sinusitis -     predniSONE (DELTASONE)  20 MG tablet; 2 tablets daily for 3 days, 1 tablet daily for 4 days. -     levofloxacin (LEVAQUIN) 500 MG tablet; Take 1 tablet (500 mg total) by mouth daily. -     promethazine-dextromethorphan (PROMETHAZINE-DM) 6.25-15 MG/5ML syrup; Take 5 mLs by mouth 4 (four) times daily as needed for cough.  Tobacco use disorder -     DG Chest 2  View; Future Smoking cessation-  instruction/counseling given, counseled patient on the dangers of tobacco use, advised patient to stop smoking, and reviewed strategies to maximize success, patient not ready to quit at this time.    The patient was advised to call immediately if he has any concerning symptoms in the interval. The patient voices understanding of current treatment options and is in agreement with the current care plan.The patient knows to call the clinic with any problems, questions or concerns or go to the ER if any further progression of symptoms.   Future Appointments  Date Time Provider [place]  05/08/2017  2:45 PM Vicie Mutters, PA-C [facility] None  06/17/2017  3:30 PM Liane Comber, NP [facility] None  09/02/2017  3:00 PM Unk Pinto, MD [facility] None

## 2017-05-08 NOTE — Patient Instructions (Signed)
Rhinitis medicamentosa -   Rhinitis medicamentosa is a type of rhinitis that develops as a result of overuse of over-the-counter decongestant nasal sprays.   If patients use an over the counter nasal spray containing a nasal decongestant (eg, oxymetazoline or phenylephrine), this can provide temporary help but NEEDS TO BE STOPPED AFTER 2-3 DAYS OR IF CAN LEAD TO ADDICTION. Although these sprays can give rapid relief of congestion when used occasionally, the effects lessen if they are used regularly. Over time, many patients become tolerant to their effects. When this occurs, decongestant sprays actually worsen symptoms, causing the nose to swell unless the spray is used. In such instances it may be difficult to discontinue the spray, and to do so, a medical professional may be needed.  Rhinitis medicamentosa is treated by discontinuing the drug that is causing the condition. Steroid nasal sprays can speed the recovery from this condition.  Make sure you are on an allergy pill, see below for more details. Please take the prednisone as directed below, this is NOT an antibiotic so you do NOT have to finish it. You can take it for a few days and stop it if you are doing better.   Please take the prednisone to help decrease inflammation and therefore decrease symptoms. Take it it with food to avoid GI upset. It can cause increased energy but on the other hand it can make it hard to sleep at night so please take it AT [place], it takes 8-12 hours to start working so it will NOT affect your sleeping if you take it at night with your food!!  If you are diabetic it will increase your sugars so decrease carbs and monitor your sugars closely.     HOW TO TREAT VIRAL COUGH AND COLD SYMPTOMS:  -Symptoms usually last at least 1 week with the worst symptoms being around day 4.  - colds usually start with a sore throat and end with a cough, and the cough can take 2 weeks to get better.  -No antibiotics  are needed for colds, flu, sore throats, cough, bronchitis UNLESS symptoms are longer than 7 days OR if you are getting better then get drastically worse.  -There are a lot of combination medications (Dayquil, Nyquil, Vicks 44, tyelnol cold and sinus, ETC). Please look at the ingredients on the back so that you are treating the correct symptoms and not doubling up on medications/ingredients.    Medicines you can use  Nasal congestion  - pseudoephedrine (Sudafed)- behind the counter, do not use if you have high blood pressure, medicine that have -D in them.  - phenylephrine (Sudafed PE) -Dextormethorphan + chlorpheniramine (Coridcidin HBP)- okay if you have high blood pressure -Oxymetazoline (Afrin) nasal spray- LIMIT to 3 days -Saline nasal spray -Neti pot (used distilled or bottled water)  Ear pain/congestion  -pseudoephedrine (sudafed) - Nasonex/flonase nasal spray  Fever  -Acetaminophen (Tyelnol) -Ibuprofen (Advil, motrin, aleve)  Sore Throat  -Acetaminophen (Tyelnol) -Ibuprofen (Advil, motrin, aleve) -Drink a lot of water -Gargle with salt water - Rest your voice (don't talk) -Throat sprays -Cough drops  Body Aches  -Acetaminophen (Tyelnol) -Ibuprofen (Advil, motrin, aleve)  Headache  -Acetaminophen (Tyelnol) -Ibuprofen (Advil, motrin, aleve) - Exedrin, Exedrin Migraine  Allergy symptoms (cough, sneeze, runny nose, itchy eyes) -Claritin or loratadine cheapest but likely the weakest  -Zyrtec or certizine at night because it can make you sleepy -The strongest is allegra or fexafinadine  Cheapest at [employer], [employer], [employer]  Cough  -Dextromethorphan (Delsym)-  medicine that has DM in it -Guafenesin (Mucinex/Robitussin) - cough drops - drink lots of water  Chest Congestion  -Guafenesin (Mucinex/Robitussin)  Red Itchy Eyes  - Naphcon-A  Upset Stomach  - Bland diet (nothing spicy, greasy, fried, and high acid foods like tomatoes, oranges, berries) -OKAY- cereal,  bread, soup, crackers, rice -Eat smaller more frequent meals -reduce caffeine, no alcohol -Loperamide (Imodium-AD) if diarrhea -Prevacid for heart burn  General health when sick  -Hydration -wash your hands frequently -keep surfaces clean -change pillow cases and sheets often -Get fresh air but do not exercise strenuously -Vitamin D, double up on it - Vitamin C -Zinc

## 2017-05-12 ENCOUNTER — Ambulatory Visit (HOSPITAL_COMMUNITY)
Admission: RE | Admit: 2017-05-12 | Discharge: 2017-05-12 | Disposition: A | Discharge status: Home / Self Care | Payer: 59 | Source: Ambulatory Visit | Attending: Physician Assistant | Admitting: Physician Assistant

## 2017-05-12 ENCOUNTER — Encounter (HOSPITAL_COMMUNITY): Payer: Self-pay

## 2017-05-12 ENCOUNTER — Encounter: Payer: Self-pay | Admitting: Physician Assistant

## 2017-05-12 DIAGNOSIS — F17200 Nicotine dependence, unspecified, uncomplicated: Secondary | ICD-10-CM | POA: Insufficient documentation

## 2017-05-12 DIAGNOSIS — I700 Atherosclerosis of aorta: Secondary | ICD-10-CM | POA: Insufficient documentation

## 2017-05-12 DIAGNOSIS — R0989 Other specified symptoms and signs involving the circulatory and respiratory systems: Secondary | ICD-10-CM | POA: Diagnosis not present

## 2017-05-12 DIAGNOSIS — R05 Cough: Secondary | ICD-10-CM | POA: Diagnosis not present

## 2017-05-29 DIAGNOSIS — M25562 Pain in left knee: Secondary | ICD-10-CM | POA: Diagnosis not present

## 2017-05-29 DIAGNOSIS — M84352D Stress fracture, left femur, subsequent encounter for fracture with routine healing: Secondary | ICD-10-CM | POA: Diagnosis not present

## 2017-06-16 NOTE — Progress Notes (Signed)
FOLLOW UP  Assessment and Plan:   Hypertension Fair controlled with current medications - previously well controlled; patient to monitor and notify - if remains elevated at next visit will adjust Monitor blood pressure at home; patient to call if consistently greater than 130/80 Continue DASH diet.   Reminder to go to the ER if any CP, SOB, nausea, dizziness, severe HA, changes vision/speech, left arm numbness and tingling and jaw pain.  Cholesterol Currently above goal; has been off of atorvastatin for a few months - refill and restart at previous dose 40 mg daily Continue low cholesterol diet and exercise.  Check lipid panel.   Other abnormal glucose Recent A1Cs at goal Continue diet and exercise.  Perform daily foot/skin check, notify office of any concerning changes.  Defer A1C; check BMP  Obesity with co morbidities Long discussion about weight loss, diet, and exercise Recommended diet heavy in fruits and veggies and low in animal meats, cheeses, and dairy products, appropriate calorie intake Discussed ideal weight for height and weight goal - 200# by end of year Discussed risks associated with orthopedic surgery Patient will work on improved choices Will follow up in 3 months  Vitamin D Def At goal at last visit; continue supplementation to maintain goal of 70-100 Defer Vit D level  Smoking cessation -  instruction/counseling given, counseled patient on the dangers of tobacco use, advised patient to stop smoking, and reviewed strategies to maximize success, patient not ready to quit at this time.    Continue diet and meds as discussed. Further disposition pending results of labs. Discussed med's effects and SE's.   Over 30 minutes of exam, counseling, chart review, and critical decision making was performed.   Future Appointments  Date Time Provider [place]  06/17/2017  3:30 PM Liane Comber, NP [facility] None  09/02/2017  3:00 PM Unk Pinto, MD  [facility] None    ----------------------------------------------------------------------------------------------------------------------  HPI 57 y.o. male  presents for 3 month follow up on hypertension, cholesterol, glucose management, obesity and vitamin D deficiency. He has ongoing R knee pain; he is followed by Dr. Veverly Fells and is discussing TKA.  he currently continues to smoke 5 cigarettes a day; discussed risks associated with smoking, patient is not ready to quit. Has tried chantix and wellbutrin in the past. Does plan to reduce later this year.   BMI is Body mass index is 35.88 kg/m., he has been working on diet - has been cutting portions in half. He is limited on exercise.  Wt Readings from Last 3 Encounters:  06/17/17 243 lb (110.2 kg)  05/08/17 237 lb 9.6 oz (107.8 kg)  03/12/17 243 lb 6.4 oz (110.4 kg)   He does not currently check BPs at home, today their BP is BP: 140/86  He does not workout. He denies chest pain, shortness of breath, dizziness.   He is on cholesterol medication (atorvastatin 40 mg daily, but has been out for a couple months )and denies myalgias. His cholesterol is not at goal. The cholesterol last visit was:   Lab Results  Component Value Date   CHOL 223 (H) 03/12/2017   HDL 53 03/12/2017   LDLCALC 65 07/30/2016   TRIG 151 (H) 03/12/2017   CHOLHDL 4.2 03/12/2017    He has been working on diet and exercise for glucose management (hx of prediabetes), and denies increased appetite, nausea, paresthesia of the feet, polydipsia, polyuria, visual disturbances, vomiting and weight loss. Last A1C in the office was:  Lab Results  Component Value  Date   HGBA1C 5.5 03/12/2017   Patient is on Vitamin D supplement but below goal of 70 at last check:    Lab Results  Component Value Date   VD25OH 57 03/12/2017       Current Medications:  Current Outpatient Medications on File Prior to Visit  Medication Sig  . Ascorbic Acid (VITAMIN C PO) Take by mouth  daily.  Marland Kitchen aspirin 81 MG chewable tablet Chew 81 mg by mouth daily.  . Cholecalciferol (VITAMIN D PO) Take 8,000 Int'l Units by mouth daily.  . fluticasone (FLONASE) 50 MCG/ACT nasal spray Place 2 sprays into both nostrils at bedtime.  Marland Kitchen levocetirizine (XYZAL) 5 MG tablet Take 5 mg by mouth every evening.  . meclizine (ANTIVERT) 25 MG tablet Take 1 tablet (25 mg total) by mouth 3 (three) times daily as needed for dizziness.  . Multiple Vitamins-Minerals (MENS MULTIVITAMIN PLUS) TABS Take by mouth daily.  . Omega-3 Fatty Acids (FISH OIL PO) Take by mouth 3 (three) times daily.   Marland Kitchen albuterol (VENTOLIN HFA) 108 (90 Base) MCG/ACT inhaler Inhale 2 puffs into the lungs every 4 (four) hours as needed for wheezing or shortness of breath. (Patient not taking: Reported on 06/17/2017)  . atorvastatin (LIPITOR) 80 MG tablet TAKE 1/2 TO 1 TABLET BY MOUTH EACH DAY AS DIRECTED FOR CHOLESTEROL (Patient not taking: Reported on 06/17/2017)  . levofloxacin (LEVAQUIN) 500 MG tablet Take 1 tablet (500 mg total) by mouth daily. (Patient not taking: Reported on 06/17/2017)  . predniSONE (DELTASONE) 20 MG tablet 2 tablets daily for 3 days, 1 tablet daily for 4 days. (Patient not taking: Reported on 06/17/2017)  . promethazine-dextromethorphan (PROMETHAZINE-DM) 6.25-15 MG/5ML syrup Take 5 mLs by mouth 4 (four) times daily as needed for cough. (Patient not taking: Reported on 06/17/2017)   No current facility-administered medications on file prior to visit.      Allergies:  Allergies  Allergen Reactions  . Crestor [Rosuvastatin]      Medical History:  Past Medical History:  Diagnosis Date  . Allergy    SEASONAL  . Anal fissure   . Arthritis   . Hyperlipidemia   . Hypertension   . Sleep apnea   . Vitamin D deficiency    Family history- Reviewed and unchanged Social history- Reviewed and unchanged   Review of Systems:  Review of Systems  Constitutional: Negative for malaise/fatigue and weight loss.  HENT:  Negative for hearing loss and tinnitus.   Eyes: Negative for blurred vision and double vision.  Respiratory: Negative for cough, shortness of breath and wheezing.   Cardiovascular: Negative for chest pain, palpitations, orthopnea, claudication and leg swelling.  Gastrointestinal: Negative for abdominal pain, blood in stool, constipation, diarrhea, heartburn, melena, nausea and vomiting.  Genitourinary: Negative.   Musculoskeletal: Negative for joint pain and myalgias.  Skin: Negative for rash.  Neurological: Negative for dizziness, tingling, sensory change, weakness and headaches.  Endo/Heme/Allergies: Negative for polydipsia.  Psychiatric/Behavioral: Negative.   All other systems reviewed and are negative.     Physical Exam: BP 140/86   Pulse 91   Temp 98.2 F (36.8 C)   Ht 5\' 9"  (1.753 m)   Wt 243 lb (110.2 kg)   SpO2 97%   BMI 35.88 kg/m  Wt Readings from Last 3 Encounters:  06/17/17 243 lb (110.2 kg)  05/08/17 237 lb 9.6 oz (107.8 kg)  03/12/17 243 lb 6.4 oz (110.4 kg)   General Appearance: Well nourished, in no apparent distress. Eyes: PERRLA, EOMs, conjunctiva  no swelling or erythema Sinuses: No Frontal/maxillary tenderness ENT/Mouth: Ext aud canals clear, TMs without erythema, bulging. No erythema, swelling, or exudate on post pharynx.  Tonsils not swollen or erythematous. Hearing normal.  Neck: Supple, thyroid normal.  Respiratory: Respiratory effort normal, BS equal bilaterally without rales, rhonchi, wheezing or stridor.  Cardio: RRR with no MRGs. Brisk peripheral pulses without edema.  Abdomen: Soft, + BS.  Non tender, no guarding, rebound, hernias, masses. Lymphatics: Non tender without lymphadenopathy.  Musculoskeletal: Full ROM, 5/5 strength, Antalgic gait walking with cane Skin: Warm, dry without rashes, lesions, ecchymosis.  Neuro: Cranial nerves intact. No cerebellar symptoms.  Psych: Awake and oriented X 3, normal affect, Insight and Judgment appropriate.     Izora Ribas, NP 3:21 PM [facility]

## 2017-06-17 ENCOUNTER — Ambulatory Visit (INDEPENDENT_AMBULATORY_CARE_PROVIDER_SITE_OTHER): Payer: 59 | Admitting: Adult Health

## 2017-06-17 ENCOUNTER — Encounter: Payer: Self-pay | Admitting: Adult Health

## 2017-06-17 VITALS — BP 140/86 | HR 91 | Temp 98.2°F | Ht 69.0 in | Wt 243.0 lb

## 2017-06-17 DIAGNOSIS — F17200 Nicotine dependence, unspecified, uncomplicated: Secondary | ICD-10-CM

## 2017-06-17 DIAGNOSIS — E559 Vitamin D deficiency, unspecified: Secondary | ICD-10-CM

## 2017-06-17 DIAGNOSIS — Z6835 Body mass index (BMI) 35.0-35.9, adult: Secondary | ICD-10-CM | POA: Diagnosis not present

## 2017-06-17 DIAGNOSIS — I10 Essential (primary) hypertension: Secondary | ICD-10-CM

## 2017-06-17 DIAGNOSIS — E782 Mixed hyperlipidemia: Secondary | ICD-10-CM | POA: Diagnosis not present

## 2017-06-17 DIAGNOSIS — G8929 Other chronic pain: Secondary | ICD-10-CM

## 2017-06-17 DIAGNOSIS — Z79899 Other long term (current) drug therapy: Secondary | ICD-10-CM

## 2017-06-17 DIAGNOSIS — M25561 Pain in right knee: Secondary | ICD-10-CM

## 2017-06-17 DIAGNOSIS — R7309 Other abnormal glucose: Secondary | ICD-10-CM

## 2017-06-17 MED ORDER — ATORVASTATIN CALCIUM 80 MG PO TABS: ORAL_TABLET | ORAL | 1 refills | Status: DC

## 2017-06-17 NOTE — Patient Instructions (Signed)
Aim for 7+ servings of fruits and vegetables daily  80+ fluid ounces of water or unsweet tea for healthy kidneys  Limit alcohol  Limit animal fats in diet for cholesterol and heart health - choose grass fed whenever available  Aim for low stress - take time to unwind and care for your mental health  Aim for 150 min of moderate intensity exercise weekly for heart health, and weights twice weekly for bone health  Aim for 7-9 hours of sleep daily      When it comes to diets, agreement about the perfect plan isn't easy to find, even among the experts. Experts at the [employer] developed an idea known as the Healthy Eating Plate. Just imagine a plate divided into logical, healthy portions.  The emphasis is on diet quality:  Load up on vegetables and fruits - one-half of your plate: Aim for color and variety, and remember that potatoes don't count.  Go for whole grains - one-quarter of your plate: Whole wheat, barley, wheat berries, quinoa, oats, brown rice, and foods made with them. If you want pasta, go with whole wheat pasta.  Protein power - one-quarter of your plate: Fish, chicken, beans, and nuts are all healthy, versatile protein sources. Limit red meat.  The diet, however, does go beyond the plate, offering a few other suggestions.  Use healthy plant oils, such as olive, canola, soy, corn, sunflower and peanut. Check the labels, and avoid partially hydrogenated oil, which have unhealthy trans fats.  If you're thirsty, drink water. Coffee and tea are good in moderation, but skip sugary drinks and limit milk and dairy products to one or two daily servings.  The type of carbohydrate in the diet is more important than the amount. Some sources of carbohydrates, such as vegetables, fruits, whole grains, and beans-are healthier than others.  Finally, stay active.      Ways to cut 100 calories  1. Eat your eggs with hot sauce OR salsa instead of cheese.    Eggs are great for breakfast, but many people consider eggs and cheese to be BFFs. Instead of cheese-1 oz. of cheddar has 114 calories-top your eggs with hot sauce, which contains no calories and helps with satiety and metabolism. Salsa is also a great option!!  2. Top your toast, waffles or pancakes with mashed berries instead of jelly or syrup. Half a cup of berries-fresh, frozen or thawed-has about 40 calories, compared with 2 tbsp. of maple syrup or jelly, which both have about 100 calories. The berries will also give you a good punch of fiber, which helps keep you full and satisfied and won't spike blood sugar quickly like the jelly or syrup. 3. Swap the non-fat latte for black coffee with a splash of half-and-half. Contrary to its name, that non-fat latte has 130 calories and a startling 19g of carbohydrates per 16 oz. serving. Replacing that 'light' drinkable dessert with a black coffee with a splash of half-and-half saves you more than 100 calories per 16 oz. serving. 4. Sprinkle salads with freeze-dried raspberries instead of dried cranberries. If you want a sweet addition to your nutritious salad, stay away from dried cranberries. They have a whopping 130 calories per  cup and 30g carbohydrates. Instead, sprinkle freeze-dried raspberries guilt-free and save more than 100 calories per  cup serving, adding 3g of belly-filling fiber. 5. Go for mustard in place of mayo on your sandwich. Mustard can add really nice flavor to any sandwich, and there  are tons of varieties, from spicy to honey. A serving of mayo is 95 calories, versus 10 calories in a serving of mustard. 6. Choose a DIY salad dressing instead of the store-bought kind. Mix Dijon or whole grain mustard with low-fat Kefir or red wine vinegar and garlic. 7. Use hummus as a spread instead of a dip. Use hummus as a spread on a high-fiber cracker or tortilla with a sandwich and save on calories without sacrificing taste. 8. Pick just one  salad "accessory." Salad isn't automatically a calorie winner. It's easy to over-accessorize with toppings. Instead of topping your salad with nuts, avocado and cranberries (all three will clock in at 313 calories), just pick one. The next day, choose a different accessory, which will also keep your salad interesting. You don't wear all your jewelry every day, right? 9. Ditch the white pasta in favor of spaghetti squash. One cup of cooked spaghetti squash has about 40 calories, compared with traditional spaghetti, which comes with more than 200. Spaghetti squash is also nutrient-dense. It's a good source of fiber and Vitamins A and C, and it can be eaten just like you would eat pasta-with a great tomato sauce and [place] meatballs or with pesto, tofu and spinach, for example. 10. Dress up your chili, soups and stews with non-fat [place] yogurt instead of sour cream. Just a 'dollop' of sour cream can set you back 115 calories and a whopping 12g of fat-seven of which are of the artery-clogging variety. Added bonus: [place] yogurt is packed with muscle-building protein, calcium and B Vitamins. 11. Mash cauliflower instead of mashed potatoes. One cup of traditional mashed potatoes-in all their creamy goodness-has more than 200 calories, compared to mashed cauliflower, which you can typically eat for less than 100 calories per 1 cup serving. Cauliflower is a great source of the antioxidant indole-3-carbinol (I3C), which may help reduce the risk of some cancers, like breast cancer. 12. Ditch the ice cream sundae in favor of a [place] yogurt parfait. Instead of a cup of ice cream or fro-yo for dessert, try 1 cup of nonfat Greek yogurt topped with fresh berries and a sprinkle of cacao nibs. Both toppings are packed with antioxidants, which can help reduce cellular inflammation and oxidative damage. And the comparison is a no-brainer: One cup of ice cream has about 275 calories; one cup of frozen yogurt has about 230; and  a cup of Greek yogurt has just 130, plus twice the protein, so you're less likely to return to the freezer for a second helping. 13. Put olive oil in a spray container instead of using it directly from the bottle. Each tablespoon of olive oil is 120 calories and 15g of fat. Use a mister instead of pouring it straight into the pan or onto a salad. This allows for portion control and will save you more than 100 calories. 14. When baking, substitute canned pumpkin for butter or oil. Canned pumpkin-not pumpkin pie mix-is loaded with Vitamin A, which is important for skin and eye health, as well as immunity. And the comparisons are pretty crazy:  cup of canned pumpkin has about 40 calories, compared to butter or oil, which has more than 800 calories. Yes, 800 calories. Applesauce and mashed banana can also serve as good substitutions for butter or oil, usually in a 1:1 ratio. 15. Top casseroles with high-fiber cereal instead of breadcrumbs. Breadcrumbs are typically made with white bread, while breakfast cereals contain 5-9g of fiber per serving. Not only will you  save more than 150 calories per  cup serving, the swap will also keep you more full and you'll get a metabolism boost from the added fiber. 16. Snack on pistachios instead of macadamia nuts. Believe it or not, you get the same amount of calories from 35 pistachios (100 calories) as you would from only five macadamia nuts. 17. Chow down on kale chips rather than potato chips. This is my favorite 'don't knock it 'till you try it' swap. Kale chips are so easy to make at home, and you can spice them up with a little grated parmesan or chili powder. Plus, they're a mere fraction of the calories of potato chips, but with the same crunch factor we crave so often. 18. Add seltzer and some fruit slices to your cocktail instead of soda or fruit juice. One cup of soda or fruit juice can pack on as much as 140 calories. Instead, use seltzer and fruit slices.  The fruit provides valuable phytochemicals, such as flavonoids and anthocyanins, which help to combat cancer and stave off the aging process.

## 2017-06-18 LAB — BASIC METABOLIC PANEL WITH GFR
BUN: 17 mg/dL (ref 7–25)
CHLORIDE: 103 mmol/L (ref 98–110)
CO2: 29 mmol/L (ref 20–32)
Calcium: 10 mg/dL (ref 8.6–10.3)
Creat: 0.94 mg/dL (ref 0.70–1.33)
GFR, Est African American: 105 mL/min/{1.73_m2} (ref >=60)
GFR, Est Non African American: 90 mL/min/{1.73_m2} (ref >=60)
GLUCOSE: 89 mg/dL (ref 65–99)
POTASSIUM: 4.3 mmol/L (ref 3.5–5.3)
SODIUM: 140 mmol/L (ref 135–146)

## 2017-06-18 LAB — CBC WITH DIFFERENTIAL/PLATELET
BASOS ABS: 104 {cells}/uL (ref 0–200)
Basophils Relative: 1 %
EOS ABS: 239 {cells}/uL (ref 15–500)
EOS PCT: 2.3 %
HCT: 48.2 % (ref 38.5–50.0)
Hemoglobin: 16.8 g/dL (ref 13.2–17.1)
Lymphs Abs: 2881 cells/uL (ref 850–3900)
MCH: 31.6 pg (ref 27.0–33.0)
MCHC: 34.9 g/dL (ref 32.0–36.0)
MCV: 90.6 fL (ref 80.0–100.0)
MONOS PCT: 9.5 %
MPV: 10.8 fL (ref 7.5–12.5)
Neutro Abs: 6188 cells/uL (ref 1500–7800)
Neutrophils Relative %: 59.5 %
PLATELETS: 257 10*3/uL (ref 140–400)
RBC: 5.32 10*6/uL (ref 4.20–5.80)
RDW: 12.2 % (ref 11.0–15.0)
TOTAL LYMPHOCYTE: 27.7 %
WBC mixed population: 988 cells/uL — ABNORMAL HIGH (ref 200–950)
WBC: 10.4 10*3/uL (ref 3.8–10.8)

## 2017-06-18 LAB — HEPATIC FUNCTION PANEL
AG Ratio: 1.6 (calc) (ref 1.0–2.5)
ALKALINE PHOSPHATASE (APISO): 66 U/L (ref 40–115)
ALT: 43 U/L (ref 9–46)
AST: 38 U/L — AB (ref 10–35)
Albumin: 4.5 g/dL (ref 3.6–5.1)
BILIRUBIN INDIRECT: 0.4 mg/dL (ref 0.2–1.2)
Bilirubin, Direct: 0.1 mg/dL (ref 0.0–0.2)
Globulin: 2.9 g/dL (calc) (ref 1.9–3.7)
TOTAL PROTEIN: 7.4 g/dL (ref 6.1–8.1)
Total Bilirubin: 0.5 mg/dL (ref 0.2–1.2)

## 2017-06-18 LAB — LIPID PANEL
CHOLESTEROL: 245 mg/dL — AB (ref <200)
HDL: 41 mg/dL (ref >40)
LDL Cholesterol (Calc): 158 mg/dL (calc) — ABNORMAL HIGH
Non-HDL Cholesterol (Calc): 204 mg/dL (calc) — ABNORMAL HIGH (ref <130)
Total CHOL/HDL Ratio: 6 (calc) — ABNORMAL HIGH (ref <5.0)
Triglycerides: 280 mg/dL — ABNORMAL HIGH (ref <150)

## 2017-06-18 LAB — TSH: TSH: 1.73 mIU/L (ref 0.40–4.50)

## 2017-07-01 DIAGNOSIS — S83281D Other tear of lateral meniscus, current injury, right knee, subsequent encounter: Secondary | ICD-10-CM | POA: Diagnosis not present

## 2017-07-01 DIAGNOSIS — M545 Low back pain: Secondary | ICD-10-CM | POA: Diagnosis not present

## 2017-07-01 DIAGNOSIS — S83249A Other tear of medial meniscus, current injury, unspecified knee, initial encounter: Secondary | ICD-10-CM | POA: Insufficient documentation

## 2017-07-01 DIAGNOSIS — M25511 Pain in right shoulder: Secondary | ICD-10-CM | POA: Diagnosis not present

## 2017-07-01 DIAGNOSIS — M542 Cervicalgia: Secondary | ICD-10-CM | POA: Diagnosis not present

## 2017-07-01 DIAGNOSIS — M9901 Segmental and somatic dysfunction of cervical region: Secondary | ICD-10-CM | POA: Diagnosis not present

## 2017-07-01 DIAGNOSIS — S83241D Other tear of medial meniscus, current injury, right knee, subsequent encounter: Secondary | ICD-10-CM | POA: Diagnosis not present

## 2017-07-07 ENCOUNTER — Other Ambulatory Visit: Payer: Self-pay | Admitting: Internal Medicine

## 2017-07-22 DIAGNOSIS — B9689 Other specified bacterial agents as the cause of diseases classified elsewhere: Secondary | ICD-10-CM | POA: Diagnosis not present

## 2017-07-22 DIAGNOSIS — R0981 Nasal congestion: Secondary | ICD-10-CM | POA: Diagnosis not present

## 2017-07-22 DIAGNOSIS — J329 Chronic sinusitis, unspecified: Secondary | ICD-10-CM | POA: Diagnosis not present

## 2017-07-23 ENCOUNTER — Telehealth: Payer: Self-pay | Admitting: Internal Medicine

## 2017-07-23 NOTE — Telephone Encounter (Signed)
[facility] called: Pre Op Anesthesia provider  requesting, Sleep Study report, office visit, and  labs. faxed to [phone]. [facility] Attn: Juliann Pulse

## 2017-07-30 DIAGNOSIS — S83231A Complex tear of medial meniscus, current injury, right knee, initial encounter: Secondary | ICD-10-CM | POA: Diagnosis not present

## 2017-07-30 DIAGNOSIS — G8918 Other acute postprocedural pain: Secondary | ICD-10-CM | POA: Diagnosis not present

## 2017-07-30 DIAGNOSIS — M94261 Chondromalacia, right knee: Secondary | ICD-10-CM | POA: Diagnosis not present

## 2017-08-22 DIAGNOSIS — M25561 Pain in right knee: Secondary | ICD-10-CM | POA: Diagnosis not present

## 2017-09-02 ENCOUNTER — Encounter: Payer: Self-pay | Admitting: Internal Medicine

## 2017-09-04 DIAGNOSIS — M25561 Pain in right knee: Secondary | ICD-10-CM | POA: Diagnosis not present

## 2017-09-05 DIAGNOSIS — J3089 Other allergic rhinitis: Secondary | ICD-10-CM | POA: Diagnosis not present

## 2017-09-05 DIAGNOSIS — E7800 Pure hypercholesterolemia, unspecified: Secondary | ICD-10-CM | POA: Diagnosis not present

## 2017-09-05 DIAGNOSIS — J019 Acute sinusitis, unspecified: Secondary | ICD-10-CM | POA: Diagnosis not present

## 2017-09-05 DIAGNOSIS — B9689 Other specified bacterial agents as the cause of diseases classified elsewhere: Secondary | ICD-10-CM | POA: Diagnosis not present

## 2017-10-09 ENCOUNTER — Encounter: Payer: Self-pay | Admitting: Gastroenterology

## 2017-10-27 ENCOUNTER — Other Ambulatory Visit: Payer: Self-pay | Admitting: Geriatric Medicine

## 2017-10-27 ENCOUNTER — Ambulatory Visit
Admission: RE | Admit: 2017-10-27 | Discharge: 2017-10-27 | Disposition: A | Discharge status: Home / Self Care | Payer: 59 | Source: Ambulatory Visit | Attending: Geriatric Medicine | Admitting: Geriatric Medicine

## 2017-10-27 DIAGNOSIS — R7611 Nonspecific reaction to tuberculin skin test without active tuberculosis: Secondary | ICD-10-CM | POA: Diagnosis not present

## 2017-10-27 DIAGNOSIS — F17210 Nicotine dependence, cigarettes, uncomplicated: Secondary | ICD-10-CM | POA: Diagnosis not present

## 2017-10-27 DIAGNOSIS — J309 Allergic rhinitis, unspecified: Secondary | ICD-10-CM | POA: Diagnosis not present

## 2017-10-27 DIAGNOSIS — E7800 Pure hypercholesterolemia, unspecified: Secondary | ICD-10-CM | POA: Diagnosis not present

## 2017-10-27 DIAGNOSIS — Z125 Encounter for screening for malignant neoplasm of prostate: Secondary | ICD-10-CM | POA: Diagnosis not present

## 2017-10-27 DIAGNOSIS — I700 Atherosclerosis of aorta: Secondary | ICD-10-CM | POA: Diagnosis not present

## 2017-10-27 DIAGNOSIS — E6609 Other obesity due to excess calories: Secondary | ICD-10-CM | POA: Diagnosis not present

## 2017-10-27 DIAGNOSIS — Z79899 Other long term (current) drug therapy: Secondary | ICD-10-CM | POA: Diagnosis not present

## 2017-10-27 DIAGNOSIS — Z0000 Encounter for general adult medical examination without abnormal findings: Secondary | ICD-10-CM | POA: Diagnosis not present

## 2017-10-27 DIAGNOSIS — M2560 Stiffness of unspecified joint, not elsewhere classified: Secondary | ICD-10-CM | POA: Diagnosis not present

## 2017-11-11 DIAGNOSIS — H5203 Hypermetropia, bilateral: Secondary | ICD-10-CM | POA: Diagnosis not present

## 2017-11-11 DIAGNOSIS — H524 Presbyopia: Secondary | ICD-10-CM | POA: Diagnosis not present

## 2017-11-11 DIAGNOSIS — H52223 Regular astigmatism, bilateral: Secondary | ICD-10-CM | POA: Diagnosis not present

## 2017-11-12 ENCOUNTER — Encounter: Payer: Self-pay | Admitting: Gastroenterology

## 2017-11-17 DIAGNOSIS — M2550 Pain in unspecified joint: Secondary | ICD-10-CM | POA: Diagnosis not present

## 2017-11-25 DIAGNOSIS — M9902 Segmental and somatic dysfunction of thoracic region: Secondary | ICD-10-CM | POA: Diagnosis not present

## 2017-11-25 DIAGNOSIS — M546 Pain in thoracic spine: Secondary | ICD-10-CM | POA: Diagnosis not present

## 2017-11-25 DIAGNOSIS — M542 Cervicalgia: Secondary | ICD-10-CM | POA: Diagnosis not present

## 2017-11-25 DIAGNOSIS — M9901 Segmental and somatic dysfunction of cervical region: Secondary | ICD-10-CM | POA: Diagnosis not present

## 2017-12-30 ENCOUNTER — Encounter: Payer: Self-pay | Admitting: Gastroenterology

## 2017-12-30 ENCOUNTER — Ambulatory Visit (AMBULATORY_SURGERY_CENTER): Payer: Self-pay

## 2017-12-30 VITALS — Ht 68.0 in | Wt 238.0 lb

## 2017-12-30 DIAGNOSIS — Z86010 Personal history of colonic polyps: Secondary | ICD-10-CM

## 2017-12-30 MED ORDER — NA SULFATE-K SULFATE-MG SULF 17.5-3.13-1.6 GM/177ML PO SOLN: 1.0000 | Freq: Once | ORAL | 0 refills | Status: AC

## 2017-12-30 NOTE — Progress Notes (Signed)
Denies allergies to eggs or soy products. Denies complication of anesthesia or sedation. Denies use of weight loss medication. Denies use of O2.   Emmi instructions declined.  

## 2018-01-13 ENCOUNTER — Encounter: Payer: Self-pay | Admitting: Gastroenterology

## 2018-01-13 ENCOUNTER — Ambulatory Visit (AMBULATORY_SURGERY_CENTER): Payer: 59 | Admitting: Gastroenterology

## 2018-01-13 VITALS — BP 152/90 | HR 65 | Temp 99.3°F | Resp 23 | Ht 68.0 in | Wt 238.0 lb

## 2018-01-13 DIAGNOSIS — Z86010 Personal history of colonic polyps: Secondary | ICD-10-CM | POA: Diagnosis not present

## 2018-01-13 DIAGNOSIS — D124 Benign neoplasm of descending colon: Secondary | ICD-10-CM | POA: Diagnosis not present

## 2018-01-13 DIAGNOSIS — D125 Benign neoplasm of sigmoid colon: Secondary | ICD-10-CM

## 2018-01-13 DIAGNOSIS — D127 Benign neoplasm of rectosigmoid junction: Secondary | ICD-10-CM | POA: Diagnosis not present

## 2018-01-13 DIAGNOSIS — D123 Benign neoplasm of transverse colon: Secondary | ICD-10-CM

## 2018-01-13 DIAGNOSIS — Z1211 Encounter for screening for malignant neoplasm of colon: Secondary | ICD-10-CM | POA: Diagnosis not present

## 2018-01-13 MED ORDER — SODIUM CHLORIDE 0.9 % IV SOLN: 500.0000 mL | Freq: Once | INTRAVENOUS | Status: DC

## 2018-01-13 NOTE — Progress Notes (Signed)
Alert and oriented x 3, pleased with MAC, report to RN

## 2018-01-13 NOTE — Op Note (Signed)
[place] Patient Name: Javier Vega Procedure Date: 01/13/2018 10:21 AM MRN: [id number] Endoscopist: Mauri Pole , MD Age: 57 Referring MD:  Date of Birth: Feb 22, 1961 Gender: Male Account #: [id number] Procedure:                Colonoscopy Indications:              High risk colon cancer surveillance: Personal                            history of colonic polyps, High risk colon cancer                            surveillance: Personal history of multiple (3 or                            more) adenomas, High risk colon cancer                            surveillance: Personal history of adenoma (10 mm or                            greater in size) Medicines:                Monitored Anesthesia Care Procedure:                Pre-Anesthesia Assessment:                           - Prior to the procedure, a History and Physical                            was performed, and patient medications and                            allergies were reviewed. The patient's tolerance of                            previous anesthesia was also reviewed. The risks                            and benefits of the procedure and the sedation                            options and risks were discussed with the patient.                            All questions were answered, and informed consent                            was obtained. Prior Anticoagulants: The patient has                            taken no previous anticoagulant or antiplatelet  agents. ASA Grade Assessment: II - A patient with                            mild systemic disease. After reviewing the risks                            and benefits, the patient was deemed in                            satisfactory condition to undergo the procedure.                           After obtaining informed consent, the colonoscope                            was passed under direct vision. Throughout the                     procedure, the patient's blood pressure, pulse, and                            oxygen saturations were monitored continuously. The                            Model PCF-[id number]) scope was introduced                            through the anus and advanced to the the cecum,                            identified by appendiceal orifice and ileocecal                            valve. The colonoscopy was performed without                            difficulty. The patient tolerated the procedure                            well. The quality of the bowel preparation was                            excellent. The ileocecal valve, appendiceal                            orifice, and rectum were photographed. Scope In: 10:38:05 AM Scope Out: 11:59:03 AM Scope Withdrawal Time: 1 hour 17 minutes 28 seconds  Total Procedure Duration: 1 hour 20 minutes 58 seconds  Findings:                 The perianal and digital rectal examinations were                            normal.  A 2 mm polyp was found in the transverse colon. The                            polyp was sessile. The polyp was removed with a                            cold biopsy forceps. Resection and retrieval were                            complete.                           Two sessile polyps were found in the recto-sigmoid                            colon, sigmoid colon, descending colon and                            transverse colon. The polyps were 5 to 8 mm in                            size. These polyps were removed with a cold snare.                            Resection and retrieval were complete.                           Two sessile polyps were found in the recto-sigmoid                            colon and sigmoid colon. The polyps were 8 to 11 mm                            in size. These polyps were removed with a hot                            snare. Resection and retrieval  were complete.                           Multiple small and large-mouthed diverticula were                            found in the sigmoid colon and descending colon.                           Non-bleeding internal hemorrhoids were found during                            retroflexion. The hemorrhoids were medium-sized. Complications:            No immediate complications. Estimated Blood Loss:     Estimated blood loss was minimal. Impression:               - One 2 mm  polyp in the transverse colon, removed                            with a cold biopsy forceps. Resected and retrieved.                           - Two 5 to 8 mm polyps at the recto-sigmoid colon,                            in the sigmoid colon, in the descending colon and                            in the transverse colon, removed with a cold snare.                            Resected and retrieved.                           - Two 8 to 11 mm polyps at the recto-sigmoid colon                            and in the sigmoid colon, removed with a hot snare.                            Resected and retrieved.                           - Diverticulosis in the sigmoid colon and in the                            descending colon.                           - Non-bleeding internal hemorrhoids. Recommendation:           - Patient has a contact number available for                            emergencies. The signs and symptoms of potential                            delayed complications were discussed with the                            patient. Return to normal activities tomorrow.                            Written discharge instructions were provided to the                            patient.                           - Resume previous diet.                           -  Continue present medications.                           - Await pathology results.                           - No high dose aspirin, ibuprofen, naproxen, or                             other non-steroidal anti-inflammatory drugs for 2                            weeks.                           - Repeat colonoscopy in 3 years for surveillance                            based on pathology results. Mauri Pole, MD 01/13/2018 11:10:07 AM This report has been signed electronically.

## 2018-01-13 NOTE — Progress Notes (Signed)
Pt's states no medical or surgical changes since previsit or office visit. 

## 2018-01-13 NOTE — Patient Instructions (Signed)
Discharge instructions given. Handouts on polyps,diverticulosis and hemorrhoids. No high dose aspirin,ibuprofen,naproxen, or other non-steroidal anti-inflammatory drugs for 2 weeks. Resume previous medications. YOU HAD AN ENDOSCOPIC PROCEDURE TODAY AT [facility]:   Refer to the procedure report that was given to you for any specific questions about what was found during the examination.  If the procedure report does not answer your questions, please call your gastroenterologist to clarify.  If you requested that your care partner not be given the details of your procedure findings, then the procedure report has been included in a sealed envelope for you to review at your convenience later.  YOU SHOULD EXPECT: Some feelings of bloating in the abdomen. Passage of more gas than usual.  Walking can help get rid of the air that was put into your GI tract during the procedure and reduce the bloating. If you had a lower endoscopy (such as a colonoscopy or flexible sigmoidoscopy) you may notice spotting of blood in your stool or on the toilet paper. If you underwent a bowel prep for your procedure, you may not have a normal bowel movement for a few days.  Please Note:  You might notice some irritation and congestion in your nose or some drainage.  This is from the oxygen used during your procedure.  There is no need for concern and it should clear up in a day or so.  SYMPTOMS TO REPORT IMMEDIATELY:   Following lower endoscopy (colonoscopy or flexible sigmoidoscopy):  Excessive amounts of blood in the stool  Significant tenderness or worsening of abdominal pains  Swelling of the abdomen that is new, acute  Fever of 100F or higher   For urgent or emergent issues, a gastroenterologist can be reached at any hour by calling [phone].   DIET:  We do recommend a small meal at first, but then you may proceed to your regular diet.  Drink plenty of fluids but you should avoid alcoholic  beverages for 24 hours.  ACTIVITY:  You should plan to take it easy for the rest of today and you should NOT DRIVE or use heavy machinery until tomorrow (because of the sedation medicines used during the test).    FOLLOW UP: Our staff will call the number listed on your records the next business day following your procedure to check on you and address any questions or concerns that you may have regarding the information given to you following your procedure. If we do not reach you, we will leave a message.  However, if you are feeling well and you are not experiencing any problems, there is no need to return our call.  We will assume that you have returned to your regular daily activities without incident.  If any biopsies were taken you will be contacted by phone or by letter within the next 1-3 weeks.  Please call us at [phone] if you have not heard about the biopsies in 3 weeks.    SIGNATURES/CONFIDENTIALITY: You and/or your care partner have signed paperwork which will be entered into your electronic medical record.  These signatures attest to the fact that that the information above on your After Visit Summary has been reviewed and is understood.  Full responsibility of the confidentiality of this discharge information lies with you and/or your care-partner.

## 2018-01-13 NOTE — Progress Notes (Signed)
Called to room to assist during endoscopic procedure.  Patient ID and intended procedure confirmed with present staff. Received instructions for my participation in the procedure from the performing physician.  

## 2018-01-14 ENCOUNTER — Telehealth: Payer: Self-pay

## 2018-01-14 NOTE — Telephone Encounter (Signed)
  Follow up Call-  Call back number 01/13/2018  Post procedure Call Back phone  # [phone]  Permission to leave phone message Yes  Some recent data might be hidden     Patient questions:  Do you have a fever, pain , or abdominal swelling? No. Pain Score  0 *  Have you tolerated food without any problems? Yes.    Have you been able to return to your normal activities? Yes.    Do you have any questions about your discharge instructions: Diet   No. Medications  No. Follow up visit  No.  Do you have questions or concerns about your Care? No.  Actions: * If pain score is 4 or above: No action needed, pain <4.

## 2018-01-16 ENCOUNTER — Encounter: Payer: Self-pay | Admitting: Gastroenterology

## 2018-02-03 DIAGNOSIS — M25561 Pain in right knee: Secondary | ICD-10-CM | POA: Diagnosis not present

## 2018-02-03 DIAGNOSIS — Z9889 Other specified postprocedural states: Secondary | ICD-10-CM | POA: Diagnosis not present

## 2018-03-02 DIAGNOSIS — M9904 Segmental and somatic dysfunction of sacral region: Secondary | ICD-10-CM | POA: Diagnosis not present

## 2018-03-02 DIAGNOSIS — M542 Cervicalgia: Secondary | ICD-10-CM | POA: Diagnosis not present

## 2018-03-02 DIAGNOSIS — M9903 Segmental and somatic dysfunction of lumbar region: Secondary | ICD-10-CM | POA: Diagnosis not present

## 2018-03-02 DIAGNOSIS — M545 Low back pain: Secondary | ICD-10-CM | POA: Diagnosis not present

## 2018-05-15 DIAGNOSIS — R05 Cough: Secondary | ICD-10-CM | POA: Diagnosis not present

## 2018-05-15 DIAGNOSIS — R0981 Nasal congestion: Secondary | ICD-10-CM | POA: Diagnosis not present

## 2018-05-15 DIAGNOSIS — J018 Other acute sinusitis: Secondary | ICD-10-CM | POA: Diagnosis not present

## 2018-06-23 DIAGNOSIS — M25562 Pain in left knee: Secondary | ICD-10-CM | POA: Diagnosis not present

## 2018-07-08 DIAGNOSIS — M25562 Pain in left knee: Secondary | ICD-10-CM | POA: Diagnosis not present

## 2018-07-16 DIAGNOSIS — M25562 Pain in left knee: Secondary | ICD-10-CM | POA: Diagnosis not present

## 2018-07-30 DIAGNOSIS — S83231D Complex tear of medial meniscus, current injury, right knee, subsequent encounter: Secondary | ICD-10-CM | POA: Diagnosis not present

## 2018-07-30 DIAGNOSIS — M84362A Stress fracture, left tibia, initial encounter for fracture: Secondary | ICD-10-CM | POA: Diagnosis not present

## 2018-07-30 DIAGNOSIS — M25562 Pain in left knee: Secondary | ICD-10-CM | POA: Diagnosis not present

## 2018-11-13 DIAGNOSIS — H5203 Hypermetropia, bilateral: Secondary | ICD-10-CM | POA: Diagnosis not present

## 2018-11-13 DIAGNOSIS — H524 Presbyopia: Secondary | ICD-10-CM | POA: Diagnosis not present

## 2018-11-13 DIAGNOSIS — H52223 Regular astigmatism, bilateral: Secondary | ICD-10-CM | POA: Diagnosis not present

## 2018-11-16 ENCOUNTER — Other Ambulatory Visit: Payer: Self-pay | Admitting: Geriatric Medicine

## 2018-11-16 ENCOUNTER — Ambulatory Visit
Admission: RE | Admit: 2018-11-16 | Discharge: 2018-11-16 | Disposition: A | Discharge status: Home / Self Care | Payer: 59 | Source: Ambulatory Visit | Attending: Geriatric Medicine | Admitting: Geriatric Medicine

## 2018-11-16 DIAGNOSIS — E7800 Pure hypercholesterolemia, unspecified: Secondary | ICD-10-CM | POA: Diagnosis not present

## 2018-11-16 DIAGNOSIS — F17210 Nicotine dependence, cigarettes, uncomplicated: Secondary | ICD-10-CM | POA: Diagnosis not present

## 2018-11-16 DIAGNOSIS — Z6835 Body mass index (BMI) 35.0-35.9, adult: Secondary | ICD-10-CM | POA: Diagnosis not present

## 2018-11-16 DIAGNOSIS — I700 Atherosclerosis of aorta: Secondary | ICD-10-CM | POA: Diagnosis not present

## 2018-11-16 DIAGNOSIS — Z23 Encounter for immunization: Secondary | ICD-10-CM | POA: Diagnosis not present

## 2018-11-16 DIAGNOSIS — Z0000 Encounter for general adult medical examination without abnormal findings: Secondary | ICD-10-CM

## 2018-11-16 DIAGNOSIS — E782 Mixed hyperlipidemia: Secondary | ICD-10-CM | POA: Diagnosis not present

## 2018-11-16 DIAGNOSIS — R7611 Nonspecific reaction to tuberculin skin test without active tuberculosis: Secondary | ICD-10-CM | POA: Diagnosis not present

## 2018-11-16 DIAGNOSIS — B351 Tinea unguium: Secondary | ICD-10-CM | POA: Diagnosis not present

## 2018-11-16 DIAGNOSIS — Z79899 Other long term (current) drug therapy: Secondary | ICD-10-CM | POA: Diagnosis not present

## 2018-11-16 DIAGNOSIS — Z125 Encounter for screening for malignant neoplasm of prostate: Secondary | ICD-10-CM | POA: Diagnosis not present

## 2018-11-30 ENCOUNTER — Other Ambulatory Visit: Payer: Self-pay

## 2018-11-30 ENCOUNTER — Emergency Department (HOSPITAL_COMMUNITY)
Admission: EM | Admit: 2018-11-30 | Discharge: 2018-12-01 | Disposition: A | Discharge status: Home / Self Care | Payer: 59 | Attending: Emergency Medicine | Admitting: Emergency Medicine

## 2018-11-30 ENCOUNTER — Encounter (HOSPITAL_COMMUNITY): Payer: Self-pay

## 2018-11-30 ENCOUNTER — Emergency Department (HOSPITAL_COMMUNITY): Payer: 59

## 2018-11-30 DIAGNOSIS — K8020 Calculus of gallbladder without cholecystitis without obstruction: Secondary | ICD-10-CM | POA: Diagnosis not present

## 2018-11-30 DIAGNOSIS — N132 Hydronephrosis with renal and ureteral calculous obstruction: Secondary | ICD-10-CM | POA: Insufficient documentation

## 2018-11-30 DIAGNOSIS — I10 Essential (primary) hypertension: Secondary | ICD-10-CM | POA: Insufficient documentation

## 2018-11-30 DIAGNOSIS — K760 Fatty (change of) liver, not elsewhere classified: Secondary | ICD-10-CM | POA: Diagnosis not present

## 2018-11-30 DIAGNOSIS — F17290 Nicotine dependence, other tobacco product, uncomplicated: Secondary | ICD-10-CM | POA: Diagnosis not present

## 2018-11-30 DIAGNOSIS — Z79899 Other long term (current) drug therapy: Secondary | ICD-10-CM | POA: Diagnosis not present

## 2018-11-30 DIAGNOSIS — R1031 Right lower quadrant pain: Secondary | ICD-10-CM | POA: Diagnosis present

## 2018-11-30 DIAGNOSIS — N201 Calculus of ureter: Secondary | ICD-10-CM

## 2018-11-30 LAB — URINALYSIS, ROUTINE W REFLEX MICROSCOPIC
Bilirubin Urine: NEGATIVE
Glucose, UA: NEGATIVE mg/dL
Ketones, ur: NEGATIVE mg/dL
Leukocytes,Ua: NEGATIVE
Nitrite: NEGATIVE
Protein, ur: 100 mg/dL — AB
RBC / HPF: >50 RBC/hpf — ABNORMAL HIGH (ref 0–5)
Specific Gravity, Urine: 1.019 (ref 1.005–1.030)
pH: 5 (ref 5.0–8.0)

## 2018-11-30 LAB — CBC
HCT: 48.4 % (ref 39.0–52.0)
Hemoglobin: 16.7 g/dL (ref 13.0–17.0)
MCH: 32 pg (ref 26.0–34.0)
MCHC: 34.5 g/dL (ref 30.0–36.0)
MCV: 92.7 fL (ref 80.0–100.0)
Platelets: 245 10*3/uL (ref 150–400)
RBC: 5.22 MIL/uL (ref 4.22–5.81)
RDW: 12.6 % (ref 11.5–15.5)
WBC: 16.5 10*3/uL — ABNORMAL HIGH (ref 4.0–10.5)
nRBC: 0 % (ref 0.0–0.2)

## 2018-11-30 LAB — BASIC METABOLIC PANEL
Anion gap: 11 (ref 5–15)
BUN: 21 mg/dL — ABNORMAL HIGH (ref 6–20)
CO2: 23 mmol/L (ref 22–32)
Calcium: 9.9 mg/dL (ref 8.9–10.3)
Chloride: 104 mmol/L (ref 98–111)
Creatinine, Ser: 1.08 mg/dL (ref 0.61–1.24)
GFR calc Af Amer: >60 mL/min (ref >60)
GFR calc non Af Amer: >60 mL/min (ref >60)
Glucose, Bld: 97 mg/dL (ref 70–99)
Potassium: 3.5 mmol/L (ref 3.5–5.1)
Sodium: 138 mmol/L (ref 135–145)

## 2018-11-30 MED ORDER — SODIUM CHLORIDE 0.9 % IV BOLUS
1000.0000 mL | Freq: Once | INTRAVENOUS | Status: AC
  Administered 2018-11-30: 1000 mL via INTRAVENOUS

## 2018-11-30 MED ORDER — KETOROLAC TROMETHAMINE 30 MG/ML IJ SOLN
30.0000 mg | Freq: Once | INTRAMUSCULAR | Status: AC
  Administered 2018-11-30: 30 mg via INTRAVENOUS
  Filled 2018-11-30: qty 1

## 2018-11-30 NOTE — ED Triage Notes (Signed)
Pt arrives POV for eval of RLQ abd pain w/ radiation to R flank, pt reports onset this AM. Pt also reports hematuria. Denies know hx of kidney stones, denies dysuria,

## 2018-11-30 NOTE — ED Provider Notes (Signed)
[facility] EMERGENCY DEPARTMENT Provider Note   CSN: [id number] Arrival date & time: 11/30/18  1530     History   Chief Complaint Chief Complaint  Patient presents with   Abdominal Pain   Flank Pain    HPI Javier Vega is a 58 y.o. male.     HPI Patient presents to the emergency department with right flank pain that started yesterday.  Patient states that he did not take any medications prior to arrival for his symptoms.  The patient states that he has never had any symptoms similar to this in the past.  Patient states that he thinks that his urine has been darker.  The patient states that the pain radiates to his back.  The patient denies chest pain, shortness of breath, headache,blurred vision, neck pain, fever, cough, weakness, numbness, dizziness, anorexia, edema, nausea, vomiting, diarrhea, rash, back pain, dysuria, hematemesis, bloody stool, near syncope, or syncope. Past Medical History:  Diagnosis Date   Allergy    SEASONAL   Anal fissure    Arthritis    GERD (gastroesophageal reflux disease)    Hyperlipidemia    Hypertension    Sleep apnea    Vitamin D deficiency     Patient Active Problem List   Diagnosis Date Noted   Right knee pain 06/17/2017   Atherosclerosis of aorta ([place]) 05/12/2017   Right maxillary sinusitis, chronic 03/13/2015   OSA / Intolerant to CPAP 08/18/2014   Obesity 02/15/2014   Tobacco use disorder 02/15/2014   Medication management 10/08/2013   Other abnormal glucose 05/18/2013   Essential hypertension 05/18/2013   Hyperlipidemia    Vitamin D deficiency     Past Surgical History:  Procedure Laterality Date   COLONOSCOPY     CYSTO  2002   KNEE SURGERY     POLYPECTOMY     WISDOM TOOTH EXTRACTION          Home Medications    Prior to Admission medications   Medication Sig Start Date End Date Taking? Authorizing Provider  HYDROcodone-acetaminophen (NORCO/VICODIN) 5-325 MG tablet  Take 1 tablet by mouth every 6 (six) hours. 09/24/18  Yes [provider]  Omega-3 Fatty Acids (FISH OIL PO) Take 1 capsule by mouth daily.    Yes [provider]  predniSONE (DELTASONE) 20 MG tablet 2 tablets daily for 3 days, 1 tablet daily for 4 days. Patient taking differently: Take 10 mg by mouth as needed (for inflamation).  05/08/17  Yes Vicie Mutters, PA-C  PRESCRIPTION MEDICATION Take 1 tablet by mouth every evening. Cholesterol   Yes [provider]  atorvastatin (LIPITOR) 80 MG tablet TAKE 1/2 TO 1 TABLET BY MOUTH EACH DAY AS DIRECTED FOR CHOLESTEROL Patient not taking: Reported on 11/30/2018 06/17/17   Liane Comber, NP  fluticasone (FLONASE) 50 MCG/ACT nasal spray Place 2 sprays into both nostrils at bedtime. Patient not taking: Reported on 12/30/2017 07/03/16   Rolene Course, PA-C  levofloxacin (LEVAQUIN) 500 MG tablet Take 1 tablet (500 mg total) by mouth daily. Patient not taking: Reported on 06/17/2017 05/08/17   Vicie Mutters, PA-C  meclizine (ANTIVERT) 25 MG tablet Take 1 tablet (25 mg total) by mouth 3 (three) times daily as needed for dizziness. Patient not taking: Reported on 11/30/2018 07/03/16   Rolene Course, PA-C    Family History Family History  Adopted: Yes  Problem Relation Age of Onset   Colon cancer Neg Hx        Pt was adopted, does not  know Fx   Colon polyps Neg Hx     Social History Social History   Tobacco Use   Smoking status: Current Every Day Smoker    Packs/day: 0.25    Years: 36.00    Pack years: 9.00    Types: E-cigarettes   Smokeless tobacco: Never Used   Tobacco comment: smokes e cigs now-smokes about 6 cigarettes daily.  Substance Use Topics   Alcohol use: Yes    Alcohol/week: 0.0 standard drinks    Comment: Occassionally   Drug use: No     Allergies   Crestor [rosuvastatin] and Sulfa antibiotics   Review of Systems Review of Systems All other systems negative except as documented in the HPI.  All pertinent positives and negatives as reviewed in the HPI. Physical Exam Updated Vital Signs BP 133/87 (BP Location: Left Arm)    Pulse 69    Temp 98.3 F (36.8 C) (Oral)    Resp 14    Ht 5\' 9"  (1.753 m)    Wt 108.9 kg    SpO2 98%    BMI 35.44 kg/m   Physical Exam Vitals signs and nursing note reviewed.  Constitutional:      General: He is not in acute distress.    Appearance: He is well-developed.  HENT:     Head: Normocephalic and atraumatic.  Eyes:     Pupils: Pupils are equal, round, and reactive to light.  Neck:     Musculoskeletal: Normal range of motion and neck supple.  Cardiovascular:     Rate and Rhythm: Normal rate and regular rhythm.     Heart sounds: Normal heart sounds. No murmur. No friction rub. No gallop.   Pulmonary:     Effort: Pulmonary effort is normal. No respiratory distress.     Breath sounds: Normal breath sounds. No wheezing.  Abdominal:     General: Bowel sounds are normal. There is no distension.     Palpations: Abdomen is soft.     Tenderness: There is no abdominal tenderness.  Skin:    General: Skin is warm and dry.     Capillary Refill: Capillary refill takes less than 2 seconds.     Findings: No erythema or rash.  Neurological:     Mental Status: He is alert and oriented to person, place, and time.     Motor: No abnormal muscle tone.     Coordination: Coordination normal.  Psychiatric:        Behavior: Behavior normal.      ED Treatments / Results  Labs (all labs ordered are listed, but only abnormal results are displayed) Labs Reviewed  URINALYSIS, ROUTINE W REFLEX MICROSCOPIC - Abnormal; Notable for the following components:      Result Value   Color, Urine AMBER (*)    APPearance CLOUDY (*)    Hgb urine dipstick LARGE (*)    Protein, ur 100 (*)    RBC / HPF >50 (*)    Bacteria, UA RARE (*)    All other components within normal limits  BASIC METABOLIC PANEL - Abnormal; Notable for the following components:   BUN 21 (*)     All other components within normal limits  CBC - Abnormal; Notable for the following components:   WBC 16.5 (*)    All other components within normal limits  URINE CULTURE    EKG None  Radiology Ct Renal Stone Study  Result Date: 11/30/2018 CLINICAL DATA:  Right flank pain radiating to right lower quadrant.  EXAM: CT ABDOMEN AND PELVIS WITHOUT CONTRAST TECHNIQUE: Multidetector CT imaging of the abdomen and pelvis was performed following the standard protocol without IV contrast. COMPARISON:  CT dated December 15, 2013 FINDINGS: Lower chest: There is a cluster of small nodules at the left lung base (axial series 5, image 8).The heart size is normal. Hepatobiliary: There is decreased hepatic attenuation suggestive of hepatic steatosis. Cholelithiasis without acute inflammation.There is no biliary ductal dilation. Pancreas: Normal contours without ductal dilatation. No peripancreatic fluid collection. Spleen: No splenic laceration or hematoma. Adrenals/Urinary Tract: --Adrenal glands: No adrenal hemorrhage. --Right kidney/ureter: There is mild right-sided hydroureteronephrosis secondary to an obstructing 4 mm stone in the proximal right ureter (axial series 3, image 42). There are no residual radiopaque stones in the right kidney. --Left kidney/ureter: There is a punctate stone in the upper pole the left kidney. No left-sided hydronephrosis. --Urinary bladder: The urinary bladder is decompressed which limits evaluation Stomach/Bowel: --Stomach/Duodenum: No hiatal hernia or other gastric abnormality. Normal duodenal course and caliber. --Small bowel: No dilatation or inflammation. --Colon: No focal abnormality. --Appendix: Normal. Vascular/Lymphatic: Atherosclerotic calcification is present within the non-aneurysmal abdominal aorta, without hemodynamically significant stenosis. --No retroperitoneal lymphadenopathy. --No mesenteric lymphadenopathy. --No pelvic or inguinal lymphadenopathy. Reproductive:  Unremarkable Other: No ascites or free air. The abdominal wall is normal. Musculoskeletal. No acute displaced fractures. IMPRESSION: 1. Mild right-sided hydroureteronephrosis secondary to an obstructing 4 mm stone in the mid right ureter. 2. Punctate nonobstructing stone in the upper pole the left kidney. 3. There is cholelithiasis without secondary signs of acute cholecystitis. 4. There is a cluster of nodules at the left lung base favored to represent infectious bronchiolitis. 5. Hepatic steatosis. 6. Aortic Atherosclerosis (ICD10-I70.0). Electronically Signed   By: Constance Holster M.D.   On: 11/30/2018 23:05    Procedures Procedures (including critical care time)  Medications Ordered in ED Medications  ketorolac (TORADOL) 30 MG/ML injection 30 mg (has no administration in time range)  sodium chloride 0.9 % bolus 1,000 mL (has no administration in time range)     Initial Impression / Assessment and Plan / ED Course  I have reviewed the triage vital signs and the nursing notes.  Pertinent labs & imaging results that were available during my care of the patient were reviewed by me and considered in my medical decision making (see chart for details).        The patient has a right mid ureteral stone.  Patient be given IV fluids and medications.  Patient has been otherwise stable here in the emergency department.. Patient will be referred to urology.  Final Clinical Impressions(s) / ED Diagnoses   Final diagnoses:  None    ED Discharge Orders    None       Dalia Heading, PA-C 11/30/18 2340    Dorie Rank, MD 12/02/18 [phone]

## 2018-12-01 ENCOUNTER — Other Ambulatory Visit: Payer: Self-pay | Admitting: Urology

## 2018-12-01 ENCOUNTER — Encounter (HOSPITAL_COMMUNITY): Payer: Self-pay | Admitting: General Practice

## 2018-12-01 DIAGNOSIS — R3129 Other microscopic hematuria: Secondary | ICD-10-CM | POA: Diagnosis not present

## 2018-12-01 DIAGNOSIS — N201 Calculus of ureter: Secondary | ICD-10-CM | POA: Diagnosis not present

## 2018-12-01 LAB — URINE CULTURE: Culture: NO GROWTH

## 2018-12-01 MED ORDER — TAMSULOSIN HCL 0.4 MG PO CAPS: 0.4000 mg | ORAL_CAPSULE | Freq: Every day | ORAL | 0 refills | Status: DC

## 2018-12-01 MED ORDER — HYDROCODONE-ACETAMINOPHEN 5-325 MG PO TABS: 1.0000 | ORAL_TABLET | Freq: Four times a day (QID) | ORAL | 0 refills | Status: DC | PRN

## 2018-12-01 NOTE — Discharge Instructions (Signed)
Return here as needed.  Follow up with the urologist provided.  Increase your fluid intake °

## 2018-12-03 ENCOUNTER — Ambulatory Visit (HOSPITAL_COMMUNITY)
Admission: RE | Admit: 2018-12-03 | Discharge: 2018-12-03 | Disposition: A | Discharge status: Home / Self Care | Payer: 59 | Source: Other Acute Inpatient Hospital | Attending: Urology | Admitting: Urology

## 2018-12-03 ENCOUNTER — Encounter (HOSPITAL_COMMUNITY): Payer: Self-pay | Admitting: *Deleted

## 2018-12-03 ENCOUNTER — Other Ambulatory Visit: Payer: Self-pay

## 2018-12-03 ENCOUNTER — Encounter (HOSPITAL_COMMUNITY)
Admission: RE | Disposition: A | Discharge status: Home / Self Care | Payer: Self-pay | Source: Other Acute Inpatient Hospital | Attending: Urology

## 2018-12-03 ENCOUNTER — Ambulatory Visit (HOSPITAL_COMMUNITY): Payer: 59

## 2018-12-03 DIAGNOSIS — M1990 Unspecified osteoarthritis, unspecified site: Secondary | ICD-10-CM | POA: Insufficient documentation

## 2018-12-03 DIAGNOSIS — Z86010 Personal history of colonic polyps: Secondary | ICD-10-CM | POA: Diagnosis not present

## 2018-12-03 DIAGNOSIS — K219 Gastro-esophageal reflux disease without esophagitis: Secondary | ICD-10-CM | POA: Insufficient documentation

## 2018-12-03 DIAGNOSIS — Z882 Allergy status to sulfonamides status: Secondary | ICD-10-CM | POA: Insufficient documentation

## 2018-12-03 DIAGNOSIS — E785 Hyperlipidemia, unspecified: Secondary | ICD-10-CM | POA: Insufficient documentation

## 2018-12-03 DIAGNOSIS — G4730 Sleep apnea, unspecified: Secondary | ICD-10-CM | POA: Diagnosis not present

## 2018-12-03 DIAGNOSIS — Z6835 Body mass index (BMI) 35.0-35.9, adult: Secondary | ICD-10-CM | POA: Diagnosis not present

## 2018-12-03 DIAGNOSIS — F17290 Nicotine dependence, other tobacco product, uncomplicated: Secondary | ICD-10-CM | POA: Insufficient documentation

## 2018-12-03 DIAGNOSIS — E669 Obesity, unspecified: Secondary | ICD-10-CM | POA: Diagnosis not present

## 2018-12-03 DIAGNOSIS — N201 Calculus of ureter: Secondary | ICD-10-CM | POA: Insufficient documentation

## 2018-12-03 DIAGNOSIS — Z87442 Personal history of urinary calculi: Secondary | ICD-10-CM | POA: Insufficient documentation

## 2018-12-03 DIAGNOSIS — I10 Essential (primary) hypertension: Secondary | ICD-10-CM | POA: Insufficient documentation

## 2018-12-03 DIAGNOSIS — E559 Vitamin D deficiency, unspecified: Secondary | ICD-10-CM | POA: Diagnosis not present

## 2018-12-03 HISTORY — DX: Personal history of urinary calculi: Z87.442

## 2018-12-03 HISTORY — PX: EXTRACORPOREAL SHOCK WAVE LITHOTRIPSY: SHX1557

## 2018-12-03 SURGERY — LITHOTRIPSY, ESWL
Anesthesia: LOCAL | Laterality: Right

## 2018-12-03 MED ORDER — SODIUM CHLORIDE 0.9 % IV SOLN
INTRAVENOUS | Status: DC
  Administered 2018-12-03: 11:00:00 via INTRAVENOUS

## 2018-12-03 MED ORDER — CIPROFLOXACIN HCL 500 MG PO TABS
500.0000 mg | ORAL_TABLET | ORAL | Status: AC
  Administered 2018-12-03: 500 mg via ORAL
  Filled 2018-12-03: qty 1

## 2018-12-03 MED ORDER — DIPHENHYDRAMINE HCL 25 MG PO CAPS
25.0000 mg | ORAL_CAPSULE | ORAL | Status: AC
  Administered 2018-12-03: 25 mg via ORAL
  Filled 2018-12-03: qty 1

## 2018-12-03 MED ORDER — DIAZEPAM 5 MG PO TABS
10.0000 mg | ORAL_TABLET | ORAL | Status: AC
  Administered 2018-12-03: 10 mg via ORAL
  Filled 2018-12-03: qty 2

## 2018-12-03 NOTE — Discharge Instructions (Signed)
1 - You may have urinary urgency (bladder spasms), pass small stone fragments, bloody urine on / off for up to 2 weeks. This is normal.  2 - Call MD or go to ER for fever >102, severe pain / nausea / vomiting not relieved by medications, or acute change in medical status

## 2018-12-03 NOTE — H&P (Signed)
Javier Vega is an 58 y.o. male.    Chief Complaint: Pre-Op RIGHT Shockwave Lithotripsy  HPI:   1 - Right Ureteral Stone - 30mm Rt mid stone by ER CT 11/2018 on eval flank pain. Stone is solitary, 16mm, SSD 15cm, 760HU and at level up upper L4 vertebral body. UA withtou infectious parameters. Cr 1.08. Seen on f/u KUB just above L4 TP 12/01/18.    PMH sig for obesity, knee arthritis. 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 His PCP is Lajean Manes MD.   Today " Javier Vega " is seen to proceed with RIGHT shockwave lithotrispy. No interval fevers.    Past Medical History:  Diagnosis Date  . Allergy    SEASONAL  . Anal fissure   . Arthritis   . GERD (gastroesophageal reflux disease)   . History of kidney stones   . Hyperlipidemia   . Hypertension   . Sleep apnea    does not use as CPAP machine  . Vitamin D deficiency     Past Surgical History:  Procedure Laterality Date  . COLONOSCOPY    . CYSTO  2002  . KNEE SURGERY Right   . POLYPECTOMY    . WISDOM TOOTH EXTRACTION      Family History  Adopted: Yes  Problem Relation Age of Onset  . Colon cancer Neg Hx        Pt was adopted, does not know Fx  . Colon polyps Neg Hx    Social History:  reports that he has been smoking e-cigarettes. He has a 9.00 pack-year smoking history. He has never used smokeless tobacco. He reports current alcohol use. He reports that he does not use drugs.  Allergies:  Allergies  Allergen Reactions  . Crestor [Rosuvastatin] Other (See Comments)    unknown  . Sulfa Antibiotics Other (See Comments)    unknown    No medications prior to admission.    No results found for this or any previous visit (from the past 48 hour(s)). No results found.  Review of Systems  Constitutional: Negative for fever.  Genitourinary: Positive for flank pain.  All other systems reviewed and are negative.   There were no vitals taken for this visit. Physical Exam  Constitutional: He appears  well-developed.  Neck: Normal range of motion.  Cardiovascular: Normal rate.  Respiratory: Effort normal.  GI:  Stable truncal obesity.   Genitourinary:    Genitourinary Comments: Mild Rt CVAT at present.    Musculoskeletal: Normal range of motion.  Neurological: He is alert.  Skin: Skin is warm.     Assessment/Plan  1 - Right Ureteral Stone - proceed as planned with RIGHT shockwave lithotripsy. Risks, benefits, alternatives, expected peri-treatment course discussed previously and reiterated today.   Alexis Frock, MD 12/03/2018, 7:23 AM

## 2018-12-03 NOTE — Brief Op Note (Signed)
12/03/2018  2:03 PM  PATIENT:  Javier Vega  58 y.o. male  PRE-OPERATIVE DIAGNOSIS:  RIGHT URETERAL STONE  POST-OPERATIVE DIAGNOSIS:  * No post-op diagnosis entered *  PROCEDURE:  Procedure(s) with comments: EXTRACORPOREAL SHOCK WAVE LITHOTRIPSY (ESWL) (Right) - 75 MINS  SURGEON:  Surgeon(s) and Role:    * Alexis Frock, MD - Primary  PHYSICIAN ASSISTANT:   ASSISTANTS: none   ANESTHESIA:   general  EBL:  minimal   BLOOD ADMINISTERED:none  DRAINS: none   LOCAL MEDICATIONS USED:  NONE  SPECIMEN:  No Specimen  DISPOSITION OF SPECIMEN:  N/A  COUNTS:  YES  TOURNIQUET:  * No tourniquets in log *  DICTATION: .Note written in paper chart  PLAN OF CARE: Discharge to home after PACU  PATIENT DISPOSITION:  Short Stay   Delay start of Pharmacological VTE agent (>24hrs) due to surgical blood loss or risk of bleeding: not applicable

## 2018-12-04 ENCOUNTER — Encounter (HOSPITAL_COMMUNITY): Payer: Self-pay | Admitting: Urology

## 2018-12-21 DIAGNOSIS — R319 Hematuria, unspecified: Secondary | ICD-10-CM | POA: Diagnosis not present

## 2018-12-21 DIAGNOSIS — N201 Calculus of ureter: Secondary | ICD-10-CM | POA: Diagnosis not present

## 2019-01-19 DIAGNOSIS — E7800 Pure hypercholesterolemia, unspecified: Secondary | ICD-10-CM | POA: Diagnosis not present

## 2019-02-16 DIAGNOSIS — Z23 Encounter for immunization: Secondary | ICD-10-CM | POA: Diagnosis not present

## 2019-03-02 DIAGNOSIS — M25561 Pain in right knee: Secondary | ICD-10-CM | POA: Diagnosis not present

## 2019-03-02 DIAGNOSIS — M25562 Pain in left knee: Secondary | ICD-10-CM | POA: Diagnosis not present

## 2019-03-02 DIAGNOSIS — S83231D Complex tear of medial meniscus, current injury, right knee, subsequent encounter: Secondary | ICD-10-CM | POA: Diagnosis not present

## 2019-03-02 DIAGNOSIS — S83281D Other tear of lateral meniscus, current injury, right knee, subsequent encounter: Secondary | ICD-10-CM | POA: Diagnosis not present

## 2019-03-02 DIAGNOSIS — M170 Bilateral primary osteoarthritis of knee: Secondary | ICD-10-CM | POA: Diagnosis not present

## 2019-03-16 DIAGNOSIS — Z79891 Long term (current) use of opiate analgesic: Secondary | ICD-10-CM | POA: Diagnosis not present

## 2019-04-13 DIAGNOSIS — Z79891 Long term (current) use of opiate analgesic: Secondary | ICD-10-CM | POA: Diagnosis not present

## 2019-04-13 DIAGNOSIS — Z79899 Other long term (current) drug therapy: Secondary | ICD-10-CM | POA: Diagnosis not present

## 2019-04-13 DIAGNOSIS — Z5181 Encounter for therapeutic drug level monitoring: Secondary | ICD-10-CM | POA: Diagnosis not present

## 2019-06-28 DIAGNOSIS — L819 Disorder of pigmentation, unspecified: Secondary | ICD-10-CM | POA: Diagnosis not present

## 2019-06-28 DIAGNOSIS — L570 Actinic keratosis: Secondary | ICD-10-CM | POA: Diagnosis not present

## 2019-06-28 DIAGNOSIS — B354 Tinea corporis: Secondary | ICD-10-CM | POA: Diagnosis not present

## 2019-07-08 DIAGNOSIS — Z79891 Long term (current) use of opiate analgesic: Secondary | ICD-10-CM | POA: Diagnosis not present

## 2019-08-10 DIAGNOSIS — J301 Allergic rhinitis due to pollen: Secondary | ICD-10-CM | POA: Diagnosis not present

## 2019-08-10 DIAGNOSIS — I700 Atherosclerosis of aorta: Secondary | ICD-10-CM | POA: Diagnosis not present

## 2019-08-10 DIAGNOSIS — E7800 Pure hypercholesterolemia, unspecified: Secondary | ICD-10-CM | POA: Diagnosis not present

## 2019-08-10 DIAGNOSIS — Z6835 Body mass index (BMI) 35.0-35.9, adult: Secondary | ICD-10-CM | POA: Diagnosis not present

## 2019-09-22 IMAGING — CR DG CHEST 2V
3 series · 3 of 3 positions shown · non-contrast
Comparison: 04/27/2014

CLINICAL DATA: Cough and congestion for 2 weeks.  Smoker.

EXAM:
CHEST  2 VIEW

[chest pa (1 of 2)]
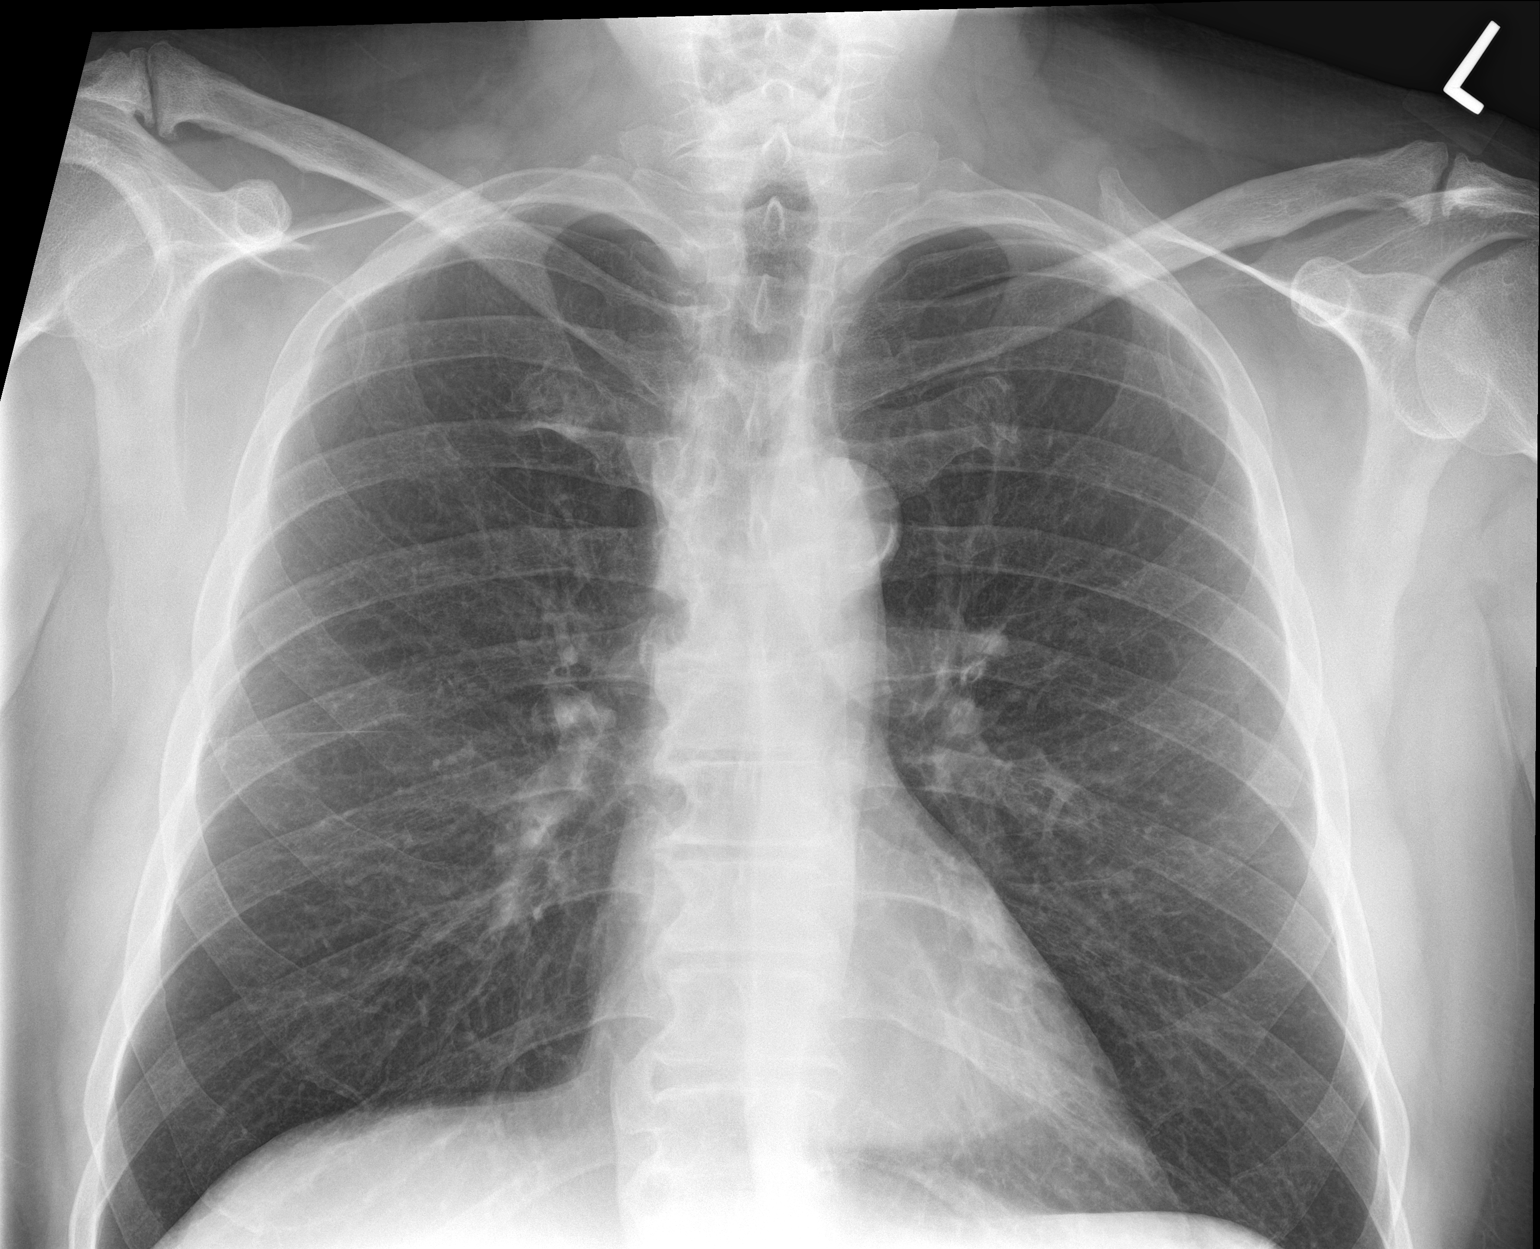

[chest lat]
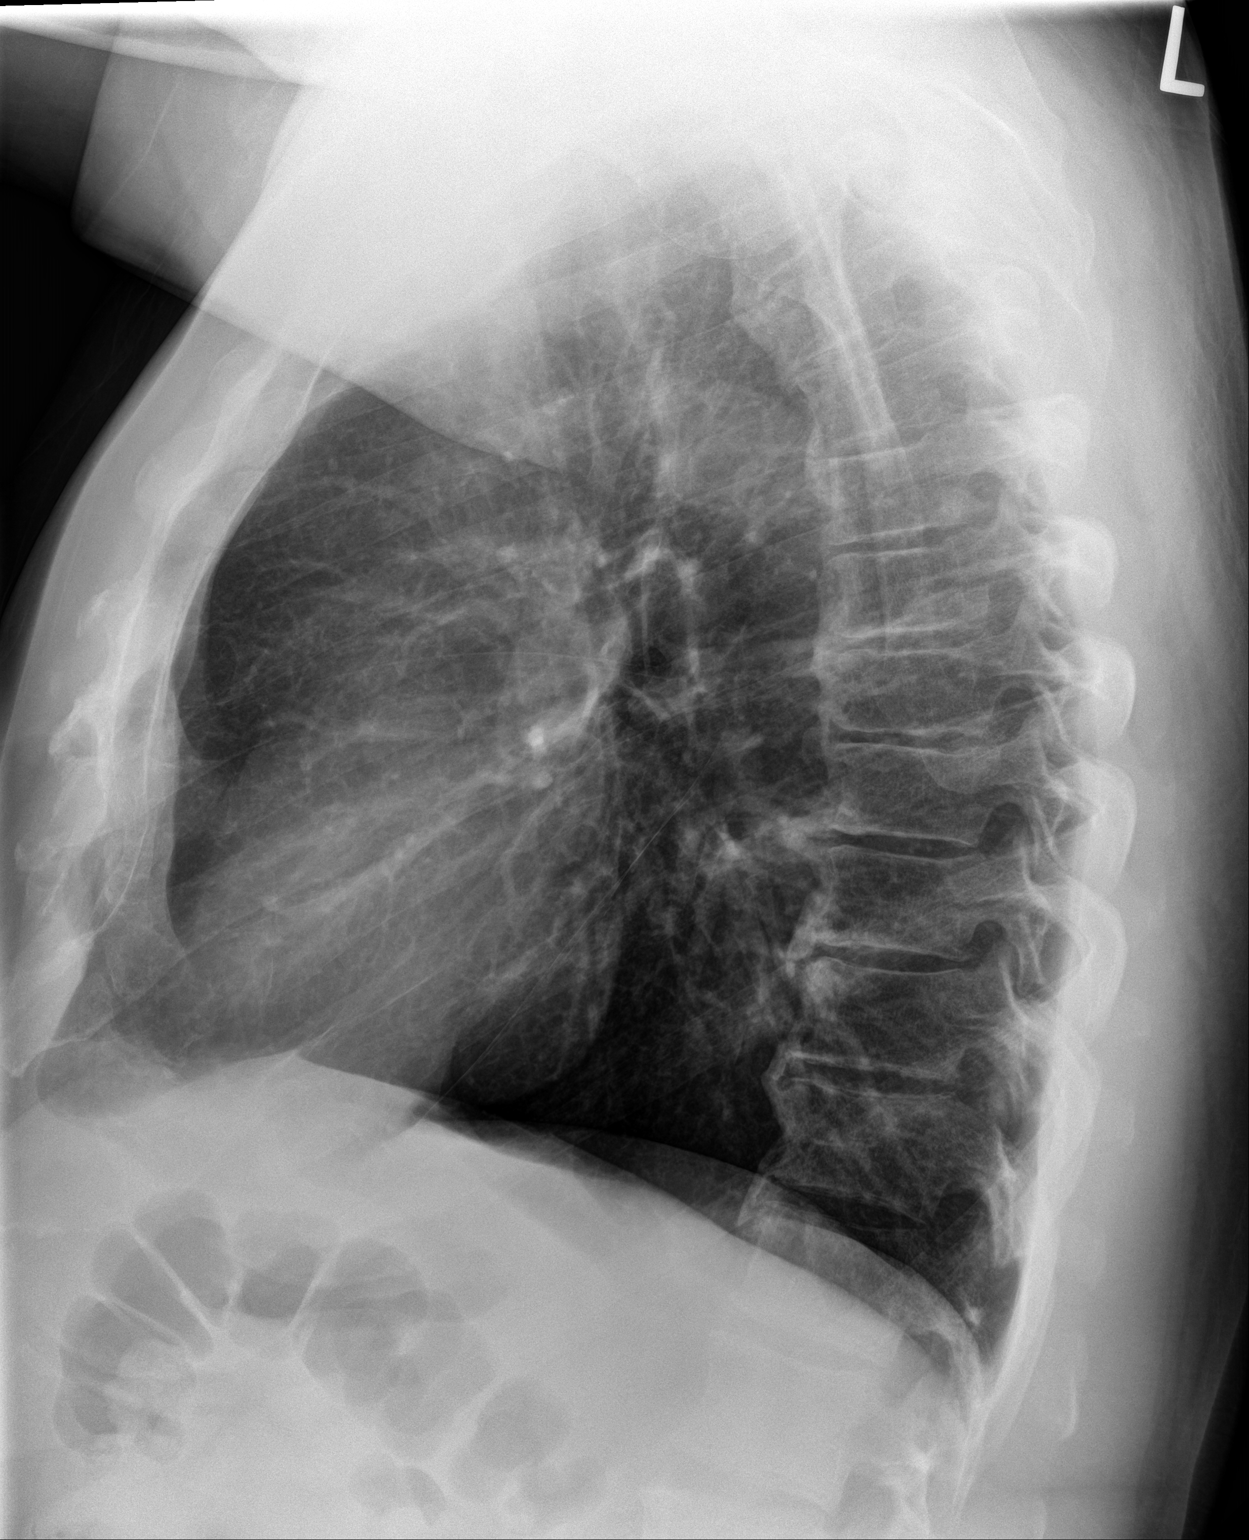

[chest pa (2 of 2)]
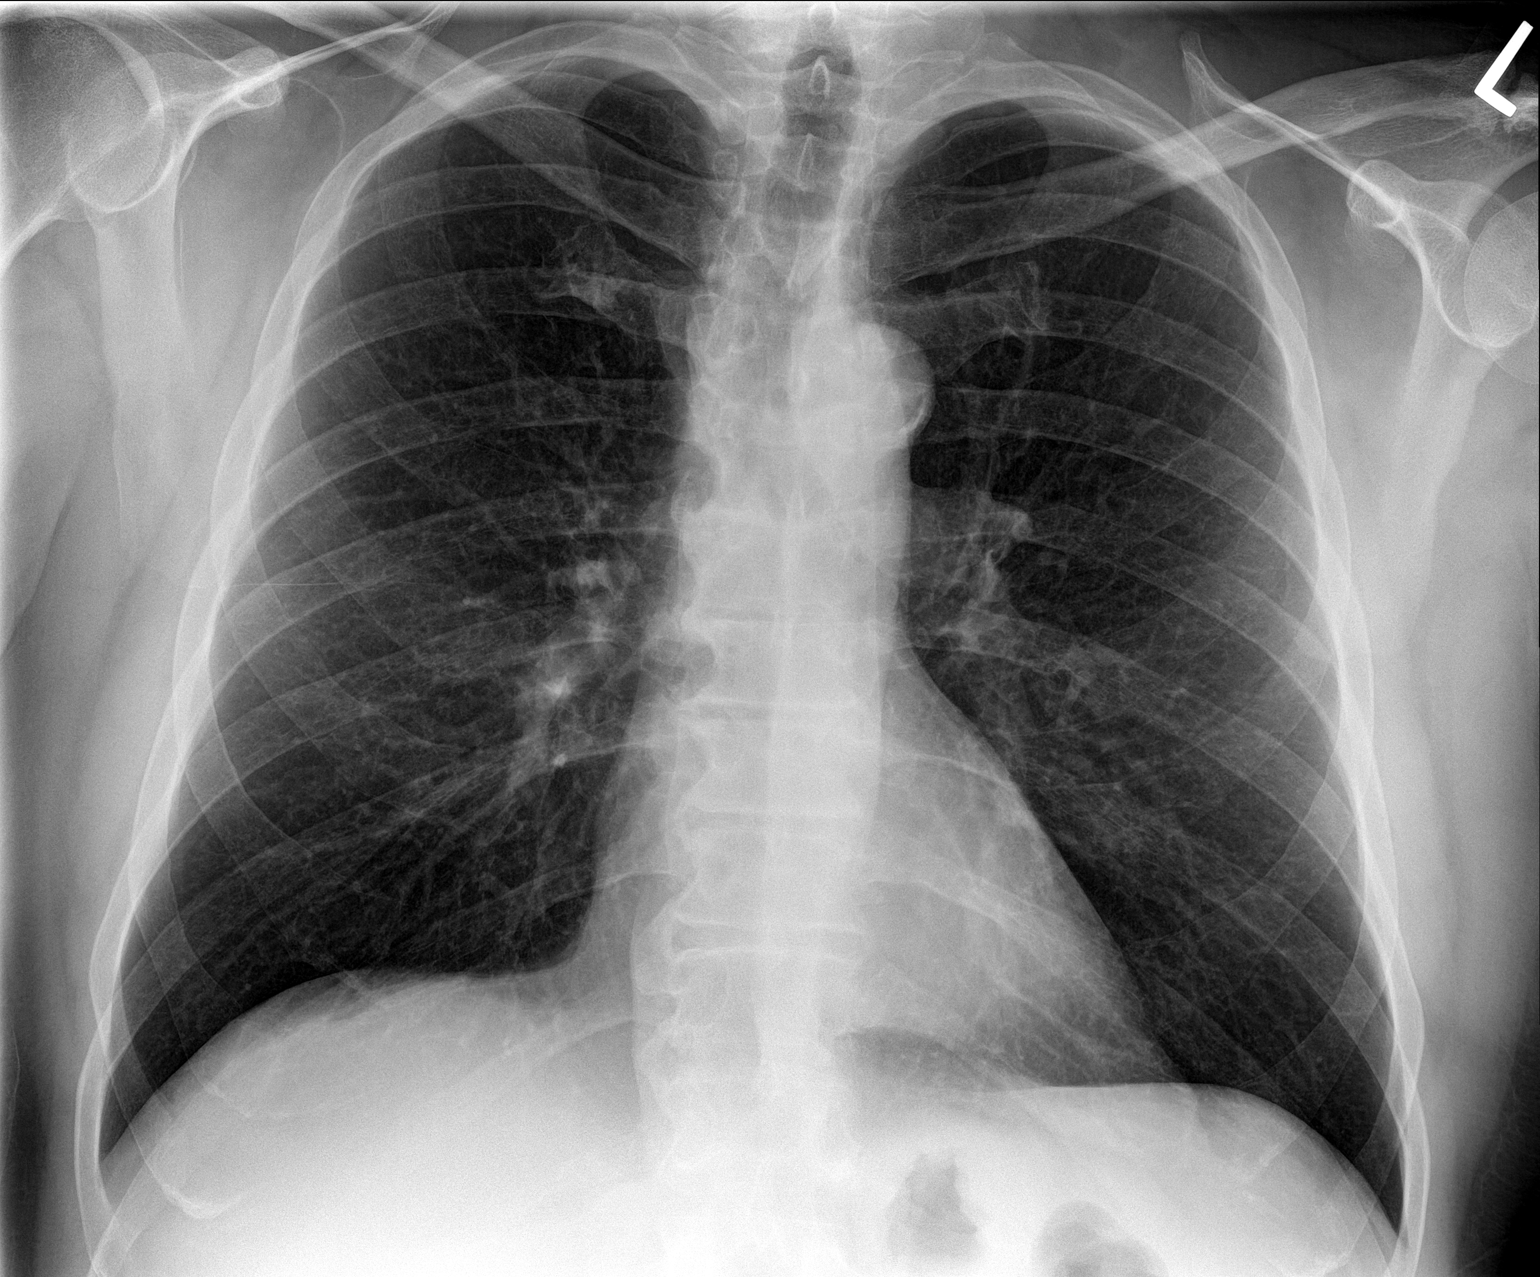

[3 of 3 positions shown; findings below may reference images not displayed]

FINDINGS: The heart size and mediastinal contours are within normal limits.
Aortic atherosclerosis. Both lungs are clear. The visualized
skeletal structures are unremarkable.
IMPRESSION: No active cardiopulmonary disease.

Aortic Atherosclerosis ([id number]).

## 2019-11-17 ENCOUNTER — Other Ambulatory Visit (HOSPITAL_COMMUNITY): Payer: Self-pay | Admitting: Geriatric Medicine

## 2019-11-17 DIAGNOSIS — E669 Obesity, unspecified: Secondary | ICD-10-CM | POA: Diagnosis not present

## 2019-11-17 DIAGNOSIS — I700 Atherosclerosis of aorta: Secondary | ICD-10-CM | POA: Diagnosis not present

## 2019-11-17 DIAGNOSIS — K760 Fatty (change of) liver, not elsewhere classified: Secondary | ICD-10-CM | POA: Diagnosis not present

## 2019-11-17 DIAGNOSIS — M170 Bilateral primary osteoarthritis of knee: Secondary | ICD-10-CM | POA: Diagnosis not present

## 2019-11-17 DIAGNOSIS — Z125 Encounter for screening for malignant neoplasm of prostate: Secondary | ICD-10-CM | POA: Diagnosis not present

## 2019-11-17 DIAGNOSIS — M25561 Pain in right knee: Secondary | ICD-10-CM | POA: Diagnosis not present

## 2019-11-17 DIAGNOSIS — E7800 Pure hypercholesterolemia, unspecified: Secondary | ICD-10-CM | POA: Diagnosis not present

## 2019-11-17 DIAGNOSIS — M25562 Pain in left knee: Secondary | ICD-10-CM | POA: Diagnosis not present

## 2019-11-17 DIAGNOSIS — Z79899 Other long term (current) drug therapy: Secondary | ICD-10-CM | POA: Diagnosis not present

## 2019-11-17 DIAGNOSIS — Z0000 Encounter for general adult medical examination without abnormal findings: Secondary | ICD-10-CM | POA: Diagnosis not present

## 2019-11-22 ENCOUNTER — Other Ambulatory Visit (HOSPITAL_COMMUNITY): Payer: Self-pay | Admitting: Geriatric Medicine

## 2019-12-21 DIAGNOSIS — H524 Presbyopia: Secondary | ICD-10-CM | POA: Diagnosis not present

## 2019-12-21 DIAGNOSIS — H5203 Hypermetropia, bilateral: Secondary | ICD-10-CM | POA: Diagnosis not present

## 2019-12-21 DIAGNOSIS — H52223 Regular astigmatism, bilateral: Secondary | ICD-10-CM | POA: Diagnosis not present

## 2019-12-21 DIAGNOSIS — H2513 Age-related nuclear cataract, bilateral: Secondary | ICD-10-CM | POA: Diagnosis not present

## 2020-01-11 DIAGNOSIS — Z5181 Encounter for therapeutic drug level monitoring: Secondary | ICD-10-CM | POA: Diagnosis not present

## 2020-01-11 DIAGNOSIS — Z79899 Other long term (current) drug therapy: Secondary | ICD-10-CM | POA: Diagnosis not present

## 2020-01-11 DIAGNOSIS — Z79891 Long term (current) use of opiate analgesic: Secondary | ICD-10-CM | POA: Diagnosis not present

## 2020-03-13 DIAGNOSIS — M25512 Pain in left shoulder: Secondary | ICD-10-CM | POA: Diagnosis not present

## 2020-03-13 DIAGNOSIS — M25511 Pain in right shoulder: Secondary | ICD-10-CM | POA: Diagnosis not present

## 2020-03-15 ENCOUNTER — Other Ambulatory Visit (HOSPITAL_COMMUNITY): Payer: Self-pay | Admitting: Geriatric Medicine

## 2020-03-29 DIAGNOSIS — M791 Myalgia, unspecified site: Secondary | ICD-10-CM | POA: Diagnosis not present

## 2020-03-30 DIAGNOSIS — M791 Myalgia, unspecified site: Secondary | ICD-10-CM | POA: Diagnosis not present

## 2020-03-30 DIAGNOSIS — Z20822 Contact with and (suspected) exposure to covid-19: Secondary | ICD-10-CM | POA: Diagnosis not present

## 2020-05-08 ENCOUNTER — Other Ambulatory Visit (HOSPITAL_COMMUNITY): Payer: Self-pay | Admitting: Physical Medicine and Rehabilitation

## 2020-07-11 ENCOUNTER — Other Ambulatory Visit (HOSPITAL_COMMUNITY): Payer: Self-pay | Admitting: Chiropractic Medicine

## 2020-07-11 DIAGNOSIS — G894 Chronic pain syndrome: Secondary | ICD-10-CM | POA: Diagnosis not present

## 2020-08-21 ENCOUNTER — Other Ambulatory Visit (HOSPITAL_COMMUNITY): Payer: Self-pay

## 2020-09-21 ENCOUNTER — Other Ambulatory Visit (HOSPITAL_COMMUNITY): Payer: Self-pay

## 2020-09-21 MED FILL — Atorvastatin Calcium Tab 80 MG (Base Equivalent): ORAL | 60 days supply | Qty: 60 | Fill #0 | Status: AC

## 2020-11-09 DIAGNOSIS — M25562 Pain in left knee: Secondary | ICD-10-CM | POA: Diagnosis not present

## 2020-11-09 DIAGNOSIS — M25512 Pain in left shoulder: Secondary | ICD-10-CM | POA: Diagnosis not present

## 2020-11-09 DIAGNOSIS — M25511 Pain in right shoulder: Secondary | ICD-10-CM | POA: Diagnosis not present

## 2020-11-09 DIAGNOSIS — M25561 Pain in right knee: Secondary | ICD-10-CM | POA: Diagnosis not present

## 2020-12-04 ENCOUNTER — Other Ambulatory Visit (HOSPITAL_COMMUNITY): Payer: Self-pay

## 2020-12-05 ENCOUNTER — Other Ambulatory Visit (HOSPITAL_COMMUNITY): Payer: Self-pay

## 2020-12-05 MED ORDER — ATORVASTATIN CALCIUM 80 MG PO TABS
80.0000 mg | ORAL_TABLET | Freq: Every day | ORAL | 3 refills | Status: DC
  Filled 2020-12-05: qty 90, 90d supply, fill #0
  Filled 2021-03-23: qty 90, 90d supply, fill #1

## 2020-12-06 ENCOUNTER — Other Ambulatory Visit: Payer: Self-pay | Admitting: Geriatric Medicine

## 2020-12-06 ENCOUNTER — Encounter: Payer: Self-pay | Admitting: Radiology

## 2020-12-06 ENCOUNTER — Ambulatory Visit
Admission: RE | Admit: 2020-12-06 | Discharge: 2020-12-06 | Disposition: A | Discharge status: Home / Self Care | Payer: 59 | Source: Ambulatory Visit | Attending: Geriatric Medicine | Admitting: Geriatric Medicine

## 2020-12-06 DIAGNOSIS — E7800 Pure hypercholesterolemia, unspecified: Secondary | ICD-10-CM | POA: Diagnosis not present

## 2020-12-06 DIAGNOSIS — F17210 Nicotine dependence, cigarettes, uncomplicated: Secondary | ICD-10-CM

## 2020-12-06 DIAGNOSIS — I700 Atherosclerosis of aorta: Secondary | ICD-10-CM | POA: Diagnosis not present

## 2020-12-06 DIAGNOSIS — R202 Paresthesia of skin: Secondary | ICD-10-CM | POA: Diagnosis not present

## 2020-12-06 DIAGNOSIS — R7611 Nonspecific reaction to tuberculin skin test without active tuberculosis: Secondary | ICD-10-CM | POA: Diagnosis not present

## 2020-12-06 DIAGNOSIS — Z125 Encounter for screening for malignant neoplasm of prostate: Secondary | ICD-10-CM | POA: Diagnosis not present

## 2020-12-06 DIAGNOSIS — R030 Elevated blood-pressure reading, without diagnosis of hypertension: Secondary | ICD-10-CM | POA: Diagnosis not present

## 2020-12-06 DIAGNOSIS — Z79899 Other long term (current) drug therapy: Secondary | ICD-10-CM | POA: Diagnosis not present

## 2020-12-06 DIAGNOSIS — R0609 Other forms of dyspnea: Secondary | ICD-10-CM | POA: Diagnosis not present

## 2020-12-06 DIAGNOSIS — D369 Benign neoplasm, unspecified site: Secondary | ICD-10-CM | POA: Diagnosis not present

## 2020-12-06 DIAGNOSIS — K760 Fatty (change of) liver, not elsewhere classified: Secondary | ICD-10-CM | POA: Diagnosis not present

## 2020-12-06 DIAGNOSIS — Z0000 Encounter for general adult medical examination without abnormal findings: Secondary | ICD-10-CM | POA: Diagnosis not present

## 2020-12-07 ENCOUNTER — Telehealth: Payer: Self-pay

## 2020-12-07 NOTE — Telephone Encounter (Signed)
Referral notes received from [facility] [place], Phone #: [phone], Fax #: [phone]   A copy of the notes have been placed in the scheduling box for check-out to pick-up and to enter referral. Original notes placed in file cabinet.

## 2021-01-08 DIAGNOSIS — F17210 Nicotine dependence, cigarettes, uncomplicated: Secondary | ICD-10-CM | POA: Diagnosis not present

## 2021-01-08 DIAGNOSIS — R030 Elevated blood-pressure reading, without diagnosis of hypertension: Secondary | ICD-10-CM | POA: Diagnosis not present

## 2021-01-16 ENCOUNTER — Encounter: Payer: Self-pay | Admitting: Gastroenterology

## 2021-02-05 ENCOUNTER — Encounter: Payer: Self-pay | Admitting: *Deleted

## 2021-02-14 ENCOUNTER — Ambulatory Visit (AMBULATORY_SURGERY_CENTER): Payer: 59

## 2021-02-14 ENCOUNTER — Encounter: Payer: Self-pay | Admitting: Gastroenterology

## 2021-02-14 ENCOUNTER — Other Ambulatory Visit: Payer: Self-pay

## 2021-02-14 VITALS — Ht 69.0 in | Wt 240.0 lb

## 2021-02-14 DIAGNOSIS — Z86010 Personal history of colonic polyps: Secondary | ICD-10-CM

## 2021-02-14 MED ORDER — NA SULFATE-K SULFATE-MG SULF 17.5-3.13-1.6 GM/177ML PO SOLN: 1.0000 | Freq: Once | ORAL | 0 refills | Status: AC

## 2021-02-14 NOTE — Progress Notes (Signed)

## 2021-02-20 ENCOUNTER — Other Ambulatory Visit: Payer: Self-pay

## 2021-02-20 ENCOUNTER — Encounter: Payer: Self-pay | Admitting: Cardiology

## 2021-02-20 ENCOUNTER — Ambulatory Visit: Payer: 59 | Admitting: Cardiology

## 2021-02-20 VITALS — BP 130/78 | HR 87 | Ht 69.0 in | Wt 249.0 lb

## 2021-02-20 DIAGNOSIS — E7800 Pure hypercholesterolemia, unspecified: Secondary | ICD-10-CM

## 2021-02-20 DIAGNOSIS — I10 Essential (primary) hypertension: Secondary | ICD-10-CM

## 2021-02-20 DIAGNOSIS — R0609 Other forms of dyspnea: Secondary | ICD-10-CM | POA: Diagnosis not present

## 2021-02-20 NOTE — Addendum Note (Signed)
Addended by: Antonieta Iba on: 02/20/2021 10:00 AM   Modules accepted: Orders

## 2021-02-20 NOTE — Patient Instructions (Addendum)
Medication Instructions:  Your physician recommends that you continue on your current medications as directed. Please refer to the Current Medication list given to you today.  *If you need a refill on your cardiac medications before your next appointment, please call your pharmacy*  Lab Work: TODAY: BMET and TSH If you have labs (blood work) drawn today and your tests are completely normal, you will receive your results only by: [place] (if you have MyChart) OR A paper copy in the mail If you have any lab test that is abnormal or we need to change your treatment, we will call you to review the results.   Testing/Procedures: Your physician has requested that you have an echocardiogram. Echocardiography is a painless test that uses sound waves to create images of your heart. It provides your doctor with information about the size and shape of your heart and how well your heart's chambers and valves are working. This procedure takes approximately one hour. There are no restrictions for this procedure.  Your physician has requested that you have a coronary CTA scan. Please see below for further instructions.    Follow-Up: At [facility], you and your health needs are our priority.  As part of our continuing mission to provide you with exceptional heart care, we have created designated Provider Care Teams.  These Care Teams include your primary Cardiologist (physician) and Advanced Practice Providers (APPs -  Physician Assistants and Nurse Practitioners) who all work together to provide you with the care you need, when you need it.  Follow up with Dr. Radford Pax as needed based on results of testing.    Other Instructions   Your cardiac CT will be scheduled at:   [facility] [address] [place], [place] [address] [phone]  Please arrive at the [facility] main entrance (entrance A) of [place] 30 minutes prior to test start time. You can use the  FREE valet parking offered at the main entrance (encouraged to control the heart rate for the test) Proceed to the [facility] Radiology Department (first floor) to check-in and test prep.  Please follow these instructions carefully (unless otherwise directed):  Hold all erectile dysfunction medications at least 3 days (72 hrs) prior to test.  On the Night Before the Test: Be sure to Drink plenty of water. Do not consume any caffeinated/decaffeinated beverages or chocolate 12 hours prior to your test. Do not take any antihistamines 12 hours prior to your test.  On the Day of the Test: Drink plenty of water until 1 hour prior to the test. Do not eat any food 4 hours prior to the test. You may take your regular medications prior to the test.  Take metoprolol (Lopressor) two hours prior to test.  After the Test: Drink plenty of water. After receiving IV contrast, you may experience a mild flushed feeling. This is normal. On occasion, you may experience a mild rash up to 24 hours after the test. This is not dangerous. If this occurs, you can take Benadryl 25 mg and increase your fluid intake. If you experience trouble breathing, this can be serious. If it is severe call 911 IMMEDIATELY. If it is mild, please call our office.  Please allow 2-4 weeks for scheduling of routine cardiac CTs. Some insurance companies require a pre-authorization which may delay scheduling of this test.   For non-scheduling related questions, please contact the cardiac imaging nurse navigator should you have any questions/concerns: Marchia Bond, Cardiac Imaging Nurse  Navigator Gordy Clement, Cardiac Imaging Nurse Navigator [facility] Direct Office Dial: [phone]   For scheduling needs, including cancellations and rescheduling, please call [place], [phone].

## 2021-02-20 NOTE — Progress Notes (Signed)
Cardiology CONSULT Note    Date:  02/20/2021   ID:  Javier Vega, DOB 1960-10-22, MRN [id number]  PCP:  Javier Manes, MD  Cardiologist:  Javier Him, MD   Chief Complaint  Patient presents with   New Patient (Initial Visit)    DOE, HTN, HLD     History of Present Illness:  Javier Vega is a 60 y.o. male who is being seen today for the evaluation of DOE and abnormal EKG at the request of [place], Javier Ha, MD.  This is a 60yo male with a hx of GERD, fatty liver, HLD, HTN and OSA (not on CPAP) who is referred for DOE and abnormal EKG. 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  He says that he feels pretty good but the older he gets the more fatigued he gets.  He says that he has had SOB and over the last 10 years it has progressed.  He continues to smoke < 1/2ppd.   He does not know his fm hx as he is adopted. He denies any chest pain or pressure. His SOB varies with his activity.  He denies any PND or orthopnea.  He was on CPAP for a while but was intolerant and could not sleep.  He has some problems right knee swelling but no LE edema.  He denies any palpitations, syncope or dizziness.   Past Medical History:  Diagnosis Date   Anal fissure    Aortic atherosclerosis (HCC)    Arthritis    generalized-on meds   Colon polyp    Fatty liver    GERD (gastroesophageal reflux disease)    with certain foods/delayed swallowing/burning certain foods   Hemorrhoids    History of kidney stones    Hyperlipidemia    on meds   Hypertension    on meds   Seasonal allergies    Sleep apnea    does not use as CPAP machine   Vitamin D deficiency     Past Surgical History:  Procedure Laterality Date   COLONOSCOPY  2019   KN-suprep(exc)-tics/TA- 3 yr recall   CYSTO  2002   EXTRACORPOREAL SHOCK WAVE LITHOTRIPSY Right 12/03/2018   Procedure: EXTRACORPOREAL SHOCK WAVE LITHOTRIPSY (ESWL);  Surgeon: Alexis Frock, MD;  Location: [facility];  Service: Urology;   Laterality: Right;  75 MINS   KNEE SURGERY Right    POLYPECTOMY  2019   TA   WISDOM TOOTH EXTRACTION      Current Medications: Current Meds  Medication Sig   acetaminophen (TYLENOL) 650 MG CR tablet Take 1,300 mg by mouth daily at 6 (six) AM.   atorvastatin (LIPITOR) 80 MG tablet TAKE 1/2 TO 1 TABLET BY MOUTH EACH DAY AS DIRECTED FOR CHOLESTEROL (Patient taking differently: Take 80 mg by mouth daily. FOR CHOLESTEROL)   cholecalciferol (VITAMIN D3) 25 MCG (1000 UT) tablet Take 1,000 Units by mouth daily.   naproxen sodium (ALEVE) 220 MG tablet Take 220 mg by mouth daily.   oxymetazoline (AFRIN) 0.05 % nasal spray Place 1 spray into both nostrils as needed for congestion.   predniSONE (DELTASONE) 20 MG tablet TAKE 1/2 TABLET BY MOUTH DAILY AS NEEDED FOR ARTHRITIC PAIN.    Allergies:   Crestor [rosuvastatin] and Sulfa antibiotics   Social History   Socioeconomic History   Marital status: Married    Spouse name: Not on file   Number of children: 2   Years of  education: Not on file   Highest education level: Not on file  Occupational History   Occupation: Maintenance    Employer:   Tobacco Use   Smoking status: Every Day    Packs/day: 0.50    Years: 21.00    Pack years: 10.50    Types: E-cigarettes, Cigarettes   Smokeless tobacco: Never   Tobacco comments:    smokes e cigs now-smokes about 6 cigarettes daily.  Vaping Use   Vaping Use: Former  Substance and Sexual Activity   Alcohol use: Yes    Alcohol/week: 0.0 standard drinks    Comment: Occassionally   Drug use: No   Sexual activity: Not on file  Other Topics Concern   Not on file  Social History Narrative   Not on file   Social Determinants of Health   Financial Resource Strain: Not on file  Food Insecurity: Not on file  Transportation Needs: Not on file  Physical Activity: Not on file  Stress: Not on file  Social Connections: Not on file     Family History:  The patient's family history is not  on file. He was adopted.   ROS:   Please see the history of present illness.    ROS All other systems reviewed and are negative.  No flowsheet data found.     PHYSICAL EXAM:   VS:  BP 130/78   Pulse 87   Ht 5\' 9"  (1.753 m)   Wt 249 lb (112.9 kg)   SpO2 95%   BMI 36.77 kg/m    GEN: Well nourished, well developed, in no acute distress  HEENT: normal  Neck: no JVD, carotid bruits, or masses Cardiac: RRR; no murmurs, rubs, or gallops,no edema.  Intact distal pulses bilaterally.  Respiratory:  clear to auscultation bilaterally, normal work of breathing GI: soft, nontender, nondistended, + BS MS: no deformity or atrophy  Skin: warm and dry, no rash Neuro:  Alert and Oriented x 3, Strength and sensation are intact Psych: euthymic mood, full affect  Wt Readings from Last 3 Encounters:  02/20/21 249 lb (112.9 kg)  02/14/21 240 lb (108.9 kg)  12/03/18 238 lb 12.8 oz (108.3 kg)      Studies/Labs Reviewed:   EKG:  EKG is ordered today.  The ekg ordered today demonstrates NSR with RBBB  Recent Labs: No results found for requested labs within last 8760 hours.   Lipid Panel    Component Value Date/Time   CHOL 245 (H) 06/17/2017 1548   TRIG 280 (H) 06/17/2017 1548   HDL 41 06/17/2017 1548   CHOLHDL 6.0 (H) 06/17/2017 1548   VLDL 61 (H) 07/30/2016 1703   LDLCALC 158 (H) 06/17/2017 1548    Additional studies/ records that were reviewed today include:  OV notes from PCP    ASSESSMENT:    1. DOE (dyspnea on exertion)   2. Essential hypertension   3. Pure hypercholesterolemia      PLAN:  In order of problems listed above:  DOE -suspect this is multifactorial from obesity and ongoing tobacco use -He does have abnormal EKG with RBBB which has progressed from iRBBB in 2018 -he has CRFs including HTN, HLD, obesity and ongoing tobacco use -Need to rule out CAD so will get coronary CTA to define coronary anatomy -check 2D echo to assess LVF  2.  HTN -BP is  controlled on exam today -this is diet controlled  3.  HLD -LDL goal < 100 -I have personally reviewed and interpreted outside  labs performed by patient's PCP which showed LDL 91, HDL 47, TAGs 192 and ALT 36 in Aug 2022  -continue prescription drug management with atorvastatin 80mg  daily with PRN refills  Time Spent: 25 minutes total time of encounter, including 15 minutes spent in face-to-face patient care on the date of this encounter. This time includes coordination of care and counseling regarding above mentioned problem list. Remainder of non-face-to-face time involved reviewing chart documents/testing relevant to the patient encounter and documentation in the medical record. I have independently reviewed documentation from referring provider  Medication Adjustments/Labs and Tests Ordered: Current medicines are reviewed at length with the patient today.  Concerns regarding medicines are outlined above.  Medication changes, Labs and Tests ordered today are listed in the Patient Instructions below.  There are no Patient Instructions on file for this visit.   Signed, Javier Him, MD  02/20/2021 9:35 AM    [facility] [address], [place], [place]  [address] Phone: [phone]; Fax: [phone]

## 2021-02-22 LAB — BASIC METABOLIC PANEL
BUN/Creatinine Ratio: 25 — ABNORMAL HIGH (ref 9–20)
BUN: 20 mg/dL (ref 6–24)
CO2: 22 mmol/L (ref 20–29)
Calcium: 9.4 mg/dL (ref 8.7–10.2)
Chloride: 102 mmol/L (ref 96–106)
Creatinine, Ser: 0.79 mg/dL (ref 0.76–1.27)
Glucose: 96 mg/dL (ref 70–99)
Potassium: 4.2 mmol/L (ref 3.5–5.2)
Sodium: 138 mmol/L (ref 134–144)
eGFR: 102 mL/min/{1.73_m2} (ref >59)

## 2021-02-22 LAB — TSH: TSH: 1.41 u[IU]/mL (ref 0.450–4.500)

## 2021-02-23 DIAGNOSIS — R1030 Lower abdominal pain, unspecified: Secondary | ICD-10-CM | POA: Diagnosis not present

## 2021-02-23 DIAGNOSIS — K5792 Diverticulitis of intestine, part unspecified, without perforation or abscess without bleeding: Secondary | ICD-10-CM | POA: Diagnosis not present

## 2021-02-25 ENCOUNTER — Emergency Department (HOSPITAL_BASED_OUTPATIENT_CLINIC_OR_DEPARTMENT_OTHER): Payer: 59

## 2021-02-25 ENCOUNTER — Other Ambulatory Visit: Payer: Self-pay

## 2021-02-25 ENCOUNTER — Encounter (HOSPITAL_BASED_OUTPATIENT_CLINIC_OR_DEPARTMENT_OTHER): Payer: Self-pay | Admitting: Emergency Medicine

## 2021-02-25 ENCOUNTER — Emergency Department (HOSPITAL_BASED_OUTPATIENT_CLINIC_OR_DEPARTMENT_OTHER)
Admission: EM | Admit: 2021-02-25 | Discharge: 2021-02-25 | Disposition: A | Discharge status: Home / Self Care | Payer: 59 | Attending: Emergency Medicine | Admitting: Emergency Medicine

## 2021-02-25 DIAGNOSIS — K5900 Constipation, unspecified: Secondary | ICD-10-CM | POA: Diagnosis not present

## 2021-02-25 DIAGNOSIS — F17210 Nicotine dependence, cigarettes, uncomplicated: Secondary | ICD-10-CM | POA: Diagnosis not present

## 2021-02-25 DIAGNOSIS — I10 Essential (primary) hypertension: Secondary | ICD-10-CM | POA: Diagnosis not present

## 2021-02-25 DIAGNOSIS — N201 Calculus of ureter: Secondary | ICD-10-CM

## 2021-02-25 DIAGNOSIS — R1032 Left lower quadrant pain: Secondary | ICD-10-CM | POA: Insufficient documentation

## 2021-02-25 DIAGNOSIS — N202 Calculus of kidney with calculus of ureter: Secondary | ICD-10-CM | POA: Diagnosis not present

## 2021-02-25 LAB — CBC WITH DIFFERENTIAL/PLATELET
Abs Immature Granulocytes: 0.07 10*3/uL (ref 0.00–0.07)
Basophils Absolute: 0.1 10*3/uL (ref 0.0–0.1)
Basophils Relative: 0 %
Eosinophils Absolute: 0 10*3/uL (ref 0.0–0.5)
Eosinophils Relative: 0 %
HCT: 49.5 % (ref 39.0–52.0)
Hemoglobin: 17 g/dL (ref 13.0–17.0)
Immature Granulocytes: 0 %
Lymphocytes Relative: 8 %
Lymphs Abs: 1.4 10*3/uL (ref 0.7–4.0)
MCH: 31.3 pg (ref 26.0–34.0)
MCHC: 34.3 g/dL (ref 30.0–36.0)
MCV: 91 fL (ref 80.0–100.0)
Monocytes Absolute: 1.8 10*3/uL — ABNORMAL HIGH (ref 0.1–1.0)
Monocytes Relative: 11 %
Neutro Abs: 13.6 10*3/uL — ABNORMAL HIGH (ref 1.7–7.7)
Neutrophils Relative %: 81 %
Platelets: 242 10*3/uL (ref 150–400)
RBC: 5.44 MIL/uL (ref 4.22–5.81)
RDW: 13.1 % (ref 11.5–15.5)
WBC: 16.9 10*3/uL — ABNORMAL HIGH (ref 4.0–10.5)
nRBC: 0 % (ref 0.0–0.2)

## 2021-02-25 LAB — COMPREHENSIVE METABOLIC PANEL
ALT: 21 U/L (ref 0–44)
AST: 17 U/L (ref 15–41)
Albumin: 4.3 g/dL (ref 3.5–5.0)
Alkaline Phosphatase: 66 U/L (ref 38–126)
Anion gap: 11 (ref 5–15)
BUN: 20 mg/dL (ref 6–20)
CO2: 24 mmol/L (ref 22–32)
Calcium: 9.8 mg/dL (ref 8.9–10.3)
Chloride: 101 mmol/L (ref 98–111)
Creatinine, Ser: 1.24 mg/dL (ref 0.61–1.24)
GFR, Estimated: >60 mL/min (ref >60)
Glucose, Bld: 145 mg/dL — ABNORMAL HIGH (ref 70–99)
Potassium: 3.8 mmol/L (ref 3.5–5.1)
Sodium: 136 mmol/L (ref 135–145)
Total Bilirubin: 0.8 mg/dL (ref 0.3–1.2)
Total Protein: 7.5 g/dL (ref 6.5–8.1)

## 2021-02-25 LAB — URINALYSIS, ROUTINE W REFLEX MICROSCOPIC
Bilirubin Urine: NEGATIVE
Glucose, UA: NEGATIVE mg/dL
Ketones, ur: NEGATIVE mg/dL
Nitrite: NEGATIVE
Protein, ur: 30 mg/dL — AB
RBC / HPF: >50 RBC/hpf — ABNORMAL HIGH (ref 0–5)
Specific Gravity, Urine: 1.025 (ref 1.005–1.030)
pH: 5 (ref 5.0–8.0)

## 2021-02-25 LAB — LIPASE, BLOOD: Lipase: 17 U/L (ref 11–51)

## 2021-02-25 MED ORDER — HYDROMORPHONE HCL 1 MG/ML IJ SOLN
1.0000 mg | Freq: Once | INTRAMUSCULAR | Status: AC
  Administered 2021-02-25: 1 mg via INTRAVENOUS
  Filled 2021-02-25: qty 1

## 2021-02-25 MED ORDER — KETOROLAC TROMETHAMINE 30 MG/ML IJ SOLN
30.0000 mg | Freq: Once | INTRAMUSCULAR | Status: AC
  Administered 2021-02-25: 30 mg via INTRAVENOUS
  Filled 2021-02-25: qty 1

## 2021-02-25 MED ORDER — ONDANSETRON 8 MG PO TBDP: 8.0000 mg | ORAL_TABLET | Freq: Three times a day (TID) | ORAL | 0 refills | Status: DC | PRN

## 2021-02-25 MED ORDER — HYDROCODONE-ACETAMINOPHEN 5-325 MG PO TABS: 1.0000 | ORAL_TABLET | Freq: Four times a day (QID) | ORAL | 0 refills | Status: DC | PRN

## 2021-02-25 MED ORDER — FENTANYL CITRATE PF 50 MCG/ML IJ SOSY
50.0000 ug | PREFILLED_SYRINGE | Freq: Once | INTRAMUSCULAR | Status: AC
  Administered 2021-02-25: 50 ug via INTRAVENOUS
  Filled 2021-02-25: qty 1

## 2021-02-25 MED ORDER — IBUPROFEN 600 MG PO TABS: 600.0000 mg | ORAL_TABLET | Freq: Three times a day (TID) | ORAL | 0 refills | Status: AC | PRN

## 2021-02-25 NOTE — ED Triage Notes (Signed)
  Patient comes in with lower abdominal pain and L flank pain.  Patient states he was seen by his PCP a few days ago and diagnosed with diverticulitis and prescribed augmentin.  Patient has been taking antibiotic and tylenol as needed for pain.  Patient states he is unable to sleep and sit still.  Has had a few episodes of N/V this morning.  Pain 8/10, aching pain.

## 2021-02-25 NOTE — Discharge Instructions (Addendum)
You can take  ibuprofen to help with your kidney stone pain.  I also prescribed hydrocodone that you can take simultaneously for more severe pain.  Zofran is to help with nausea.  Follow-up with the urologist for further evaluation.  Use the urine strainer to help you determine if you have passed the kidney stones.

## 2021-02-25 NOTE — ED Notes (Signed)
Dc instructions reviewed with patient. Patient voiced understanding. Dc with belongings.  °

## 2021-02-25 NOTE — ED Provider Notes (Signed)
Patient initially seen by Dr. Karle Starch.  Please see his note.  Presentation concerning for the possibility of kidney stone.  Plan was to follow-up on patient's laboratory tests as well as imaging tests Physical Exam  BP (!) 179/98 (BP Location: Right Arm)   Pulse 86   Temp 98.2 F (36.8 C) (Oral)   Resp 18   Ht 1.753 m (5\' 9" )   Wt 112.9 kg   SpO2 96%   BMI 36.77 kg/m   Physical Exam Deferred ED Course/Procedures   Clinical Course as of 02/25/21 0802  Sun Feb 25, 2021  0647 CBC with moderate leukocytosis.  [CS]  [phone] Care of the patient signed out to Dr. Tomi Bamberger at the change of shift.  [CS]  0754 Pain improved after additional medications [JK]    Clinical Course User Index [CS] Truddie Hidden, MD [JK] Dorie Rank, MD    Procedures  MDM  Labs notable for elevated white blood cell count.  Large amount of blood noted in the urine.  Electrolyte panel unremarkable.    CT scan demonstrates 2 ureteral stones on the left side.  Patient was given additional pain medications including Toradol and Dilaudid and now is feeling much better.  Will discharge home with medications for pain and nausea.  Discussed outpatient follow-up with urology.       Dorie Rank, MD 02/25/21 [phone]

## 2021-02-25 NOTE — ED Provider Notes (Signed)
[facility] Provider Note  CSN: [id number] Arrival date & time: 02/25/21 0620    History Chief Complaint  Patient presents with   Abdominal Pain   Flank Pain    Javier Vega is a 60 y.o. male with prior history of kidney stones reports several days of L flank and LLQ abdominal pain that comes and goes in waves. When it hits it is very severe, causing nausea and vomiting. He went to his PCP 2 days ago and diagnosed with diverticulitis by exam and started on Augmentin with minimal improvement. He has also felt constipated. He is scheduled for a colonoscopy tomorrow so he drank half of his bowel prep which cleared out his bowels but made his pain worse. No fever. No dysuria.    Past Medical History:  Diagnosis Date   Anal fissure    Aortic atherosclerosis (HCC)    Arthritis    generalized-on meds   Colon polyp    Fatty liver    GERD (gastroesophageal reflux disease)    with certain foods/delayed swallowing/burning certain foods   Hemorrhoids    History of kidney stones    Hyperlipidemia    on meds   Hypertension    on meds   Seasonal allergies    Sleep apnea    does not use as CPAP machine   Vitamin D deficiency     Past Surgical History:  Procedure Laterality Date   COLONOSCOPY  2019   KN-suprep(exc)-tics/TA- 3 yr recall   CYSTO  2002   EXTRACORPOREAL SHOCK WAVE LITHOTRIPSY Right 12/03/2018   Procedure: EXTRACORPOREAL SHOCK WAVE LITHOTRIPSY (ESWL);  Surgeon: Alexis Frock, MD;  Location: [facility];  Service: Urology;  Laterality: Right;  57 MINS   KNEE SURGERY Right    POLYPECTOMY  2019   TA   WISDOM TOOTH EXTRACTION      Family History  Adopted: Yes  Problem Relation Age of Onset   Colon cancer Neg Hx        Pt was adopted, does not know Fx   Colon polyps Neg Hx     Social History   Tobacco Use   Smoking status: Every Day    Packs/day: 0.50    Years: 21.00    Pack years: 10.50    Types: E-cigarettes, Cigarettes    Smokeless tobacco: Never   Tobacco comments:    smokes e cigs now-smokes about 6 cigarettes daily.  Vaping Use   Vaping Use: Former  Substance Use Topics   Alcohol use: Yes    Alcohol/week: 0.0 standard drinks    Comment: Occassionally   Drug use: No     Home Medications Prior to Admission medications   Medication Sig Start Date End Date Taking? Authorizing Provider  acetaminophen (TYLENOL) 650 MG CR tablet Take 1,300 mg by mouth daily at 6 (six) AM.    [provider]  atorvastatin (LIPITOR) 80 MG tablet TAKE 1/2 TO 1 TABLET BY MOUTH EACH DAY AS DIRECTED FOR CHOLESTEROL Patient taking differently: Take 80 mg by mouth daily. FOR CHOLESTEROL 06/17/17   Liane Comber, NP  atorvastatin (LIPITOR) 80 MG tablet Take 1 tablet (80 mg total) by mouth daily. Patient not taking: No sig reported 12/05/20     cholecalciferol (VITAMIN D3) 25 MCG (1000 UT) tablet Take 1,000 Units by mouth daily.    [provider]  HYDROcodone-acetaminophen (NORCO/VICODIN) 5-325 MG tablet Take 1 tablet by mouth every 6 (six) hours as needed for moderate pain. Patient not taking:  Reported on 02/20/2021 12/01/18   Dalia Heading, PA-C  naproxen sodium (ALEVE) 220 MG tablet Take 220 mg by mouth daily.    [provider]  oxymetazoline (AFRIN) 0.05 % nasal spray Place 1 spray into both nostrils as needed for congestion.    [provider]  predniSONE (DELTASONE) 20 MG tablet TAKE 1/2 TABLET BY MOUTH DAILY AS NEEDED FOR ARTHRITIC PAIN. 03/15/20 03/15/21  Lajean Manes, MD     Allergies    Crestor [rosuvastatin] and Sulfa antibiotics   Review of Systems   Review of Systems A comprehensive review of systems was completed and negative except as noted in HPI.    Physical Exam BP (!) 179/98 (BP Location: Right Arm)   Pulse 86   Temp 98.2 F (36.8 C) (Oral)   Resp 18   Ht 5\' 9"  (1.753 m)   Wt 112.9 kg   SpO2 96%   BMI 36.77 kg/m   Physical Exam Vitals and nursing note  reviewed.  Constitutional:      Appearance: Normal appearance.  HENT:     Head: Normocephalic and atraumatic.     Nose: Nose normal.     Mouth/Throat:     Mouth: Mucous membranes are moist.  Eyes:     Extraocular Movements: Extraocular movements intact.     Conjunctiva/sclera: Conjunctivae normal.  Cardiovascular:     Rate and Rhythm: Normal rate.  Pulmonary:     Effort: Pulmonary effort is normal.     Breath sounds: Normal breath sounds.  Abdominal:     General: Abdomen is flat.     Palpations: Abdomen is soft.     Tenderness: There is abdominal tenderness in the left lower quadrant. There is no guarding.  Musculoskeletal:        General: No swelling. Normal range of motion.     Cervical back: Neck supple.  Skin:    General: Skin is warm and dry.  Neurological:     General: No focal deficit present.     Mental Status: He is alert.  Psychiatric:        Mood and Affect: Mood normal.     ED Results / Procedures / Treatments   Labs (all labs ordered are listed, but only abnormal results are displayed) Labs Reviewed  CBC WITH DIFFERENTIAL/PLATELET - Abnormal; Notable for the following components:      Result Value   WBC 16.9 (*)    Neutro Abs 13.6 (*)    Monocytes Absolute 1.8 (*)    All other components within normal limits  COMPREHENSIVE METABOLIC PANEL  LIPASE, BLOOD  URINALYSIS, ROUTINE W REFLEX MICROSCOPIC    EKG None   Radiology No results found.  Procedures Procedures  Medications Ordered in the ED Medications  fentaNYL (SUBLIMAZE) injection 50 mcg (50 mcg Intravenous Given 02/25/21 0643)     MDM Rules/Calculators/A&P MDM Patient with L flank pain, most consistent with renal colic but could be diverticulitis not responding to oral Abx. Will check labs, CT and fentanyl for comfort.   ED Course  I have reviewed the triage vital signs and the nursing notes.  Pertinent labs & imaging results that were available during my care of the patient were  reviewed by me and considered in my medical decision making (see chart for details).  Clinical Course as of 02/25/21 0706  Sun Feb 25, 2021  4097 CBC with moderate leukocytosis.  [CS]  [phone] Care of the patient signed out to Dr. Tomi Bamberger at the change of shift.  [  CS]    Clinical Course User Index [CS] Truddie Hidden, MD    Final Clinical Impression(s) / ED Diagnoses Final diagnoses:  None    Rx / DC Orders ED Discharge Orders     None        Truddie Hidden, MD 02/25/21 [phone]

## 2021-02-26 ENCOUNTER — Telehealth: Payer: Self-pay | Admitting: *Deleted

## 2021-02-26 NOTE — Telephone Encounter (Signed)
-----   Message from Lindon Romp, [place] sent at 02/23/2021  3:52 PM EDT ----- Regarding: RE: [facility] pt Beth, I I see you canceled the colonoscopy appt. Thank you for doing that since Shirlean Mylar is not here. So is it safe to assume that you have reached out to the patient to reschedule? ----- Message ----- From: Mauri Pole, MD Sent: 02/23/2021  12:56 PM EDT To: Osvaldo Angst, CRNA, Oda Kilts, CMA, # Subject: RE: [facility] pt                                     Agree Beth/Robin, please cancel the procedure and advise patient to call back once his work-up is complete and is cleared by cardiology to reschedule colonoscopy Thanks VN ----- Message ----- From: Osvaldo Angst, CRNA Sent: 02/23/2021  12:41 PM EDT To: Oda Kilts, CMA, Mauri Pole, MD Subject: [facility] pt                                         Dr. Silverio Decamp,  This pt is scheduled with you for a colonoscopy on 11/10.  He saw his cardiologist on 11/1 for DOE and is in the midst of a w/u for this complaint.  He colonoscopy will need to be postponed until it is completed.  Thanks,  Osvaldo Angst

## 2021-02-28 ENCOUNTER — Other Ambulatory Visit (HOSPITAL_COMMUNITY): Payer: Self-pay | Admitting: Emergency Medicine

## 2021-02-28 ENCOUNTER — Other Ambulatory Visit (HOSPITAL_COMMUNITY): Payer: Self-pay

## 2021-02-28 ENCOUNTER — Telehealth (HOSPITAL_COMMUNITY): Payer: Self-pay | Admitting: Emergency Medicine

## 2021-02-28 DIAGNOSIS — R0609 Other forms of dyspnea: Secondary | ICD-10-CM

## 2021-02-28 MED ORDER — METOPROLOL TARTRATE 100 MG PO TABS
100.0000 mg | ORAL_TABLET | Freq: Once | ORAL | 0 refills | Status: DC
  Filled 2021-02-28: qty 1, 1d supply, fill #0

## 2021-02-28 NOTE — Telephone Encounter (Signed)
Reaching out to patient to offer assistance regarding upcoming cardiac imaging study; pt verbalizes understanding of appt date/time, parking situation and where to check in, pre-test NPO status and medications ordered, and verified current allergies; name and call back number provided for further questions should they arise Marchia Bond RN Navigator Cardiac Imaging [facility] [phone] office [phone] cell  Denies iv issues 100mg  metoprolol tartrate

## 2021-03-01 ENCOUNTER — Encounter: Payer: 59 | Admitting: Gastroenterology

## 2021-03-02 ENCOUNTER — Telehealth: Payer: Self-pay

## 2021-03-02 ENCOUNTER — Other Ambulatory Visit: Payer: Self-pay

## 2021-03-02 ENCOUNTER — Other Ambulatory Visit (HOSPITAL_BASED_OUTPATIENT_CLINIC_OR_DEPARTMENT_OTHER): Payer: Self-pay | Admitting: Cardiology

## 2021-03-02 ENCOUNTER — Ambulatory Visit (HOSPITAL_COMMUNITY)
Admission: RE | Admit: 2021-03-02 | Discharge: 2021-03-02 | Disposition: A | Discharge status: Home / Self Care | Payer: 59 | Source: Ambulatory Visit | Attending: Cardiology | Admitting: Cardiology

## 2021-03-02 DIAGNOSIS — R072 Precordial pain: Secondary | ICD-10-CM

## 2021-03-02 DIAGNOSIS — R931 Abnormal findings on diagnostic imaging of heart and coronary circulation: Secondary | ICD-10-CM

## 2021-03-02 DIAGNOSIS — R0609 Other forms of dyspnea: Secondary | ICD-10-CM | POA: Insufficient documentation

## 2021-03-02 DIAGNOSIS — I2510 Atherosclerotic heart disease of native coronary artery without angina pectoris: Secondary | ICD-10-CM

## 2021-03-02 DIAGNOSIS — E7800 Pure hypercholesterolemia, unspecified: Secondary | ICD-10-CM

## 2021-03-02 MED ORDER — ASPIRIN EC 81 MG PO TBEC: 81.0000 mg | DELAYED_RELEASE_TABLET | Freq: Every day | ORAL | 3 refills | Status: DC

## 2021-03-02 MED ORDER — NITROGLYCERIN 0.4 MG SL SUBL
SUBLINGUAL_TABLET | SUBLINGUAL | Status: AC
  Filled 2021-03-02: qty 2

## 2021-03-02 MED ORDER — NITROGLYCERIN 0.4 MG SL SUBL
0.8000 mg | SUBLINGUAL_TABLET | Freq: Once | SUBLINGUAL | Status: AC
  Administered 2021-03-02: 0.8 mg via SUBLINGUAL

## 2021-03-02 MED ORDER — IOHEXOL 350 MG/ML SOLN
95.0000 mL | Freq: Once | INTRAVENOUS | Status: AC | PRN
  Administered 2021-03-02: 95 mL via INTRAVENOUS

## 2021-03-02 NOTE — Telephone Encounter (Signed)
The patient has been notified of the result and verbalized understanding.  All questions (if any) were answered. Antonieta Iba, RN 03/02/2021 4:37 PM

## 2021-03-02 NOTE — Telephone Encounter (Signed)
-----   Message from Sueanne Margarita, MD sent at 03/02/2021  4:13 PM EST ----- Coronary CT shows an elevated calcium score of 335.  There is plaque in the left main ostium that could be as high as 50%.  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  Please have him come in for an FLP and ALT.  Start aspirin 81 mg daily.  Will await CT FFR for further instructions

## 2021-03-05 ENCOUNTER — Ambulatory Visit (HOSPITAL_COMMUNITY)
Admission: RE | Admit: 2021-03-05 | Discharge: 2021-03-05 | Disposition: A | Discharge status: Home / Self Care | Payer: 59 | Source: Ambulatory Visit | Attending: Cardiology | Admitting: Cardiology

## 2021-03-05 DIAGNOSIS — R931 Abnormal findings on diagnostic imaging of heart and coronary circulation: Secondary | ICD-10-CM | POA: Diagnosis not present

## 2021-03-05 DIAGNOSIS — R072 Precordial pain: Secondary | ICD-10-CM | POA: Diagnosis not present

## 2021-03-05 DIAGNOSIS — I2510 Atherosclerotic heart disease of native coronary artery without angina pectoris: Secondary | ICD-10-CM | POA: Diagnosis not present

## 2021-03-07 ENCOUNTER — Ambulatory Visit (HOSPITAL_COMMUNITY): Payer: 59 | Attending: Cardiovascular Disease

## 2021-03-07 ENCOUNTER — Other Ambulatory Visit: Payer: Self-pay

## 2021-03-07 ENCOUNTER — Other Ambulatory Visit: Payer: 59 | Admitting: *Deleted

## 2021-03-07 DIAGNOSIS — R072 Precordial pain: Secondary | ICD-10-CM

## 2021-03-07 DIAGNOSIS — R931 Abnormal findings on diagnostic imaging of heart and coronary circulation: Secondary | ICD-10-CM

## 2021-03-07 DIAGNOSIS — E7800 Pure hypercholesterolemia, unspecified: Secondary | ICD-10-CM | POA: Diagnosis not present

## 2021-03-07 DIAGNOSIS — R0609 Other forms of dyspnea: Secondary | ICD-10-CM | POA: Insufficient documentation

## 2021-03-07 LAB — ECHOCARDIOGRAM COMPLETE
Area-P 1/2: 2.52 cm2
S' Lateral: 3.3 cm

## 2021-03-08 LAB — ALT: ALT: 31 IU/L (ref 0–44)

## 2021-03-08 LAB — LIPID PANEL
Chol/HDL Ratio: 3.1 ratio (ref 0.0–5.0)
Cholesterol, Total: 137 mg/dL (ref 100–199)
HDL: 44 mg/dL (ref >39)
LDL Chol Calc (NIH): 69 mg/dL (ref 0–99)
Triglycerides: 134 mg/dL (ref 0–149)
VLDL Cholesterol Cal: 24 mg/dL (ref 5–40)

## 2021-03-20 ENCOUNTER — Telehealth: Payer: Self-pay | Admitting: Gastroenterology

## 2021-03-20 ENCOUNTER — Other Ambulatory Visit (HOSPITAL_COMMUNITY): Payer: Self-pay

## 2021-03-20 DIAGNOSIS — Z86010 Personal history of colonic polyps: Secondary | ICD-10-CM

## 2021-03-20 MED ORDER — NA SULFATE-K SULFATE-MG SULF 17.5-3.13-1.6 GM/177ML PO SOLN
1.0000 | Freq: Once | ORAL | 0 refills | Status: AC
  Filled 2021-03-20: qty 354, 1d supply, fill #0

## 2021-03-20 NOTE — Telephone Encounter (Signed)
Patient called and rescheduled colonoscopy with Dr. Silverio Decamp.  The new date and time is 05/22/21 at 9:00 a.m.  He will need new instructions for that date and time.  Please call patient and let him know.  Thank you.

## 2021-03-20 NOTE — Telephone Encounter (Signed)
Pt RS his colon to 1-31 He also accidentally threw his prep in the trash so needs another sent to [place] in the mail new instructions for 05-22-2021 colon, verified mailing address, sent in [place] to [place]  Pt aware of both

## 2021-03-21 ENCOUNTER — Other Ambulatory Visit (HOSPITAL_COMMUNITY): Payer: Self-pay

## 2021-03-23 ENCOUNTER — Other Ambulatory Visit (HOSPITAL_COMMUNITY): Payer: Self-pay

## 2021-03-23 MED ORDER — PREDNISONE 20 MG PO TABS
10.0000 mg | ORAL_TABLET | Freq: Every day | ORAL | 2 refills | Status: DC | PRN
  Filled 2021-03-23: qty 30, 60d supply, fill #0
  Filled 2022-01-25: qty 45, 90d supply, fill #0

## 2021-03-25 DIAGNOSIS — J209 Acute bronchitis, unspecified: Secondary | ICD-10-CM | POA: Diagnosis not present

## 2021-03-25 DIAGNOSIS — R051 Acute cough: Secondary | ICD-10-CM | POA: Diagnosis not present

## 2021-04-05 ENCOUNTER — Other Ambulatory Visit (HOSPITAL_COMMUNITY): Payer: Self-pay

## 2021-04-05 MED ORDER — DOXYCYCLINE HYCLATE 100 MG PO CAPS
100.0000 mg | ORAL_CAPSULE | Freq: Two times a day (BID) | ORAL | 0 refills | Status: DC
  Filled 2021-04-05: qty 14, 7d supply, fill #0

## 2021-04-17 ENCOUNTER — Ambulatory Visit
Admission: RE | Admit: 2021-04-17 | Discharge: 2021-04-17 | Disposition: A | Discharge status: Home / Self Care | Payer: 59 | Source: Ambulatory Visit | Attending: Geriatric Medicine | Admitting: Geriatric Medicine

## 2021-04-17 ENCOUNTER — Other Ambulatory Visit: Payer: Self-pay | Admitting: Geriatric Medicine

## 2021-04-17 DIAGNOSIS — R0981 Nasal congestion: Secondary | ICD-10-CM

## 2021-04-17 DIAGNOSIS — R052 Subacute cough: Secondary | ICD-10-CM | POA: Diagnosis not present

## 2021-04-17 DIAGNOSIS — I10 Essential (primary) hypertension: Secondary | ICD-10-CM | POA: Diagnosis not present

## 2021-04-27 DIAGNOSIS — H10413 Chronic giant papillary conjunctivitis, bilateral: Secondary | ICD-10-CM | POA: Diagnosis not present

## 2021-04-27 DIAGNOSIS — H52223 Regular astigmatism, bilateral: Secondary | ICD-10-CM | POA: Diagnosis not present

## 2021-04-27 DIAGNOSIS — H524 Presbyopia: Secondary | ICD-10-CM | POA: Diagnosis not present

## 2021-04-27 DIAGNOSIS — H5203 Hypermetropia, bilateral: Secondary | ICD-10-CM | POA: Diagnosis not present

## 2021-05-22 ENCOUNTER — Other Ambulatory Visit: Payer: Self-pay

## 2021-05-22 ENCOUNTER — Ambulatory Visit (AMBULATORY_SURGERY_CENTER): Payer: 59 | Admitting: Gastroenterology

## 2021-05-22 ENCOUNTER — Encounter: Payer: Self-pay | Admitting: Gastroenterology

## 2021-05-22 VITALS — BP 111/79 | HR 67 | Temp 98.4°F | Resp 18 | Ht 69.0 in | Wt 240.0 lb

## 2021-05-22 DIAGNOSIS — D123 Benign neoplasm of transverse colon: Secondary | ICD-10-CM

## 2021-05-22 DIAGNOSIS — Z86010 Personal history of colonic polyps: Secondary | ICD-10-CM | POA: Diagnosis not present

## 2021-05-22 DIAGNOSIS — Z1211 Encounter for screening for malignant neoplasm of colon: Secondary | ICD-10-CM | POA: Diagnosis not present

## 2021-05-22 DIAGNOSIS — K6289 Other specified diseases of anus and rectum: Secondary | ICD-10-CM

## 2021-05-22 DIAGNOSIS — G4733 Obstructive sleep apnea (adult) (pediatric): Secondary | ICD-10-CM | POA: Diagnosis not present

## 2021-05-22 DIAGNOSIS — I10 Essential (primary) hypertension: Secondary | ICD-10-CM | POA: Diagnosis not present

## 2021-05-22 MED ORDER — SODIUM CHLORIDE 0.9 % IV SOLN: 500.0000 mL | Freq: Once | INTRAVENOUS | Status: DC

## 2021-05-22 NOTE — Patient Instructions (Signed)
Handouts given for polyps.  Continue present medications.  Resume previous diet.  Await pathology results.    YOU HAD AN ENDOSCOPIC PROCEDURE TODAY AT [facility]:   Refer to the procedure report that was given to you for any specific questions about what was found during the examination.  If the procedure report does not answer your questions, please call your gastroenterologist to clarify.  If you requested that your care partner not be given the details of your procedure findings, then the procedure report has been included in a sealed envelope for you to review at your convenience later.  YOU SHOULD EXPECT: Some feelings of bloating in the abdomen. Passage of more gas than usual.  Walking can help get rid of the air that was put into your GI tract during the procedure and reduce the bloating. If you had a lower endoscopy (such as a colonoscopy or flexible sigmoidoscopy) you may notice spotting of blood in your stool or on the toilet paper. If you underwent a bowel prep for your procedure, you may not have a normal bowel movement for a few days.  Please Note:  You might notice some irritation and congestion in your nose or some drainage.  This is from the oxygen used during your procedure.  There is no need for concern and it should clear up in a day or so.  SYMPTOMS TO REPORT IMMEDIATELY:  Following lower endoscopy (colonoscopy or flexible sigmoidoscopy):  Excessive amounts of blood in the stool  Significant tenderness or worsening of abdominal pains  Swelling of the abdomen that is new, acute  Fever of 100F or higher  For urgent or emergent issues, a gastroenterologist can be reached at any hour by calling [phone]. Do not use MyChart messaging for urgent concerns.    DIET:  We do recommend a small meal at first, but then you may proceed to your regular diet.  Drink plenty of fluids but you should avoid alcoholic beverages for 24 hours.  ACTIVITY:  You should plan  to take it easy for the rest of today and you should NOT DRIVE or use heavy machinery until tomorrow (because of the sedation medicines used during the test).    FOLLOW UP: Our staff will call the number listed on your records 48-72 hours following your procedure to check on you and address any questions or concerns that you may have regarding the information given to you following your procedure. If we do not reach you, we will leave a message.  We will attempt to reach you two times.  During this call, we will ask if you have developed any symptoms of COVID 19. If you develop any symptoms (ie: fever, flu-like symptoms, shortness of breath, cough etc.) before then, please call [phone].  If you test positive for Covid 19 in the 2 weeks post procedure, please call and report this information to [place].    If any biopsies were taken you will be contacted by phone or by letter within the next 1-3 weeks.  Please call us at [phone] if you have not heard about the biopsies in 3 weeks.    SIGNATURES/CONFIDENTIALITY: You and/or your care partner have signed paperwork which will be entered into your electronic medical record.  These signatures attest to the fact that that the information above on your After Visit Summary has been reviewed and is understood.  Full responsibility of the confidentiality of this discharge information lies with you and/or your care-partner.

## 2021-05-22 NOTE — Progress Notes (Signed)
[facility] History and Physical   Primary Care Physician:  Lajean Manes, MD   Reason for Procedure:  History of adenomatous colon polyps  Plan:    Surveillance colonoscopy with possible interventions as needed     HPI: Javier Vega is a very pleasant 61 y.o. male here for surveillance colonoscopy. Denies any nausea, vomiting, abdominal pain, melena or bright red blood per rectum  The risks and benefits as well as alternatives of endoscopic procedure(s) have been discussed and reviewed. All questions answered. The patient agrees to proceed.    Past Medical History:  Diagnosis Date   Anal fissure    Aortic atherosclerosis (HCC)    Arthritis    generalized-on meds   Colon polyp    Fatty liver    GERD (gastroesophageal reflux disease)    with certain foods/delayed swallowing/burning certain foods   Hemorrhoids    History of kidney stones    Hyperlipidemia    on meds   Hypertension    on meds   Seasonal allergies    Sleep apnea    does not use as CPAP machine   Vitamin D deficiency     Past Surgical History:  Procedure Laterality Date   COLONOSCOPY  2019   KN-suprep(exc)-tics/TA- 3 yr recall   CYSTO  2002   EXTRACORPOREAL SHOCK WAVE LITHOTRIPSY Right 12/03/2018   Procedure: EXTRACORPOREAL SHOCK WAVE LITHOTRIPSY (ESWL);  Surgeon: Alexis Frock, MD;  Location: [facility];  Service: Urology;  Laterality: Right;  75 MINS   KNEE SURGERY Right    POLYPECTOMY  2019   TA   WISDOM TOOTH EXTRACTION      Prior to Admission medications   Medication Sig Start Date End Date Taking? Authorizing Provider  acetaminophen (TYLENOL) 650 MG CR tablet Take 1,300 mg by mouth daily at 6 (six) AM.   Yes [provider]  aspirin EC 81 MG tablet Take 1 tablet (81 mg total) by mouth daily. Swallow whole. 03/02/21  Yes Turner, Eber Hong, MD  atorvastatin (LIPITOR) 80 MG tablet TAKE 1/2 TO 1 TABLET BY MOUTH EACH DAY AS DIRECTED FOR CHOLESTEROL Patient taking  differently: Take 80 mg by mouth daily. FOR CHOLESTEROL 06/17/17  Yes Liane Comber, NP  cholecalciferol (VITAMIN D3) 25 MCG (1000 UT) tablet Take 1,000 Units by mouth daily.   Yes [provider]  oxymetazoline (AFRIN) 0.05 % nasal spray Place 1 spray into both nostrils as needed for congestion.   Yes [provider]  predniSONE (DELTASONE) 20 MG tablet Take 0.5 tablets (10 mg total) by mouth daily as needed for arthritic pain 03/23/21  Yes   HYDROcodone-acetaminophen (NORCO/VICODIN) 5-325 MG tablet Take 1 tablet by mouth every 6 (six) hours as needed. Patient not taking: Reported on 05/22/2021 02/25/21   Dorie Rank, MD  metoprolol tartrate (LOPRESSOR) 100 MG tablet Take 1 tablet (100 mg total) by mouth once for 1 dose 2 hours prior to cardia CT for heart rate control if heart rate>55bpm Patient not taking: Reported on 05/22/2021 02/28/21 03/01/21  Sueanne Margarita, MD  ondansetron (ZOFRAN ODT) 8 MG disintegrating tablet Take 1 tablet (8 mg total) by mouth every 8 (eight) hours as needed for nausea or vomiting. Patient not taking: Reported on 05/22/2021 02/25/21   Dorie Rank, MD    Current Outpatient Medications  Medication Sig Dispense Refill   acetaminophen (TYLENOL) 650 MG CR tablet Take 1,300 mg by mouth daily at 6 (six) AM.     aspirin EC 81 MG tablet Take  1 tablet (81 mg total) by mouth daily. Swallow whole. 90 tablet 3   atorvastatin (LIPITOR) 80 MG tablet TAKE 1/2 TO 1 TABLET BY MOUTH EACH DAY AS DIRECTED FOR CHOLESTEROL (Patient taking differently: Take 80 mg by mouth daily. FOR CHOLESTEROL) 90 tablet 1   cholecalciferol (VITAMIN D3) 25 MCG (1000 UT) tablet Take 1,000 Units by mouth daily.     oxymetazoline (AFRIN) 0.05 % nasal spray Place 1 spray into both nostrils as needed for congestion.     predniSONE (DELTASONE) 20 MG tablet Take 0.5 tablets (10 mg total) by mouth daily as needed for arthritic pain 60 tablet 2   HYDROcodone-acetaminophen (NORCO/VICODIN) 5-325 MG tablet  Take 1 tablet by mouth every 6 (six) hours as needed. (Patient not taking: Reported on 05/22/2021) 16 tablet 0   metoprolol tartrate (LOPRESSOR) 100 MG tablet Take 1 tablet (100 mg total) by mouth once for 1 dose 2 hours prior to cardia CT for heart rate control if heart rate>55bpm (Patient not taking: Reported on 05/22/2021) 1 tablet 0   ondansetron (ZOFRAN ODT) 8 MG disintegrating tablet Take 1 tablet (8 mg total) by mouth every 8 (eight) hours as needed for nausea or vomiting. (Patient not taking: Reported on 05/22/2021) 12 tablet 0   Current Facility-Administered Medications  Medication Dose Route Frequency Provider Last Rate Last Admin   0.9 %  sodium chloride infusion  500 mL Intravenous Once Mauri Pole, MD        Allergies as of 05/22/2021 - Review Complete 05/22/2021  Allergen Reaction Noted   Crestor [rosuvastatin] Other (See Comments) 05/17/2013   Sulfa antibiotics Other (See Comments) 11/30/2018    Family History  Adopted: Yes  Problem Relation Age of Onset   Colon cancer Neg Hx        Pt was adopted, does not know Fx   Colon polyps Neg Hx     Social History   Socioeconomic History   Marital status: Married    Spouse name: Not on file   Number of children: 2   Years of education: Not on file   Highest education level: Not on file  Occupational History   Occupation: Maintenance    Employer: [employer]  Tobacco Use   Smoking status: Every Day    Packs/day: 0.50    Years: 21.00    Pack years: 10.50    Types: E-cigarettes, Cigarettes   Smokeless tobacco: Never   Tobacco comments:    smokes e cigs now-smokes about 6 cigarettes daily.  Vaping Use   Vaping Use: Former  Substance and Sexual Activity   Alcohol use: Yes    Alcohol/week: 0.0 standard drinks    Comment: Occassionally   Drug use: No   Sexual activity: Not on file  Other Topics Concern   Not on file  Social History Narrative   Not on file   Social Determinants of Health   Financial  Resource Strain: Not on file  Food Insecurity: Not on file  Transportation Needs: Not on file  Physical Activity: Not on file  Stress: Not on file  Social Connections: Not on file  Intimate Partner Violence: Not on file    Review of Systems:  All other review of systems negative except as mentioned in the HPI.  Physical Exam: Vital signs in last 24 hours: BP (!) 142/81    Pulse 72    Temp 98.4 F (36.9 C)    Ht 5\' 9"  (1.753 m)    Wt 240 lb (  108.9 kg)    SpO2 97%    BMI 35.44 kg/m  General:   Alert, NAD Lungs:  Clear .   Heart:  Regular rate and rhythm Abdomen:  Soft, nontender and nondistended. Neuro/Psych:  Alert and cooperative. Normal mood and affect. A and O x 3  Reviewed labs, radiology imaging, old records and pertinent past GI work up  Patient is appropriate for planned procedure(s) and anesthesia in an ambulatory setting   K. Denzil Magnuson , MD [phone]

## 2021-05-22 NOTE — Progress Notes (Signed)
VS-CW  Pt's states no medical or surgical changes since previsit or office visit.  

## 2021-05-22 NOTE — Progress Notes (Signed)
VSS, transported to PACU °

## 2021-05-22 NOTE — Op Note (Signed)
[facility] Patient Name: Javier Vega Procedure Date: 05/22/2021 9:10 AM MRN: [id number] Endoscopist: Mauri Pole , MD Age: 61 Referring MD:  Date of Birth: [birth year] Gender: Male Account #: [id number] Procedure:                Colonoscopy Indications:              High risk colon cancer surveillance: Personal                            history of colonic polyps, High risk colon cancer                            surveillance: Personal history of adenoma (10 mm or                            greater in size), High risk colon cancer                            surveillance: Personal history of multiple (3 or                            more) adenomas Medicines:                Monitored Anesthesia Care Procedure:                Pre-Anesthesia Assessment:                           - Prior to the procedure, a History and Physical                            was performed, and patient medications and                            allergies were reviewed. The patient's tolerance of                            previous anesthesia was also reviewed. The risks                            and benefits of the procedure and the sedation                            options and risks were discussed with the patient.                            All questions were answered, and informed consent                            was obtained. Prior Anticoagulants: The patient has                            taken no previous anticoagulant or antiplatelet  agents. ASA Grade Assessment: III - A patient with                            severe systemic disease. After reviewing the risks                            and benefits, the patient was deemed in                            satisfactory condition to undergo the procedure.                           After obtaining informed consent, the colonoscope                            was passed under direct vision. Throughout the                             procedure, the patient's blood pressure, pulse, and                            oxygen saturations were monitored continuously. The                            CF [id number] #[id number] was introduced through the anus                            and advanced to the the cecum, identified by                            appendiceal orifice and ileocecal valve. The                            colonoscopy was performed without difficulty. The                            patient tolerated the procedure well. The quality                            of the bowel preparation was excellent. The                            ileocecal valve, appendiceal orifice, and rectum                            were photographed. Scope In: 9:22:32 AM Scope Out: 9:41:04 AM Scope Withdrawal Time: 0 hours 12 minutes 11 seconds  Total Procedure Duration: 0 hours 18 minutes 32 seconds  Findings:                 The perianal and digital rectal examinations were                            normal.  A 8 mm polyp was found in the transverse colon. The                            polyp was sessile. The polyp was removed with a                            cold snare. Resection and retrieval were complete.                           Hypertrophied anal pappilla and patchy area of                            granular mucosa was found at the anus. Biopsies                            were taken with a cold forceps for histology to                            exclude anal intra epithelial neoplasia.                           Non-bleeding internal hemorrhoids were found during                            retroflexion. The hemorrhoids were medium-sized. Complications:            No immediate complications. Estimated Blood Loss:     Estimated blood loss was minimal. Impression:               - One 8 mm polyp in the transverse colon, removed                            with a cold snare. Resected and  retrieved.                           - Granularity at the anus. Biopsied.                           - Non-bleeding internal hemorrhoids. Recommendation:           - Patient has a contact number available for                            emergencies. The signs and symptoms of potential                            delayed complications were discussed with the                            patient. Return to normal activities tomorrow.                            Written discharge instructions were provided to the  patient.                           - Resume previous diet.                           - Continue present medications.                           - Await pathology results.                           - Repeat colonoscopy in 5 years for surveillance                            based on pathology results. Mauri Pole, MD 05/22/2021 9:49:47 AM This report has been signed electronically.

## 2021-05-22 NOTE — Progress Notes (Signed)
Called to room to assist during endoscopic procedure.  Patient ID and intended procedure confirmed with present staff. Received instructions for my participation in the procedure from the performing physician.  

## 2021-05-24 ENCOUNTER — Telehealth: Payer: Self-pay

## 2021-05-24 NOTE — Telephone Encounter (Signed)
°  Follow up Call-  Call back number 05/22/2021  Post procedure Call Back phone  # [phone]  Permission to leave phone message Yes  Some recent data might be hidden     Patient questions:  Do you have a fever, pain , or abdominal swelling? No. Pain Score  0 *  Have you tolerated food without any problems? Yes.    Have you been able to return to your normal activities? Yes.    Do you have any questions about your discharge instructions: Diet   No. Medications  No. Follow up visit  No.  Do you have questions or concerns about your Care? No.  Actions: * If pain score is 4 or above: No action needed, pain <4.

## 2021-05-31 ENCOUNTER — Telehealth: Payer: Self-pay | Admitting: Gastroenterology

## 2021-05-31 NOTE — Telephone Encounter (Signed)
Inbound call from patient requesting a call back to discuss path results from 1/31

## 2021-05-31 NOTE — Telephone Encounter (Signed)
Called the patient. Advised no cancer or precancerous cell detected. He will receive a letter from the provider with her final interpretation and recommendations.

## 2021-06-07 ENCOUNTER — Encounter: Payer: Self-pay | Admitting: Gastroenterology

## 2021-06-11 ENCOUNTER — Other Ambulatory Visit (HOSPITAL_COMMUNITY): Payer: Self-pay

## 2021-06-11 DIAGNOSIS — H1045 Other chronic allergic conjunctivitis: Secondary | ICD-10-CM | POA: Diagnosis not present

## 2021-06-11 DIAGNOSIS — J3089 Other allergic rhinitis: Secondary | ICD-10-CM | POA: Diagnosis not present

## 2021-06-11 DIAGNOSIS — J300 Vasomotor rhinitis: Secondary | ICD-10-CM | POA: Diagnosis not present

## 2021-06-11 DIAGNOSIS — F17200 Nicotine dependence, unspecified, uncomplicated: Secondary | ICD-10-CM | POA: Diagnosis not present

## 2021-06-11 MED ORDER — AZELASTINE HCL 0.1 % NA SOLN
1.0000 | Freq: Two times a day (BID) | NASAL | 3 refills | Status: DC
  Filled 2021-06-11: qty 30, 25d supply, fill #0

## 2021-06-11 MED ORDER — AZELASTINE HCL 0.05 % OP SOLN
1.0000 [drp] | Freq: Two times a day (BID) | OPHTHALMIC | 3 refills | Status: DC
  Filled 2021-06-11: qty 6, 30d supply, fill #0

## 2021-06-11 MED ORDER — PREDNISONE 10 MG PO TABS
ORAL_TABLET | ORAL | 0 refills | Status: AC
  Filled 2021-06-11: qty 10, 4d supply, fill #0

## 2021-06-11 MED ORDER — MONTELUKAST SODIUM 10 MG PO TABS
10.0000 mg | ORAL_TABLET | Freq: Every day | ORAL | 3 refills | Status: DC
  Filled 2021-06-11: qty 30, 30d supply, fill #0
  Filled 2021-07-10: qty 30, 30d supply, fill #1
  Filled 2021-08-13: qty 30, 30d supply, fill #2
  Filled 2021-09-26: qty 30, 30d supply, fill #3

## 2021-07-10 ENCOUNTER — Other Ambulatory Visit (HOSPITAL_COMMUNITY): Payer: Self-pay

## 2021-07-10 MED ORDER — ATORVASTATIN CALCIUM 80 MG PO TABS
80.0000 mg | ORAL_TABLET | Freq: Every day | ORAL | 3 refills | Status: AC
  Filled 2021-07-10: qty 90, 90d supply, fill #0
  Filled 2021-10-19: qty 90, 90d supply, fill #1
  Filled 2022-02-20: qty 90, 90d supply, fill #2
  Filled 2022-05-23: qty 90, 90d supply, fill #3

## 2021-08-13 ENCOUNTER — Other Ambulatory Visit (HOSPITAL_COMMUNITY): Payer: Self-pay

## 2021-09-26 ENCOUNTER — Other Ambulatory Visit (HOSPITAL_COMMUNITY): Payer: Self-pay

## 2021-10-19 ENCOUNTER — Other Ambulatory Visit (HOSPITAL_COMMUNITY): Payer: Self-pay

## 2021-10-26 DIAGNOSIS — H0014 Chalazion left upper eyelid: Secondary | ICD-10-CM | POA: Diagnosis not present

## 2021-12-10 DIAGNOSIS — Z6835 Body mass index (BMI) 35.0-35.9, adult: Secondary | ICD-10-CM | POA: Diagnosis not present

## 2021-12-10 DIAGNOSIS — E7800 Pure hypercholesterolemia, unspecified: Secondary | ICD-10-CM | POA: Diagnosis not present

## 2021-12-10 DIAGNOSIS — Z0000 Encounter for general adult medical examination without abnormal findings: Secondary | ICD-10-CM | POA: Diagnosis not present

## 2021-12-10 DIAGNOSIS — Z79899 Other long term (current) drug therapy: Secondary | ICD-10-CM | POA: Diagnosis not present

## 2021-12-10 DIAGNOSIS — I700 Atherosclerosis of aorta: Secondary | ICD-10-CM | POA: Diagnosis not present

## 2021-12-10 DIAGNOSIS — Z125 Encounter for screening for malignant neoplasm of prostate: Secondary | ICD-10-CM | POA: Diagnosis not present

## 2021-12-10 DIAGNOSIS — Z23 Encounter for immunization: Secondary | ICD-10-CM | POA: Diagnosis not present

## 2021-12-10 DIAGNOSIS — I10 Essential (primary) hypertension: Secondary | ICD-10-CM | POA: Diagnosis not present

## 2021-12-12 ENCOUNTER — Other Ambulatory Visit: Payer: Self-pay | Admitting: Geriatric Medicine

## 2021-12-12 DIAGNOSIS — F17210 Nicotine dependence, cigarettes, uncomplicated: Secondary | ICD-10-CM

## 2022-01-08 ENCOUNTER — Ambulatory Visit
Admission: RE | Admit: 2022-01-08 | Discharge: 2022-01-08 | Disposition: A | Discharge status: Home / Self Care | Payer: 59 | Source: Ambulatory Visit | Attending: Geriatric Medicine | Admitting: Geriatric Medicine

## 2022-01-08 DIAGNOSIS — F17210 Nicotine dependence, cigarettes, uncomplicated: Secondary | ICD-10-CM | POA: Diagnosis not present

## 2022-01-23 ENCOUNTER — Ambulatory Visit: Payer: 59 | Admitting: Pulmonary Disease

## 2022-01-23 ENCOUNTER — Encounter: Payer: Self-pay | Admitting: Pulmonary Disease

## 2022-01-23 VITALS — BP 120/80 | HR 78 | Ht 68.0 in | Wt 235.2 lb

## 2022-01-23 DIAGNOSIS — F17200 Nicotine dependence, unspecified, uncomplicated: Secondary | ICD-10-CM

## 2022-01-23 DIAGNOSIS — Z6835 Body mass index (BMI) 35.0-35.9, adult: Secondary | ICD-10-CM

## 2022-01-23 DIAGNOSIS — J432 Centrilobular emphysema: Secondary | ICD-10-CM

## 2022-01-23 DIAGNOSIS — R911 Solitary pulmonary nodule: Secondary | ICD-10-CM | POA: Diagnosis not present

## 2022-01-23 NOTE — Patient Instructions (Signed)
Thank you for visiting Dr. Valeta Harms at [facility]. Today we recommend the following:  Orders Placed This Encounter  Procedures   NM PET Image Initial (PI) Skull Base To Thigh (F-18 FDG)   Pulmonary Function Test   See [place] after PET and PFTs complete   Ok to schedule PFTs at hospital PFT machine if needed for time   Return in about 3 weeks (around 02/13/2022) for with Eric Form, NP, or Dr. Valeta Harms.    Please do your part to reduce the spread of COVID-19.

## 2022-01-23 NOTE — Progress Notes (Signed)
Synopsis: Referred in October 2023 for CT chest, pulmonary nodule by Lajean Manes, MD  Subjective:   PATIENT ID: Javier Vega GENDER: male DOB: July 30, 1960, MRN: [id number]  Chief Complaint  Patient presents with   Consult    Lung nodule    This is a 62 year old gentleman, past medical history of hypertension, hyperlipidemia, vitamin D deficiency, sleep apnea.  Patient is a every day smoker and 1/2 pack/day.  Had a recent lung cancer screening CT for follow-up of lung nodule.Patient's lung cancer screening CT was complete on 01/08/2022 which revealed a spiculated nodular consolidation in the left lower lobe now measuring 17.1 mm previously was 4.9 mm.  Concerning for potential malignancy.  Patient was referred for evaluation of abnormal CT imaging.  He also has evidence of emphysema on his CT. patient has not had any previous lung cancer diagnoses.  No prior pulmonary function tests or PET scans.  Patient has no respiratory complaints today.  He is down to about 4 to 6 cigarettes a day.  He is really trying to quit.    Past Medical History:  Diagnosis Date   Anal fissure    Aortic atherosclerosis (HCC)    Arthritis    generalized-on meds   Colon polyp    Fatty liver    GERD (gastroesophageal reflux disease)    with certain foods/delayed swallowing/burning certain foods   Hemorrhoids    History of kidney stones    Hyperlipidemia    on meds   Hypertension    on meds   Seasonal allergies    Sleep apnea    does not use as CPAP machine   Vitamin D deficiency      Family History  Adopted: Yes  Problem Relation Age of Onset   Colon cancer Neg Hx        Pt was adopted, does not know Fx   Colon polyps Neg Hx      Past Surgical History:  Procedure Laterality Date   COLONOSCOPY  2019   KN-suprep(exc)-tics/TA- 3 yr recall   CYSTO  2002   EXTRACORPOREAL SHOCK WAVE LITHOTRIPSY Right 12/03/2018   Procedure: EXTRACORPOREAL SHOCK WAVE LITHOTRIPSY (ESWL);  Surgeon: Alexis Frock, MD;  Location: [facility];  Service: Urology;  Laterality: Right;  48 MINS   KNEE SURGERY Right    POLYPECTOMY  2019   TA   WISDOM TOOTH EXTRACTION      Social History   Socioeconomic History   Marital status: Married    Spouse name: Not on file   Number of children: 2   Years of education: Not on file   Highest education level: Not on file  Occupational History   Occupation: Maintenance    Employer: [employer]  Tobacco Use   Smoking status: Every Day    Packs/day: 0.50    Years: 21.00    Total pack years: 10.50    Types: Cigarettes   Smokeless tobacco: Never   Tobacco comments:    smokes cigs now-smokes about 4-5 cigarettes daily.  Vaping Use   Vaping Use: Former  Substance and Sexual Activity   Alcohol use: Yes    Alcohol/week: 0.0 standard drinks of alcohol    Comment: Occassionally   Drug use: No   Sexual activity: Not on file  Other Topics Concern   Not on file  Social History Narrative   Not on file   Social Determinants of Health   Financial Resource Strain: Not on file  Food Insecurity: Not on  file  Transportation Needs: Not on file  Physical Activity: Not on file  Stress: Not on file  Social Connections: Not on file  Intimate Partner Violence: Not on file     Allergies  Allergen Reactions   Crestor [Rosuvastatin] Other (See Comments)    unknown   Sulfa Antibiotics Other (See Comments)    unknown     Outpatient Medications Prior to Visit  Medication Sig Dispense Refill   oxymetazoline (AFRIN) 0.05 % nasal spray Place 1 spray into both nostrils as needed for congestion.     predniSONE (DELTASONE) 20 MG tablet Take 0.5 tablets (10 mg total) by mouth daily as needed for arthritic pain 60 tablet 2   acetaminophen (TYLENOL) 650 MG CR tablet Take 1,300 mg by mouth daily at 6 (six) AM.     aspirin EC 81 MG tablet Take 1 tablet (81 mg total) by mouth daily. Swallow whole. 90 tablet 3   atorvastatin (LIPITOR) 80 MG tablet TAKE 1/2 TO 1 TABLET BY  MOUTH EACH DAY AS DIRECTED FOR CHOLESTEROL (Patient taking differently: Take 80 mg by mouth daily. FOR CHOLESTEROL) 90 tablet 1   atorvastatin (LIPITOR) 80 MG tablet Take 1 tablet (80 mg total) by mouth daily. 90 tablet 3   azelastine (ASTELIN) 0.1 % nasal spray Place 1-2 sprays into both nostrils 2 (two) times daily. (Patient not taking: Reported on 01/23/2022) 30 mL 3   azelastine (OPTIVAR) 0.05 % ophthalmic solution Apply 1 drop to affected eye 2 (two) times daily. 6 mL 3   cholecalciferol (VITAMIN D3) 25 MCG (1000 UT) tablet Take 1,000 Units by mouth daily.     HYDROcodone-acetaminophen (NORCO/VICODIN) 5-325 MG tablet Take 1 tablet by mouth every 6 (six) hours as needed. (Patient not taking: Reported on 05/22/2021) 16 tablet 0   metoprolol tartrate (LOPRESSOR) 100 MG tablet Take 1 tablet (100 mg total) by mouth once for 1 dose 2 hours prior to cardia CT for heart rate control if heart rate>55bpm (Patient not taking: Reported on 05/22/2021) 1 tablet 0   montelukast (SINGULAIR) 10 MG tablet Take 1 tablet (10 mg total) by mouth daily. 30 tablet 3   ondansetron (ZOFRAN ODT) 8 MG disintegrating tablet Take 1 tablet (8 mg total) by mouth every 8 (eight) hours as needed for nausea or vomiting. (Patient not taking: Reported on 05/22/2021) 12 tablet 0   No facility-administered medications prior to visit.    Review of Systems  Constitutional:  Negative for chills, fever, malaise/fatigue and weight loss.  HENT:  Negative for hearing loss, sore throat and tinnitus.   Eyes:  Negative for blurred vision and double vision.  Respiratory:  Positive for cough and shortness of breath. Negative for hemoptysis, sputum production, wheezing and stridor.   Cardiovascular:  Negative for chest pain, palpitations, orthopnea, leg swelling and PND.  Gastrointestinal:  Negative for abdominal pain, constipation, diarrhea, heartburn, nausea and vomiting.  Genitourinary:  Negative for dysuria, hematuria and urgency.   Musculoskeletal:  Negative for joint pain and myalgias.  Skin:  Negative for itching and rash.  Neurological:  Negative for dizziness, tingling, weakness and headaches.  Endo/Heme/Allergies:  Negative for environmental allergies. Does not bruise/bleed easily.  Psychiatric/Behavioral:  Negative for depression. The patient is not nervous/anxious and does not have insomnia.   All other systems reviewed and are negative.    Objective:  Physical Exam Vitals reviewed.  Constitutional:      General: He is not in acute distress.    Appearance: He is well-developed. He  is obese.  HENT:     Head: Normocephalic and atraumatic.  Eyes:     General: No scleral icterus.    Conjunctiva/sclera: Conjunctivae normal.     Pupils: Pupils are equal, round, and reactive to light.  Neck:     Vascular: No JVD.     Trachea: No tracheal deviation.  Cardiovascular:     Rate and Rhythm: Normal rate and regular rhythm.     Heart sounds: Normal heart sounds. No murmur heard. Pulmonary:     Effort: Pulmonary effort is normal. No tachypnea, accessory muscle usage or respiratory distress.     Breath sounds: No stridor. No wheezing, rhonchi or rales.  Abdominal:     General: There is no distension.     Palpations: Abdomen is soft.     Tenderness: There is no abdominal tenderness.  Musculoskeletal:        General: No tenderness.     Cervical back: Neck supple.  Lymphadenopathy:     Cervical: No cervical adenopathy.  Skin:    General: Skin is warm and dry.     Capillary Refill: Capillary refill takes less than 2 seconds.     Findings: No rash.  Neurological:     Mental Status: He is alert and oriented to person, place, and time.  Psychiatric:        Behavior: Behavior normal.      Vitals:   01/23/22 1301  BP: 120/80  Pulse: 78  SpO2: 94%  Weight: 235 lb 3.2 oz (106.7 kg)  Height: 5\' 8"  (1.727 m)   94% on RA BMI Readings from Last 3 Encounters:  01/23/22 35.76 kg/m  05/22/21 35.44 kg/m   02/25/21 36.77 kg/m   Wt Readings from Last 3 Encounters:  01/23/22 235 lb 3.2 oz (106.7 kg)  05/22/21 240 lb (108.9 kg)  02/25/21 249 lb (112.9 kg)     CBC    Component Value Date/Time   WBC 16.9 (H) 02/25/2021 0630   RBC 5.44 02/25/2021 0630   HGB 17.0 02/25/2021 0630   HCT 49.5 02/25/2021 0630   PLT 242 02/25/2021 0630   MCV 91.0 02/25/2021 0630   MCH 31.3 02/25/2021 0630   MCHC 34.3 02/25/2021 0630   RDW 13.1 02/25/2021 0630   LYMPHSABS 1.4 02/25/2021 0630   MONOABS 1.8 (H) 02/25/2021 0630   EOSABS 0.0 02/25/2021 0630   BASOSABS 0.1 02/25/2021 0630    Chest Imaging: Lung cancer screening CT9/20/2023: Patient found to have a nodule that has increased in size in the left lower lobe since his August 2022 CT.  Found to be suspicious concerning for malignancy now measuring 17 mm. The patient's images have been independently reviewed by me.    Pulmonary Functions Testing Results:     No data to display          FeNO:   Pathology:   Echocardiogram:   Heart Catheterization:     Assessment & Plan:     ICD-10-CM   1. Left lower lobe pulmonary nodule  R91.1 NM PET Image Initial (PI) Skull Base To Thigh (F-18 FDG)    Pulmonary Function Test    2. Centrilobular emphysema ([place])  J43.2     3. Tobacco use disorder  F17.200     4. Class 2 severe obesity due to excess calories with serious comorbidity and body mass index (BMI) of 35.0 to 35.9 in adult [facility])  E66.01    Z68.35       Discussion:  This is a  62 year old smoker enrolled in lung cancer screening with primary care.  Patient found to have a 17 mm nodule that has slowly increased in size.  Was present in August 2020 to CT scan.  Plan: Lesions morphology changes concerning for malignancy.  Especially with his longstanding history of tobacco use. We will obtain a PET scan as well as a pulmonary function test. At his age and the fact that he is also trying to cut down or stop smoking he may be a good  candidate for a combination procedure with myself and Dr. Kipp Brood. Patient is agreeable to this plan and to start the work-up. We will start by obtaining a PET scan as well as his PFTs. He needs to see me or SG, NP after the tests have been complete within the next 3 weeks. Okay to get PFTs completed at the hospital if the wait time for the office is too long.    Current Outpatient Medications:    oxymetazoline (AFRIN) 0.05 % nasal spray, Place 1 spray into both nostrils as needed for congestion., Disp: , Rfl:    predniSONE (DELTASONE) 20 MG tablet, Take 0.5 tablets (10 mg total) by mouth daily as needed for arthritic pain, Disp: 60 tablet, Rfl: 2   acetaminophen (TYLENOL) 650 MG CR tablet, Take 1,300 mg by mouth daily at 6 (six) AM., Disp: , Rfl:    aspirin EC 81 MG tablet, Take 1 tablet (81 mg total) by mouth daily. Swallow whole., Disp: 90 tablet, Rfl: 3   atorvastatin (LIPITOR) 80 MG tablet, TAKE 1/2 TO 1 TABLET BY MOUTH EACH DAY AS DIRECTED FOR CHOLESTEROL (Patient taking differently: Take 80 mg by mouth daily. FOR CHOLESTEROL), Disp: 90 tablet, Rfl: 1   atorvastatin (LIPITOR) 80 MG tablet, Take 1 tablet (80 mg total) by mouth daily., Disp: 90 tablet, Rfl: 3   azelastine (ASTELIN) 0.1 % nasal spray, Place 1-2 sprays into both nostrils 2 (two) times daily. (Patient not taking: Reported on 01/23/2022), Disp: 30 mL, Rfl: 3   azelastine (OPTIVAR) 0.05 % ophthalmic solution, Apply 1 drop to affected eye 2 (two) times daily., Disp: 6 mL, Rfl: 3   cholecalciferol (VITAMIN D3) 25 MCG (1000 UT) tablet, Take 1,000 Units by mouth daily., Disp: , Rfl:    HYDROcodone-acetaminophen (NORCO/VICODIN) 5-325 MG tablet, Take 1 tablet by mouth every 6 (six) hours as needed. (Patient not taking: Reported on 05/22/2021), Disp: 16 tablet, Rfl: 0   metoprolol tartrate (LOPRESSOR) 100 MG tablet, Take 1 tablet (100 mg total) by mouth once for 1 dose 2 hours prior to cardia CT for heart rate control if heart rate>55bpm  (Patient not taking: Reported on 05/22/2021), Disp: 1 tablet, Rfl: 0   montelukast (SINGULAIR) 10 MG tablet, Take 1 tablet (10 mg total) by mouth daily., Disp: 30 tablet, Rfl: 3   ondansetron (ZOFRAN ODT) 8 MG disintegrating tablet, Take 1 tablet (8 mg total) by mouth every 8 (eight) hours as needed for nausea or vomiting. (Patient not taking: Reported on 05/22/2021), Disp: 12 tablet, Rfl: 0   Garner Nash, DO [facility] 01/23/2022 1:20 PM

## 2022-01-25 ENCOUNTER — Other Ambulatory Visit (HOSPITAL_COMMUNITY): Payer: Self-pay

## 2022-02-04 ENCOUNTER — Ambulatory Visit (HOSPITAL_COMMUNITY)
Admission: RE | Admit: 2022-02-04 | Discharge: 2022-02-04 | Disposition: A | Discharge status: Home / Self Care | Payer: 59 | Source: Ambulatory Visit | Attending: Pulmonary Disease | Admitting: Pulmonary Disease

## 2022-02-04 DIAGNOSIS — I6529 Occlusion and stenosis of unspecified carotid artery: Secondary | ICD-10-CM | POA: Diagnosis not present

## 2022-02-04 DIAGNOSIS — I700 Atherosclerosis of aorta: Secondary | ICD-10-CM | POA: Diagnosis not present

## 2022-02-04 DIAGNOSIS — I2510 Atherosclerotic heart disease of native coronary artery without angina pectoris: Secondary | ICD-10-CM | POA: Diagnosis not present

## 2022-02-04 DIAGNOSIS — R911 Solitary pulmonary nodule: Secondary | ICD-10-CM | POA: Insufficient documentation

## 2022-02-04 DIAGNOSIS — N132 Hydronephrosis with renal and ureteral calculous obstruction: Secondary | ICD-10-CM | POA: Diagnosis not present

## 2022-02-04 LAB — GLUCOSE, CAPILLARY: Glucose-Capillary: 129 mg/dL — ABNORMAL HIGH (ref 70–99)

## 2022-02-04 MED ORDER — FLUDEOXYGLUCOSE F - 18 (FDG) INJECTION
10.6900 | Freq: Once | INTRAVENOUS | Status: AC
  Administered 2022-02-04: 10.69 via INTRAVENOUS

## 2022-02-11 ENCOUNTER — Telehealth: Payer: Self-pay | Admitting: Pulmonary Disease

## 2022-02-11 NOTE — Telephone Encounter (Signed)
Called and spoke to patient about his PET scan results. He verbalized understanding. Nothing further needed

## 2022-02-11 NOTE — Telephone Encounter (Signed)
Called and spoke to patient and he would like the results of PET scan.   Please advise

## 2022-02-18 ENCOUNTER — Other Ambulatory Visit: Payer: Self-pay | Admitting: Urology

## 2022-02-18 DIAGNOSIS — N201 Calculus of ureter: Secondary | ICD-10-CM | POA: Diagnosis not present

## 2022-02-19 ENCOUNTER — Encounter (HOSPITAL_BASED_OUTPATIENT_CLINIC_OR_DEPARTMENT_OTHER): Payer: Self-pay | Admitting: Urology

## 2022-02-19 NOTE — Progress Notes (Signed)
Spoke w/ via phone for pre-op interview--- pt Lab needs dos----  [facility], ekg             Lab results------ no COVID test -----patient states asymptomatic no test needed Arrive at ------- 1215 on 02-21-2022 NPO after MN NO Solid Food.  Clear liquids from MN until--- 1115 Med rec completed Medications to take morning of surgery ----- lipitor, afrin spray Diabetic medication ----- n/a Patient instructed no nail polish to be worn day of surgery Patient instructed to bring photo id and insurance card day of surgery Patient aware to have Driver (ride ) / caregiver for 24 hours after surgery --- wife, dawn Patient Special Instructions ----- n/a Pre-Op special Istructions ----- n/a Patient verbalized understanding of instructions that were given at this phone interview. Patient denies shortness of breath, chest pain, fever, cough at this phone interview.

## 2022-02-20 ENCOUNTER — Other Ambulatory Visit (HOSPITAL_COMMUNITY): Payer: Self-pay

## 2022-02-21 ENCOUNTER — Encounter (HOSPITAL_BASED_OUTPATIENT_CLINIC_OR_DEPARTMENT_OTHER): Payer: Self-pay | Admitting: Urology

## 2022-02-21 ENCOUNTER — Ambulatory Visit (HOSPITAL_BASED_OUTPATIENT_CLINIC_OR_DEPARTMENT_OTHER): Payer: 59 | Admitting: Anesthesiology

## 2022-02-21 ENCOUNTER — Other Ambulatory Visit (HOSPITAL_COMMUNITY): Payer: Self-pay

## 2022-02-21 ENCOUNTER — Encounter (HOSPITAL_BASED_OUTPATIENT_CLINIC_OR_DEPARTMENT_OTHER)
Admission: RE | Disposition: A | Discharge status: Home / Self Care | Payer: Self-pay | Source: Ambulatory Visit | Attending: Urology

## 2022-02-21 ENCOUNTER — Ambulatory Visit (HOSPITAL_BASED_OUTPATIENT_CLINIC_OR_DEPARTMENT_OTHER)
Admission: RE | Admit: 2022-02-21 | Discharge: 2022-02-21 | Disposition: A | Discharge status: Home / Self Care | Payer: 59 | Source: Ambulatory Visit | Attending: Urology | Admitting: Urology

## 2022-02-21 DIAGNOSIS — N200 Calculus of kidney: Secondary | ICD-10-CM | POA: Diagnosis not present

## 2022-02-21 DIAGNOSIS — G4730 Sleep apnea, unspecified: Secondary | ICD-10-CM | POA: Diagnosis not present

## 2022-02-21 DIAGNOSIS — Z6836 Body mass index (BMI) 36.0-36.9, adult: Secondary | ICD-10-CM | POA: Insufficient documentation

## 2022-02-21 DIAGNOSIS — F17210 Nicotine dependence, cigarettes, uncomplicated: Secondary | ICD-10-CM | POA: Insufficient documentation

## 2022-02-21 DIAGNOSIS — N132 Hydronephrosis with renal and ureteral calculous obstruction: Secondary | ICD-10-CM | POA: Insufficient documentation

## 2022-02-21 DIAGNOSIS — I4891 Unspecified atrial fibrillation: Secondary | ICD-10-CM | POA: Insufficient documentation

## 2022-02-21 DIAGNOSIS — I10 Essential (primary) hypertension: Secondary | ICD-10-CM

## 2022-02-21 DIAGNOSIS — Z01818 Encounter for other preprocedural examination: Secondary | ICD-10-CM

## 2022-02-21 DIAGNOSIS — F17200 Nicotine dependence, unspecified, uncomplicated: Secondary | ICD-10-CM | POA: Diagnosis not present

## 2022-02-21 DIAGNOSIS — G4733 Obstructive sleep apnea (adult) (pediatric): Secondary | ICD-10-CM | POA: Diagnosis not present

## 2022-02-21 HISTORY — DX: Dyspnea, unspecified: R06.00

## 2022-02-21 HISTORY — PX: CYSTOSCOPY WITH RETROGRADE PYELOGRAM, URETEROSCOPY AND STENT PLACEMENT: SHX5789

## 2022-02-21 HISTORY — DX: Poor urinary stream: R39.12

## 2022-02-21 HISTORY — DX: Calculus of kidney: N20.0

## 2022-02-21 HISTORY — DX: Solitary pulmonary nodule: R91.1

## 2022-02-21 HISTORY — DX: Contact with and (suspected) exposure to tuberculosis: Z20.1

## 2022-02-21 HISTORY — DX: Other seasonal allergic rhinitis: J30.2

## 2022-02-21 HISTORY — DX: Chronic anal fissure: K60.1

## 2022-02-21 HISTORY — DX: Presence of spectacles and contact lenses: Z97.3

## 2022-02-21 HISTORY — DX: Obstructive sleep apnea (adult) (pediatric): G47.33

## 2022-02-21 HISTORY — DX: Unspecified right bundle-branch block: I45.10

## 2022-02-21 HISTORY — DX: Centrilobular emphysema: J43.2

## 2022-02-21 HISTORY — DX: Calculus of ureter: N20.1

## 2022-02-21 HISTORY — DX: Personal history of adenomatous and serrated colon polyps: Z86.0101

## 2022-02-21 HISTORY — DX: Personal history of colonic polyps: Z86.010

## 2022-02-21 LAB — POCT I-STAT, CHEM 8
BUN: 24 mg/dL — ABNORMAL HIGH (ref 6–20)
Calcium, Ion: 1.23 mmol/L (ref 1.15–1.40)
Chloride: 103 mmol/L (ref 98–111)
Creatinine, Ser: 0.9 mg/dL (ref 0.61–1.24)
Glucose, Bld: 100 mg/dL — ABNORMAL HIGH (ref 70–99)
HCT: 50 % (ref 39.0–52.0)
Hemoglobin: 17 g/dL (ref 13.0–17.0)
Potassium: 3.7 mmol/L (ref 3.5–5.1)
Sodium: 142 mmol/L (ref 135–145)
TCO2: 26 mmol/L (ref 22–32)

## 2022-02-21 SURGERY — CYSTOURETEROSCOPY, WITH RETROGRADE PYELOGRAM AND STENT INSERTION
Anesthesia: General | Laterality: Bilateral

## 2022-02-21 MED ORDER — FENTANYL CITRATE (PF) 100 MCG/2ML IJ SOLN
INTRAMUSCULAR | Status: DC | PRN
  Administered 2022-02-21 (×2): 50 ug via INTRAVENOUS

## 2022-02-21 MED ORDER — LACTATED RINGERS IV SOLN: INTRAVENOUS | Status: DC

## 2022-02-21 MED ORDER — PROPOFOL 10 MG/ML IV BOLUS
INTRAVENOUS | Status: DC | PRN
  Administered 2022-02-21: 200 mg via INTRAVENOUS
  Administered 2022-02-21: 50 mg via INTRAVENOUS
  Administered 2022-02-21: 100 mg via INTRAVENOUS

## 2022-02-21 MED ORDER — ONDANSETRON HCL 4 MG/2ML IJ SOLN: 4.0000 mg | Freq: Once | INTRAMUSCULAR | Status: DC | PRN

## 2022-02-21 MED ORDER — IOHEXOL 300 MG/ML  SOLN
INTRAMUSCULAR | Status: DC | PRN
  Administered 2022-02-21: 25 mL via URETHRAL

## 2022-02-21 MED ORDER — ONDANSETRON HCL 4 MG/2ML IJ SOLN
INTRAMUSCULAR | Status: AC
  Filled 2022-02-21: qty 2

## 2022-02-21 MED ORDER — FENTANYL CITRATE (PF) 100 MCG/2ML IJ SOLN: 25.0000 ug | INTRAMUSCULAR | Status: DC | PRN

## 2022-02-21 MED ORDER — GENTAMICIN SULFATE 40 MG/ML IJ SOLN
5.0000 mg/kg | INTRAVENOUS | Status: AC
  Administered 2022-02-21: 410 mg via INTRAVENOUS
  Filled 2022-02-21: qty 10.25

## 2022-02-21 MED ORDER — PHENYLEPHRINE 80 MCG/ML (10ML) SYRINGE FOR IV PUSH (FOR BLOOD PRESSURE SUPPORT)
PREFILLED_SYRINGE | INTRAVENOUS | Status: DC | PRN
  Administered 2022-02-21 (×3): 160 ug via INTRAVENOUS

## 2022-02-21 MED ORDER — ACETAMINOPHEN 160 MG/5ML PO SOLN: 325.0000 mg | ORAL | Status: DC | PRN

## 2022-02-21 MED ORDER — ONDANSETRON HCL 4 MG/2ML IJ SOLN
INTRAMUSCULAR | Status: DC | PRN
  Administered 2022-02-21: 4 mg via INTRAVENOUS

## 2022-02-21 MED ORDER — OXYCODONE HCL 5 MG/5ML PO SOLN: 5.0000 mg | Freq: Once | ORAL | Status: DC | PRN

## 2022-02-21 MED ORDER — MIDAZOLAM HCL 2 MG/2ML IJ SOLN
INTRAMUSCULAR | Status: AC
  Filled 2022-02-21: qty 2

## 2022-02-21 MED ORDER — DEXAMETHASONE SODIUM PHOSPHATE 10 MG/ML IJ SOLN
INTRAMUSCULAR | Status: AC
  Filled 2022-02-21: qty 1

## 2022-02-21 MED ORDER — PHENYLEPHRINE 80 MCG/ML (10ML) SYRINGE FOR IV PUSH (FOR BLOOD PRESSURE SUPPORT)
PREFILLED_SYRINGE | INTRAVENOUS | Status: AC
  Filled 2022-02-21: qty 30

## 2022-02-21 MED ORDER — LIDOCAINE 2% (20 MG/ML) 5 ML SYRINGE
INTRAMUSCULAR | Status: DC | PRN
  Administered 2022-02-21: 100 mg via INTRAVENOUS

## 2022-02-21 MED ORDER — MEPERIDINE HCL 25 MG/ML IJ SOLN: 6.2500 mg | INTRAMUSCULAR | Status: DC | PRN

## 2022-02-21 MED ORDER — PROPOFOL 10 MG/ML IV BOLUS
INTRAVENOUS | Status: AC
  Filled 2022-02-21: qty 20

## 2022-02-21 MED ORDER — MIDAZOLAM HCL 5 MG/5ML IJ SOLN
INTRAMUSCULAR | Status: DC | PRN
  Administered 2022-02-21: 2 mg via INTRAVENOUS

## 2022-02-21 MED ORDER — FENTANYL CITRATE (PF) 100 MCG/2ML IJ SOLN
INTRAMUSCULAR | Status: AC
  Filled 2022-02-21: qty 2

## 2022-02-21 MED ORDER — CEPHALEXIN 500 MG PO CAPS
500.0000 mg | ORAL_CAPSULE | Freq: Two times a day (BID) | ORAL | 0 refills | Status: AC
  Filled 2022-02-21: qty 6, 3d supply, fill #0

## 2022-02-21 MED ORDER — SENNOSIDES-DOCUSATE SODIUM 8.6-50 MG PO TABS
1.0000 | ORAL_TABLET | Freq: Two times a day (BID) | ORAL | 0 refills | Status: AC
  Filled 2022-02-21: qty 10, 5d supply, fill #0

## 2022-02-21 MED ORDER — OXYCODONE-ACETAMINOPHEN 5-325 MG PO TABS
1.0000 | ORAL_TABLET | Freq: Four times a day (QID) | ORAL | 0 refills | Status: DC | PRN
  Filled 2022-02-21: qty 15, 4d supply, fill #0

## 2022-02-21 MED ORDER — ACETAMINOPHEN 325 MG PO TABS: 325.0000 mg | ORAL_TABLET | ORAL | Status: DC | PRN

## 2022-02-21 MED ORDER — SODIUM CHLORIDE 0.9 % IR SOLN
Status: DC | PRN
  Administered 2022-02-21: 3000 mL

## 2022-02-21 MED ORDER — DEXAMETHASONE SODIUM PHOSPHATE 10 MG/ML IJ SOLN
INTRAMUSCULAR | Status: DC | PRN
  Administered 2022-02-21: 10 mg via INTRAVENOUS

## 2022-02-21 MED ORDER — OXYCODONE HCL 5 MG PO TABS: 5.0000 mg | ORAL_TABLET | Freq: Once | ORAL | Status: DC | PRN

## 2022-02-21 MED ORDER — KETOROLAC TROMETHAMINE 10 MG PO TABS
10.0000 mg | ORAL_TABLET | Freq: Three times a day (TID) | ORAL | 0 refills | Status: DC | PRN
  Filled 2022-02-21: qty 15, 5d supply, fill #0
  Filled 2022-03-20: qty 5, 2d supply, fill #1

## 2022-02-21 SURGICAL SUPPLY — 25 items
BAG DRAIN URO-CYSTO SKYTR STRL (DRAIN) ×1 IMPLANT
BAG DRN UROCATH (DRAIN) ×1
BASKET LASER NITINOL 1.9FR (BASKET) IMPLANT
BSKT STON RTRVL 120 1.9FR (BASKET) ×1
CATH URETL OPEN END 6FR 70 (CATHETERS) IMPLANT
CLOTH BEACON ORANGE TIMEOUT ST (SAFETY) ×1 IMPLANT
GLOVE BIO SURGEON STRL SZ7.5 (GLOVE) ×1 IMPLANT
GLOVE BIOGEL PI IND STRL 7.0 (GLOVE) IMPLANT
GOWN STRL REUS W/TWL LRG LVL3 (GOWN DISPOSABLE) ×1 IMPLANT
GOWN STRL REUS W/TWL XL LVL3 (GOWN DISPOSABLE) IMPLANT
GUIDEWIRE ANG ZIPWIRE 038X150 (WIRE) ×1 IMPLANT
GUIDEWIRE STR DUAL SENSOR (WIRE) ×1 IMPLANT
IV NS 1000ML (IV SOLUTION) ×1
IV NS 1000ML BAXH (IV SOLUTION) ×1 IMPLANT
IV NS IRRIG 3000ML ARTHROMATIC (IV SOLUTION) ×1 IMPLANT
KIT TURNOVER CYSTO (KITS) ×1 IMPLANT
MANIFOLD NEPTUNE II (INSTRUMENTS) ×1 IMPLANT
NS IRRIG 500ML POUR BTL (IV SOLUTION) ×1 IMPLANT
PACK CYSTO (CUSTOM PROCEDURE TRAY) ×1 IMPLANT
SHEATH URETERAL 12FRX35CM (MISCELLANEOUS) IMPLANT
STENT POLARIS 5FRX24 (STENTS) IMPLANT
SYR 10ML LL (SYRINGE) ×1 IMPLANT
TUBE CONNECTING 12X1/4 (SUCTIONS) ×1 IMPLANT
TUBE FEEDING 8FR 16IN STR KANG (MISCELLANEOUS) IMPLANT
TUBING UROLOGY SET (TUBING) ×1 IMPLANT

## 2022-02-21 NOTE — Discharge Instructions (Addendum)
1 - You may have urinary urgency (bladder spasms) and bloody urine on / off with stent in place. This is normal.  2 - Remove tethered stents on Monday morning at home by pulling on string, then blue-white plastic tubing, and discarding. Office is open Monday if any problems arise.   3 - Call MD or go to ER for fever >102, severe pain / nausea / vomiting not relieved by medications, or acute change in medical status  [facility] [phone] Post Ureteroscopy With or Without Stent Instructions  Definitions:  Ureter: The duct that transports urine from the kidney to the bladder. Stent:   A plastic hollow tube that is placed into the ureter, from the kidney to the bladder to prevent the ureter from swelling shut.  GENERAL INSTRUCTIONS:  Despite the fact that no skin incisions were used, the area around the ureter and bladder is raw and irritated. The stent is a foreign body which will further irritate the bladder wall. This irritation is manifested by increased frequency of urination, both day and night, and by an increase in the urge to urinate. In some, the urge to urinate is present almost always. Sometimes the urge is strong enough that you may not be able to stop yourself from urinating. The only real cure is to remove the stent and then give time for the bladder wall to heal which can't be done until the danger of the ureter swelling shut has passed, which varies.  You may see some blood in your urine while the stent is in place and a few days afterwards. Do not be alarmed, even if the urine was clear for a while. Get off your feet and drink lots of fluids until clearing occurs. If you start to pass clots or don't improve, call us.  DIET: You may return to your normal diet immediately. Because of the raw surface of your bladder, alcohol, spicy foods, acid type foods and drinks with caffeine may cause irritation or frequency and should be used in moderation. To keep your  urine flowing freely and to avoid constipation, drink plenty of fluids during the day ( 8-10 glasses ). Tip: Avoid cranberry juice because it is very acidic.  ACTIVITY: Your physical activity doesn't need to be restricted. However, if you are very active, you may see some blood in your urine. We suggest that you reduce your activity under these circumstances until the bleeding has stopped.  BOWELS: It is important to keep your bowels regular during the postoperative period. Straining with bowel movements can cause bleeding. A bowel movement every other day is reasonable. Use a mild laxative if needed, such as Milk of Magnesia 2-3 tablespoons, or 2 Dulcolax tablets. Call if you continue to have problems. If you have been taking narcotics for pain, before, during or after your surgery, you may be constipated. Take a laxative if necessary.   MEDICATION: You should resume your pre-surgery medications unless told not to. In addition you will often be given an antibiotic to prevent infection. These should be taken as prescribed until the bottles are finished unless you are having an unusual reaction to one of the drugs.  PROBLEMS YOU SHOULD REPORT TO [place]: Fevers over 100.5 Fahrenheit. Heavy bleeding, or clots ( See above notes about blood in urine ). Inability to urinate. Drug reactions ( hives, rash, nausea, vomiting, diarrhea ). Severe burning or pain with urination that is not improving.  FOLLOW-UP: You will need a follow-up appointment to monitor your  progress. Call for this appointment at the number listed above. Usually the first appointment will be about three to fourteen days after your surgery.     Post Anesthesia Home Care Instructions  Activity: Get plenty of rest for the remainder of the day. A responsible individual must stay with you for 24 hours following the procedure.  For the next 24 hours, DO NOT: -Drive a car -Paediatric nurse -Drink alcoholic beverages -Take any  medication unless instructed by your physician -Make any legal decisions or sign important papers.  Meals: Start with liquid foods such as gelatin or soup. Progress to regular foods as tolerated. Avoid greasy, spicy, heavy foods. If nausea and/or vomiting occur, drink only clear liquids until the nausea and/or vomiting subsides. Call your physician if vomiting continues.  Special Instructions/Symptoms: Your throat may feel dry or sore from the anesthesia or the breathing tube placed in your throat during surgery. If this causes discomfort, gargle with warm salt water. The discomfort should disappear within 24 hours.

## 2022-02-21 NOTE — Transfer of Care (Signed)
Immediate Anesthesia Transfer of Care Note  Patient: Javier Vega  Procedure(s) Performed: CYSTOSCOPY WITH RETROGRADE PYELOGRAM, URETEROSCOPY, STONE BASKETTING AND STENT PLACEMENT (Bilateral)  Patient Location: PACU  Anesthesia Type:General  Level of Consciousness: awake, alert , oriented, and patient cooperative  Airway & Oxygen Therapy: Patient Spontanous Breathing and Patient connected to face mask oxygen  Post-op Assessment: Report given to RN and Post -op Vital signs reviewed and stable  Post vital signs: Reviewed and stable  Last Vitals:  Vitals Value Taken Time  BP 149/86 02/21/22 1503  Temp    Pulse 72 02/21/22 1506  Resp 16 02/21/22 1506  SpO2 99 % 02/21/22 1506  Vitals shown include unvalidated device data.  Last Pain:  Vitals:   02/21/22 1225  TempSrc: Oral  PainSc: 1       Patients Stated Pain Goal: 2 (59/09/31 1216)  Complications: No notable events documented.

## 2022-02-21 NOTE — Brief Op Note (Signed)
02/21/2022  2:52 PM  PATIENT:  Javier Vega  61 y.o. male  PRE-OPERATIVE DIAGNOSIS:  BILATERAL RENAL STONE  POST-OPERATIVE DIAGNOSIS:  BILATERAL RENAL STONE  PROCEDURE:  Procedure(s) with comments: CYSTOSCOPY WITH RETROGRADE PYELOGRAM, URETEROSCOPY AND STENT PLACEMENT, STONE BASKETTING (Bilateral) - 1 HR  SURGEON:  Surgeon(s) and Role:    * Alexis Frock, MD - Primary  PHYSICIAN ASSISTANT:   ASSISTANTS: none   ANESTHESIA:   general  EBL:  minimal   BLOOD ADMINISTERED:none  DRAINS: none   LOCAL MEDICATIONS USED:  NONE  SPECIMEN:  Source of Specimen:  bilateral renal / ureteral stones  DISPOSITION OF SPECIMEN:   [facility] for compositional analysis  COUNTS:  YES  TOURNIQUET:  * No tourniquets in log *  DICTATION: .Other Dictation: Dictation Number [id number]  PLAN OF CARE: Discharge to home after PACU  PATIENT DISPOSITION:  PACU - hemodynamically stable.   Delay start of Pharmacological VTE agent (>24hrs) due to surgical blood loss or risk of bleeding: not applicable

## 2022-02-21 NOTE — Anesthesia Preprocedure Evaluation (Addendum)
Anesthesia Evaluation  Patient identified by MRN, date of birth, ID band Patient awake    Reviewed: Allergy & Precautions, H&P , NPO status , Patient's Chart, lab work & pertinent test results  Airway Mallampati: II  TM Distance: >3 FB Neck ROM: Full    Dental no notable dental hx. (+) Teeth Intact, Dental Advisory Given, Caps   Pulmonary neg pulmonary ROS, sleep apnea , Current Smoker and Patient abstained from smoking.   Pulmonary exam normal breath sounds clear to auscultation       Cardiovascular Exercise Tolerance: Good hypertension, Pt. on medications and Pt. on home beta blockers negative cardio ROS Normal cardiovascular exam+ dysrhythmias Atrial Fibrillation  Rhythm:Regular Rate:Normal  ECHO 11/22 EF NL VALVES NL   Neuro/Psych negative neurological ROS  negative psych ROS   GI/Hepatic negative GI ROS, Neg liver ROS,,,  Endo/Other  negative endocrine ROS  Morbid obesity  Renal/GU negative Renal ROS  negative genitourinary   Musculoskeletal negative musculoskeletal ROS (+) Arthritis ,    Abdominal   Peds negative pediatric ROS (+)  Hematology negative hematology ROS (+)   Anesthesia Other Findings   Reproductive/Obstetrics negative OB ROS                             Anesthesia Physical Anesthesia Plan  ASA: 3  Anesthesia Plan: General   Post-op Pain Management: Minimal or no pain anticipated   Induction: Intravenous  PONV Risk Score and Plan: 1 and Ondansetron and Dexamethasone  Airway Management Planned: LMA  Additional Equipment: None  Intra-op Plan:   Post-operative Plan: Extubation in OR  Informed Consent: I have reviewed the patients History and Physical, chart, labs and discussed the procedure including the risks, benefits and alternatives for the proposed anesthesia with the patient or authorized representative who has indicated his/her understanding and  acceptance.       Plan Discussed with: CRNA and Anesthesiologist  Anesthesia Plan Comments: ( )        Anesthesia Quick Evaluation

## 2022-02-21 NOTE — Anesthesia Procedure Notes (Addendum)
Procedure Name: LMA Insertion Date/Time: 02/21/2022 2:09 PM  Performed by: Rogers Blocker, CRNAPre-anesthesia Checklist: Patient identified, Emergency Drugs available, Suction available and Patient being monitored Patient Re-evaluated:Patient Re-evaluated prior to induction Oxygen Delivery Method: Circle System Utilized Preoxygenation: Pre-oxygenation with 100% oxygen Induction Type: IV induction Ventilation: Mask ventilation without difficulty LMA: LMA inserted LMA Size: 5.0 Number of attempts: 1 Placement Confirmation: positive ETCO2 Tube secured with: Tape Dental Injury: Teeth and Oropharynx as per pre-operative assessment

## 2022-02-21 NOTE — H&P (Signed)
Javier Vega is an 61 y.o. male.    Chief Complaint: Pre-OP BILATERAL Ureteroscopic Stone Manipulation  HPI:   1 - Uroliathiais -  11/2018 - s/p shockwave lithotripsy for 67mm Rt mid stone  01/2022 - Rt mid 10mm ureteral stoen with hydro, bilateral papillary tip calcs noted as well.   2 - Prostate Screening - No FHX prostate cancer  11/2018 - DRE 40gm smooth / PSA (normal per PCP labs per pt this year)   3 - Gross Hematuria - negative eval 2009 with CT, cytology, cysto.   PMH sig for obesity, knee arthritis, pulm nodule. 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 His PCP is Lajean Manes MD.   Today " Javier Vega " is seen to proceed with BILATERAL ureteroscopic stone manipulation. No interval fevers. Most recent UA without infectious parameters.    Past Medical History:  Diagnosis Date   Aortic atherosclerosis ([place])    Arthritis    Bilateral ureteral calculi    Centrilobular emphysema ([place])    followed by dr icard   Chronic anal fissure    Dyspnea    cardiology evaluation by dr t. Radford Pax , lov note 02-20-2021;  coroary CT FFA and echo normal   Fatty liver    GERD (gastroesophageal reflux disease)    Hemorrhoids    History of kidney stones    History of tuberculosis exposure    Hx of adenomatous colonic polyps    Hyperlipidemia    Hypertension    followed by pcp   ([phone]  pt stated monitored by pcp and pt stated checks bp at home,  no medication for 3 months, was taking norvasc 2.5mg  )   OSA (obstructive sleep apnea)    study in epic 07-11-2014  moderate osa (AHI 15.2/hr),  intolerant to cpap   Pulmonary nodule    pulmonologist--- dr icard   RBBB (right bundle branch block)    Renal calculus, bilateral    Seasonal allergic rhinitis    Vitamin D deficiency    Weak urinary stream    Wears glasses     Past Surgical History:  Procedure Laterality Date   COLONOSCOPY WITH PROPOFOL  2019   dr Silverio Decamp   EXTRACORPOREAL SHOCK WAVE LITHOTRIPSY Right 12/03/2018    Procedure: EXTRACORPOREAL SHOCK WAVE LITHOTRIPSY (ESWL);  Surgeon: Alexis Frock, MD;  Location: [facility];  Service: Urology;  Laterality: Right;  58 MINS   KNEE ARTHROSCOPY Right 2019   WISDOM TOOTH EXTRACTION      Family History  Adopted: Yes  Problem Relation Age of Onset   Colon cancer Neg Hx        Pt was adopted, does not know Fx   Colon polyps Neg Hx    Social History:  reports that he has been smoking cigarettes. He has a 22.50 pack-year smoking history. He has never used smokeless tobacco. He reports that he does not currently use alcohol. He reports that he does not use drugs.  Allergies:  Allergies  Allergen Reactions   Crestor [Rosuvastatin] Other (See Comments)   Sulfa Antibiotics Rash    unknown    No medications prior to admission.    No results found for this or any previous visit (from the past 48 hour(s)). No results found.  Review of Systems  Constitutional:  Negative for chills and fever.  All other systems reviewed and are negative.   Height 5\' 8"  (1.727 m), weight 104.3 kg. Physical Exam Vitals reviewed.  Eyes:  Pupils: Pupils are equal, round, and reactive to light.  Cardiovascular:     Rate and Rhythm: Normal rate.  Pulmonary:     Effort: Pulmonary effort is normal.  Abdominal:     General: Abdomen is flat.  Genitourinary:    Comments: Mild Rt CVAT ar present.  Musculoskeletal:        General: Normal range of motion.     Cervical back: Normal range of motion.  Neurological:     General: No focal deficit present.     Mental Status: He is alert.  Psychiatric:        Mood and Affect: Mood normal.      Assessment/Plan  Proceed as planned with BILATERAL ureteroscopy with goal of stone free. RIsks, benefits, alternatives, expected peri-op course including need for temporary stenting given bilateral procedure discussed previously and reiterated today.    Alexis Frock, MD 02/21/2022, 10:11 AM

## 2022-02-21 NOTE — Anesthesia Postprocedure Evaluation (Signed)
Anesthesia Post Note  Patient: Javier Vega  Procedure(s) Performed: CYSTOSCOPY WITH RETROGRADE PYELOGRAM, URETEROSCOPY, STONE BASKETTING AND STENT PLACEMENT (Bilateral)     Patient location during evaluation: PACU Anesthesia Type: General Level of consciousness: awake and alert Pain management: pain level controlled Vital Signs Assessment: post-procedure vital signs reviewed and stable Respiratory status: spontaneous breathing, nonlabored ventilation, respiratory function stable and patient connected to nasal cannula oxygen Cardiovascular status: blood pressure returned to baseline and stable Postop Assessment: no apparent nausea or vomiting Anesthetic complications: no   No notable events documented.  Last Vitals:  Vitals:   02/21/22 1515 02/21/22 1530  BP: (!) 138/95 (!) 135/92  Pulse: 69 66  Resp: 15 13  Temp:    SpO2: 99% 94%    Last Pain:  Vitals:   02/21/22 1530  TempSrc:   PainSc: 0-No pain                 Alyssabeth Bruster

## 2022-02-22 ENCOUNTER — Telehealth: Payer: Self-pay | Admitting: Pulmonary Disease

## 2022-02-22 ENCOUNTER — Encounter (HOSPITAL_BASED_OUTPATIENT_CLINIC_OR_DEPARTMENT_OTHER): Payer: Self-pay | Admitting: Urology

## 2022-02-22 NOTE — Telephone Encounter (Signed)
Called and spoke with pt who states he had 7 kidney stones removed yesterday 11/2. Pt will be going back to have the stents removed Monday 11/6. Pt is scheduled to have a PFT performed 11/8 but needs to know if it will still be okay for him to have it done that soon after the procedure.  With Dr. Valeta Harms being out of the office, routing to [facility] for review. Please advise.

## 2022-02-22 NOTE — Telephone Encounter (Signed)
Called and spoke with pt letting him know the info per [facility] and he verbalized understanding. Nothing further needed.

## 2022-02-22 NOTE — Op Note (Signed)
NAME: Javier Vega, Javier Vega MEDICAL RECORD NO: [id number] ACCOUNT NO: [id number] DATE OF BIRTH: [birth year] FACILITY: [facility] LOCATION: [facility] PHYSICIAN: Alexis Frock, MD  Operative Report   DATE OF PROCEDURE: 02/21/2022  PREOPERATIVE DIAGNOSES:  Right ureteral stone, bilateral renal stones.  POSTOPERATIVE DIAGNOSES: Right ureteral stone, bilateral renal stones.  PROCEDURE PERFORMED:  1.  Cystoscopy with bilateral retrograde pyelograms, interpretation. 2.  Bilateral ureteroscopy with basketing of stones. 3.  Insertion of bilateral ureteral stents.  ESTIMATED BLOOD LOSS:  Nil.  COMPLICATIONS:  None.  SPECIMEN:  Bilateral renal and ureteral stone fragments for composition analysis.  FINDINGS:  1.  Right UVJ ureteral stone with mild hydronephrosis. 2.  Bilateral papillary tip calcifications. 3.  Complete resolution of all accessible stone fragments larger than one-third mm following basket extraction. 4.  Successful placement of bilateral ureteral stents, proximal end in the renal pelvis, distal end in urinary bladder with tether.  INDICATIONS:  The patient is a very pleasant 61 year old man with prior history of urolithiasis.  He was found incidentally on PET imaging to have the right mid ureteral stone. It was quite small and only 2-3 mm, bilateral papillary tip calcifications  noted.  He has had right flank pain for quite some time and also some irritative voiding and small hematuria. It was felt that his urolithiasis was likely contributory to this.  Options discussed for management including medical therapy alone versus  shockwave lithotripsy versus ureteroscopy unilateral versus bilateral and he wished to proceed with bilateral ureteroscopy with goal of stone free and to complete hematuria evaluation.  Informed consent was obtained and placed in medical record.  PROCEDURE IN DETAIL:  The patient being himself verified, procedure being bilateral ureteroscopic stone manipulation was  confirmed.  Procedure timeout was performed.  Intravenous antibiotics were administered.  General LMA anesthesia introduced.  The  patient was placed into a low lithotomy position.  Sterile field was created, prepped and draped the patient's penis, perineum, and proximal thighs using iodine.  Cystourethroscopy was performed using 21-French rigid cystoscope with offset lens.   Inspection of anterior and posterior urethra were unremarkable.  Inspection of urinary bladder revealed a crowning tight ureteral stone at the right ureteral orifice, some mild edema, otherwise unremarkable.  No papillary lesions noted.  The right  ureteral orifice was cannulated with a 6-French open-ended catheter, and right retrograde pyelogram was obtained.  Right retrograde pyelogram demonstrated single right ureter, single system right kidney.  There was mild hydronephrosis to a mobile filling defect in distal ureter consistent with ureterovesical known stone.  A 0.038 ZIPwire was advanced to the level of  lower pole, set aside as a safety wire.  Next, left retrograde pyelogram was obtained.  Left retrograde pyelogram demonstrated single left ureter, single system left kidney.  No filling defects or narrowing noted and a separate ZIPwire was advanced to the level of the pole, set aside as a safety wire.  An 8-French feeding tube placed in the  urinary bladder for pressure release.  Semirigid ureteroscopy was then performed to distal four-fifths of the left ureter alongside a separate sensor working wire.  No mucosal abnormalities found.  Next, semirigid ureteroscopy was performed on the right  distal ureter alongside a separate sensor working wire.  In the distal ureter just above the interim ureter was the distal stone in question having been slightly retrograde positioned. It was quite fusiform.  It did appear amenable to an attempt at  simple basketing and the stone was grasped on its long axis with  an Escape basket and  navigated out in its entirety, set aside for composition analysis.  Repeat semirigid ureteroscopy of the right side of the distal orifice right ureter revealed no  additional calcifications or mucosal abnormalities.  As the goal today was to render stone free, the semirigid scope was exchanged for a medium length ureteral access sheath to the level of proximal ureter using continuous fluoroscopic guidance and  flexible digital ureteroscopy was performed of the proximal right ureter and systematic inspection of the right kidney including all calices x3.  There was multifocal, very small papillary tip calcifications noted, one dominant free floating stone noted  that was also amenable to simple basketing.  It was removed and set aside for composition analysis.  There was no obvious additional free floating stone or where the stone was amenable to manipulation seen on the right side.  Access sheath was removed  under continuous vision, no significant mucosal abnormalities were found.  Next, the access sheath was placed over the left sensor working wire to the level of proximal left ureter using continuous fluoroscopic guidance and flexible digital ureteroscopy  was performed of proximal left ureter and systematic inspection of left kidney including all calices x3.  There were 2 very small papillary tip calcifications noted.  These were estimated to be approximately 1 mm or so in diameter.  These were amenable  to simple manual dislodgement with the Escape basket in a closed fashion, allowing these very small fragments to irrigate free.  There were no additional significant calcifications on the left side.  Access sheath was removed under continuous vision, no  significant mucosal abnormalities were found.  Given bilateral access sheath usage, it was felt that brief interval stenting with tethered stent would be most prudent.  As such, bilateral Polaris type stents, 5 x 24-French were placed over remaining   safety wire using fluoroscopic guidance.  Good proximal and distal narrowing noted.  Tether was left in place, trimmed to length, fashioned to the dorsum of penis.  The procedure was terminated.  The patient tolerated the procedure well, no immediate  periprocedural complications.  The patient was taken to [facility] in stable condition with plan for discharge home.    PAA D: 02/21/2022 2:58:41 pm T: 02/22/2022 12:26:00 am  JOB: [id number]/ [id number]

## 2022-02-22 NOTE — Telephone Encounter (Signed)
Yes, it will be okay for him to have PFTs post procedure, as long as he recovers well. Thanks.

## 2022-02-25 DIAGNOSIS — N201 Calculus of ureter: Secondary | ICD-10-CM | POA: Diagnosis not present

## 2022-02-27 ENCOUNTER — Ambulatory Visit: Payer: 59 | Admitting: Nurse Practitioner

## 2022-02-27 ENCOUNTER — Ambulatory Visit (INDEPENDENT_AMBULATORY_CARE_PROVIDER_SITE_OTHER): Payer: 59 | Admitting: Pulmonary Disease

## 2022-02-27 ENCOUNTER — Encounter: Payer: Self-pay | Admitting: Nurse Practitioner

## 2022-02-27 VITALS — BP 124/72 | HR 92 | Ht 68.0 in | Wt 232.8 lb

## 2022-02-27 DIAGNOSIS — R911 Solitary pulmonary nodule: Secondary | ICD-10-CM | POA: Diagnosis not present

## 2022-02-27 DIAGNOSIS — Z720 Tobacco use: Secondary | ICD-10-CM

## 2022-02-27 DIAGNOSIS — F17210 Nicotine dependence, cigarettes, uncomplicated: Secondary | ICD-10-CM | POA: Diagnosis not present

## 2022-02-27 LAB — PULMONARY FUNCTION TEST
DL/VA % pred: 106 %
DL/VA: 4.53 ml/min/mmHg/L
DLCO cor % pred: 119 %
DLCO cor: 30.96 ml/min/mmHg
DLCO unc % pred: 119 %
DLCO unc: 30.96 ml/min/mmHg
FEF 25-75 Post: 2.79 L/sec
FEF 25-75 Pre: 2.03 L/sec
FEF2575-%Change-Post: 37 %
FEF2575-%Pred-Post: 99 %
FEF2575-%Pred-Pre: 72 %
FEV1-%Change-Post: 8 %
FEV1-%Pred-Post: 91 %
FEV1-%Pred-Pre: 84 %
FEV1-Post: 3.07 L
FEV1-Pre: 2.84 L
FEV1FVC-%Change-Post: 0 %
FEV1FVC-%Pred-Pre: 95 %
FEV6-%Change-Post: 6 %
FEV6-%Pred-Post: 98 %
FEV6-%Pred-Pre: 92 %
FEV6-Post: 4.18 L
FEV6-Pre: 3.92 L
FEV6FVC-%Change-Post: -1 %
FEV6FVC-%Pred-Post: 102 %
FEV6FVC-%Pred-Pre: 104 %
FVC-%Change-Post: 8 %
FVC-%Pred-Post: 96 %
FVC-%Pred-Pre: 88 %
FVC-Post: 4.27 L
FVC-Pre: 3.93 L
Post FEV1/FVC ratio: 72 %
Post FEV6/FVC ratio: 98 %
Pre FEV1/FVC ratio: 72 %
Pre FEV6/FVC Ratio: 100 %
RV % pred: 147 %
RV: 3.14 L
TLC % pred: 125 %
TLC: 8.29 L

## 2022-02-27 NOTE — Assessment & Plan Note (Signed)
Actively working on quitting.   The patient's current tobacco use: 3-5 cigarettes/day The patient was advised to quit and impact of smoking on their health.  I assessed the patient's willingness to attempt to quit. I provided methods and skills for cessation. We reviewed medication management of smoking session drugs if appropriate. Resources to help quit smoking were provided. A smoking cessation quit date was set: 03/13/2022 Follow-up was arranged in our clinic.  The amount of time spent counseling patient was 4 mins

## 2022-02-27 NOTE — Progress Notes (Signed)
Full PFT Performed Today  

## 2022-02-27 NOTE — Patient Instructions (Signed)
Full PFT Performed Today  

## 2022-02-27 NOTE — Patient Instructions (Signed)
Pulmonary function testing today was normal   I will discuss next steps with Dr. Valeta Harms and call you beginning of next week. We will likely move forward with a bronchoscopy to obtain biopsy of the nodule and then refer you to thoracic surgery for possible surgical resection.   Quit smoking! You can do it :)  We will schedule follow up once we determine timing of your upcoming procedures. If symptoms worsen, please contact office for sooner follow up or seek emergency care.

## 2022-02-27 NOTE — Progress Notes (Signed)
_0  ID: Javier Vega, male    DOB: 1960/09/24, 61 y.o.   MRN: [id number]  Chief Complaint  Patient presents with   Follow-up    Pt f/u to discuss PFT results, breathing seems to be fine    Referring provider: Lajean Manes, MD  HPI: 61 year old male, active smoker followed for lung nodule.  He is a patient of Dr. Juline Vega and last seen in office 01/23/2022 for initial consult.  Past medical history significant for hypertension, maxillary sinusitis, OSA intolerant of CPAP, HLD, obesity.  TEST/EVENTS:  01/08/2022 LDCT chest: atherosclerosis. No LAD. Mild centrilobular emphysema. Smoking related bronchiolitis. Spiculated nodular consolidation in the LLL, 17.1 mm increased from 4.9. associated focal mucoid impaction. Calcified granulomas. Tiny stones in left kidney. 02/04/2022 PET scan: Low-level hypermetabolism to the spiculated left lower lobe nodule, suspicious for low-grade malignancy.  No evidence of distant metastatic disease.  No thoracic adenopathy there is a right-sided obstructive uropathy secondary to 2 mm stone in the proximal ureter 02/27/2022 PFT: FVC 88, FEV1 91, ratio 72, TLC 125, DLCOcor 119.  No BD  01/23/2022: OV with Dr. Valeta Vega. He had lung cancer screening CT 01/08/2022, there was a spiculated nodule or consolidation in the left lower lobe measuring 17.1 mm, previously 4.9.  Concerning for potential malignancy.  He was scheduled for PET scan and pulmonary function testing for further evaluation.  May be a good candidate for combination procedure with CT surgery.  Plan for follow-up after to discuss results and neck steps.  Actively trying to smoke.  Down to 4 to 6 cigarettes a day.  02/27/2022: Today-follow-up Patient presents today for pulmonary function testing follow-up.  PFTs were overall normal.  Feeling well since he was here last.  Had a cystoscopy, bilateral ureteroscopy and insertion of bilateral ureteral stents on 11/2.  He has recovered well from this.  Still having  some soreness but seems to be improving.  He denies any weight loss or recent hemoptysis.  No difficulties with his breathing.  He continues to smoke 3 to 5 cigarettes a day.  Actively working on quitting.  Understands the importance.  Allergies  Allergen Reactions   Crestor [Rosuvastatin] Other (See Comments)   Sulfa Antibiotics Rash    unknown    Immunization History  Administered Date(s) Administered   Influenza, High Dose Seasonal PF 06/11/2021   Influenza-Unspecified 01/08/2017   MMR 12/21/2005   PFIZER(Purple Top)SARS-COV-2 Vaccination 06/11/2021   Pneumococcal-Unspecified 04/22/2009   Tdap 12/21/2005    Past Medical History:  Diagnosis Date   Aortic atherosclerosis ([place])    Arthritis    Bilateral ureteral calculi    Centrilobular emphysema ([place])    followed by dr icard   Chronic anal fissure    Dyspnea    cardiology evaluation by dr t. Radford Pax , lov note 02-20-2021;  coroary CT FFA and echo normal   Fatty liver    GERD (gastroesophageal reflux disease)    Hemorrhoids    History of kidney stones    History of tuberculosis exposure    Hx of adenomatous colonic polyps    Hyperlipidemia    Hypertension    followed by pcp   ([phone]  pt stated monitored by pcp and pt stated checks bp at home,  no medication for 3 months, was taking norvasc 2.35m )   OSA (obstructive sleep apnea)    study in epic 07-11-2014  moderate osa (AHI 15.2/hr),  intolerant to cpap   Pulmonary nodule    pulmonologist--- dr iValeta Vega  RBBB (right bundle branch block)    Renal calculus, bilateral    Seasonal allergic rhinitis    Vitamin D deficiency    Weak urinary stream    Wears glasses     Tobacco History: Social History   Tobacco Use  Smoking Status Every Day   Packs/day: 0.50   Years: 45.00   Total pack years: 22.50   Types: Cigarettes  Smokeless Tobacco Never  Tobacco Comments   02-19-2022  pt stated smoking since age 40,  currently smoking 3 per day   Ready to quit: Not  Answered Counseling given: Not Answered Tobacco comments: 02-19-2022  pt stated smoking since age 83,  currently smoking 3 per day   Outpatient Medications Prior to Visit  Medication Sig Dispense Refill   acetaminophen (TYLENOL) 650 MG CR tablet Take 1,300 mg by mouth daily at 6 (six) AM.     Ascorbic Acid (VITAMIN C) 500 MG CAPS Take 1 capsule by mouth 3 (three) times a week.     atorvastatin (LIPITOR) 80 MG tablet Take 1 tablet (80 mg total) by mouth daily. (Patient taking differently: Take 80 mg by mouth daily.) 90 tablet 3   azelastine (OPTIVAR) 0.05 % ophthalmic solution Apply 1 drop to affected eye 2 (two) times daily. (Patient taking differently: Apply 1 drop to eye 2 (two) times daily as needed.) 6 mL 3   calcium carbonate (TUMS - DOSED IN MG ELEMENTAL CALCIUM) 500 MG chewable tablet Chew 1 tablet by mouth as needed for indigestion or heartburn.     cholecalciferol (VITAMIN D3) 25 MCG (1000 UT) tablet Take 1,000 Units by mouth daily.     ketorolac (TORADOL) 10 MG tablet Take 1 tablet (10 mg total) by mouth every 8 (eight) hours as needed for moderate pain (or stent discomfort post-operatively). 20 tablet 0   naproxen sodium (ALEVE) 220 MG tablet Take 220 mg by mouth as needed.     oxyCODONE-acetaminophen (PERCOCET/ROXICET) 5-325 MG tablet Take 1 tablet by mouth every 6 (six) hours as needed for severe pain (post-operatively). 15 tablet 0   oxymetazoline (AFRIN) 0.05 % nasal spray Place 1 spray into both nostrils 2 (two) times daily.     predniSONE (DELTASONE) 20 MG tablet Take 0.5 tablets (10 mg total) by mouth daily as needed for arthritic pain (Patient taking differently: Take 10 mg by mouth daily as needed. As directed by provider take arthritic pain 1/2 as needed) 60 tablet 2   senna-docusate (SENOKOT-S) 8.6-50 MG tablet Take 1 tablet by mouth 2 (two) times daily. While taking strongest pain meds to prevent constipation 10 tablet 0   tamsulosin (FLOMAX) 0.4 MG CAPS capsule Take 0.4 mg  by mouth daily.     No facility-administered medications prior to visit.     Review of Systems:   Constitutional: No weight loss or gain, night sweats, fevers, chills, fatigue, or lassitude. HEENT: No headaches, difficulty swallowing, tooth/dental problems, or sore throat. No sneezing, itching, ear ache, nasal congestion, or post nasal drip CV:  No chest pain, orthopnea, PND, swelling in lower extremities, anasarca, dizziness, palpitations, syncope Resp: No shortness of breath with exertion or at rest. No excess mucus or change in color of mucus. No productive or non-productive. No hemoptysis. No wheezing.  No chest wall deformity GI:  No heartburn, indigestion, abdominal pain, nausea, vomiting, diarrhea, change in bowel habits, loss of appetite, bloody stools.  GU: No dysuria, change in color of urine, hematuria  Neuro: No dizziness or lightheadedness.  Psych: No  depression or anxiety. Mood stable.     Physical Exam:  BP 124/72   Pulse 92   Ht _0  (1.727 m)   Wt 232 lb 12.8 oz (105.6 kg)   SpO2 97%   BMI 35.40 kg/m   GEN: Pleasant, interactive, well-appearing; obese; in no acute distress. HEENT:  Normocephalic and atraumatic. PERRLA. Sclera white. Nasal turbinates pink, moist and patent bilaterally. No rhinorrhea present. Oropharynx pink and moist, without exudate or edema. No lesions, ulcerations, or postnasal drip.  NECK:  Supple w/ fair ROM. No JVD present. Normal carotid impulses w/o bruits. Thyroid symmetrical with no goiter or nodules palpated. No lymphadenopathy.   CV: RRR, no m/r/g, no peripheral edema. Pulses intact, +2 bilaterally. No cyanosis, pallor or clubbing. PULMONARY:  Unlabored, regular breathing. Clear bilaterally A&P w/o wheezes/rales/rhonchi. No accessory muscle use.  GI: BS present and normoactive. Soft, non-tender to palpation. No organomegaly or masses detected.  MSK: No erythema, warmth or tenderness. Cap refil <2 sec all extrem. No deformities or joint  swelling noted.  Neuro: A/Ox3. No focal deficits noted.   Skin: Warm, no lesions or rashe Psych: Normal affect and behavior. Judgement and thought content appropriate.     Lab Results:  CBC    Component Value Date/Time   WBC 16.9 (H) 02/25/2021 0630   RBC 5.44 02/25/2021 0630   HGB 17.0 02/21/2022 1245   HCT 50.0 02/21/2022 1245   PLT 242 02/25/2021 0630   MCV 91.0 02/25/2021 0630   MCH 31.3 02/25/2021 0630   MCHC 34.3 02/25/2021 0630   RDW 13.1 02/25/2021 0630   LYMPHSABS 1.4 02/25/2021 0630   MONOABS 1.8 (H) 02/25/2021 0630   EOSABS 0.0 02/25/2021 0630   BASOSABS 0.1 02/25/2021 0630    BMET    Component Value Date/Time   NA 142 02/21/2022 1245   NA 138 02/20/2021 1017   K 3.7 02/21/2022 1245   CL 103 02/21/2022 1245   CO2 24 02/25/2021 0630   GLUCOSE 100 (H) 02/21/2022 1245   BUN 24 (H) 02/21/2022 1245   BUN 20 02/20/2021 1017   CREATININE 0.90 02/21/2022 1245   CREATININE 0.94 06/17/2017 1548   CALCIUM 9.8 02/25/2021 0630   GFRNONAA >60 02/25/2021 0630   GFRNONAA 90 06/17/2017 1548   GFRAA >60 11/30/2018 1611   GFRAA 105 06/17/2017 1548    BNP No results found for: "BNP"   Imaging:  NM PET Image Initial (PI) Skull Base To Thigh (F-18 FDG)  Result Date: 02/05/2022 CLINICAL DATA:  Initial treatment strategy for pulmonary nodule. EXAM: NUCLEAR MEDICINE PET SKULL BASE TO THIGH TECHNIQUE: 10.7 mCi F-18 FDG was injected intravenously. Full-ring PET imaging was performed from the skull base to thigh after the radiotracer. CT data was obtained and used for attenuation correction and anatomic localization. Fasting blood glucose: 129 mg/dl COMPARISON:  Multiple priors including chest CT January 08, 2022 and CT abdomen pelvis February 25, 2021 FINDINGS: Mediastinal blood pool activity: SUV max 2.5 Liver activity: SUV max NA NECK: No hypermetabolic cervical adenopathy. Incidental CT findings: Carotid artery calcifications. CHEST: Low-level FDG avidity in the spiculated  nodular left lower lobe consolidation which measures 17 x 9 mm on image 49/7 with a max SUV of 2.0. No hypermetabolic thoracic adenopathy. Incidental CT findings: Aortic atherosclerosis. Coronary artery calcifications. ABDOMEN/PELVIS: No abnormal hypermetabolic activity within the liver, pancreas, adrenal glands, or spleen. No hypermetabolic lymph nodes in the abdomen or pelvis. Mild right-sided hydronephrosis to a 2 mm stone in the proximal ureter on image  148/4 with delayed excretion of radiotracer from the right kidney. Incidental CT findings: Additional nonobstructive punctate bilateral renal stones. Aortic atherosclerosis. SKELETON: No focal hypermetabolic activity to suggest skeletal metastasis. Incidental CT findings: None. IMPRESSION: 1. Low-level FDG avidity in the 17 mm spiculated left lower lobe pulmonary nodule, which is nonspecific but given its morphology and slow interval increase in size is suspicious for low-grade neoplasm. Consider further evaluation with direct tissue sampling. 2. No hypermetabolic thoracic adenopathy or distant metastatic disease. 3. Right-sided obstructive uropathy secondary to a 2 mm stone in the proximal ureter. 4.  Aortic Atherosclerosis (ICD10-I70.0). Electronically Signed   By: Dahlia Bailiff M.D.   On: 02/05/2022 15:12         Latest Ref Rng & Units 02/27/2022   10:22 AM  PFT Results  FVC-Pre L 3.93  P  FVC-Predicted Pre % 88  P  FVC-Post L 4.27  P  FVC-Predicted Post % 96  P  Pre FEV1/FVC % % 72  P  Post FEV1/FCV % % 72  P  FEV1-Pre L 2.84  P  FEV1-Predicted Pre % 84  P  FEV1-Post L 3.07  P  DLCO uncorrected ml/min/mmHg 30.96  P  DLCO UNC% % 119  P  DLCO corrected ml/min/mmHg 30.96  P  DLCO COR %Predicted % 119  P  DLVA Predicted % 106  P  TLC L 8.29  P  TLC % Predicted % 125  P  RV % Predicted % 147  P    P Preliminary result    No results found for: "NITRICOXIDE"      Assessment & Plan:   Lung nodule Highly suspicious for primary  lung malignancy. He underwent pulmonary function testing today, which was normal. He is actively working on quitting smoking. Previous notes reviewed - potential for combo case with Dr. Valeta Vega and Dr. Kipp Brood. Will discuss with Dr. Valeta Vega prior to scheduling him for bronchoscopy. We did review risks/benefits today. Also discussed potential surgical resection. All questions answered. He is agreeable to move forward with procedure, as recommended by Dr. Valeta Vega.   Patient Instructions  Pulmonary function testing today was normal   I will discuss next steps with Dr. Valeta Vega and call you beginning of next week. We will likely move forward with a bronchoscopy to obtain biopsy of the nodule and then refer you to thoracic surgery for possible surgical resection.   Quit smoking! You can do it :)  We will schedule follow up once we determine timing of your upcoming procedures. If symptoms worsen, please contact office for sooner follow up or seek emergency care.    Tobacco use Actively working on quitting.   The patient's current tobacco use: 3-5 cigarettes/day The patient was advised to quit and impact of smoking on their health.  I assessed the patient's willingness to attempt to quit. I provided methods and skills for cessation. We reviewed medication management of smoking session drugs if appropriate. Resources to help quit smoking were provided. A smoking cessation quit date was set: 03/13/2022 Follow-up was arranged in our clinic.  The amount of time spent counseling patient was 4 mins    I spent 28 minutes of dedicated to the care of this patient on the date of this encounter to include pre-visit review of records, face-to-face time with the patient discussing conditions above, post visit ordering of testing, clinical documentation with the electronic health record, making appropriate referrals as documented, and communicating necessary findings to members of the patients care team.  Clayton Bibles, NP 02/27/2022  Pt aware and understands NP's role.

## 2022-02-27 NOTE — Assessment & Plan Note (Signed)
Highly suspicious for primary lung malignancy. He underwent pulmonary function testing today, which was normal. He is actively working on quitting smoking. Previous notes reviewed - potential for combo case with Dr. Valeta Harms and Dr. Kipp Brood. Will discuss with Dr. Valeta Harms prior to scheduling him for bronchoscopy. We did review risks/benefits today. Also discussed potential surgical resection. All questions answered. He is agreeable to move forward with procedure, as recommended by Dr. Valeta Harms.   Patient Instructions  Pulmonary function testing today was normal   I will discuss next steps with Dr. Valeta Harms and call you beginning of next week. We will likely move forward with a bronchoscopy to obtain biopsy of the nodule and then refer you to thoracic surgery for possible surgical resection.   Quit smoking! You can do it :)  We will schedule follow up once we determine timing of your upcoming procedures. If symptoms worsen, please contact office for sooner follow up or seek emergency care.

## 2022-03-08 ENCOUNTER — Encounter: Payer: Self-pay | Admitting: Nurse Practitioner

## 2022-03-09 NOTE — Progress Notes (Signed)
Patient being seen by [facility] in a few weeks  Thanks,  [place], DO [facility] 03/09/2022 5:25 PM

## 2022-03-12 DIAGNOSIS — N201 Calculus of ureter: Secondary | ICD-10-CM | POA: Diagnosis not present

## 2022-03-20 ENCOUNTER — Other Ambulatory Visit: Payer: Self-pay | Admitting: Urology

## 2022-03-20 ENCOUNTER — Other Ambulatory Visit (HOSPITAL_COMMUNITY): Payer: Self-pay

## 2022-03-21 ENCOUNTER — Other Ambulatory Visit (HOSPITAL_COMMUNITY): Payer: Self-pay

## 2022-03-26 NOTE — Progress Notes (Unsigned)
[address]       [place] [address]             [phone]                    Javier Vega [place] Medical Record #[id number] Date of Birth: 03-Oct-1960  Referring: Garner Nash, DO Primary Care: Kathalene Frames, MD Primary Cardiologist: Fransico Him, MD  Chief Complaint:    Chief Complaint  Patient presents with   Lung Lesion    Surgical consult, PET Scan 02/04/22/ Chest CT 01/08/22/ PFT's 02/27/22    History of Present Illness:    Javier Vega 61 y.o. male presents for surgical evaluation of a 1.7 cm left lower lobe pulmonary nodule.  He is a current smoker and this was originally identified in the lung cancer screening program.  He denies any shortness of breath or coughs.  He also denies any weight loss or neurologic symptoms.    Zubrod Score: At the time of surgery this patient's most appropriate activity status/level should be described as: [x]     0    Normal activity, no symptoms []     1    Restricted in physical strenuous activity but ambulatory, able to do out light work []     2    Ambulatory and capable of self care, unable to do work activities, up and about               >50 % of waking hours                              []     3    Only limited self care, in bed greater than 50% of waking hours []     4    Completely disabled, no self care, confined to bed or chair []     5    Moribund   Past Medical History:  Diagnosis Date   Aortic atherosclerosis ([place])    Arthritis    Bilateral ureteral calculi    Centrilobular emphysema ([place])    followed by dr icard   Chronic anal fissure    Dyspnea    cardiology evaluation by dr t. Radford Pax , lov note 02-20-2021;  coroary CT FFA and echo normal   Fatty liver    GERD (gastroesophageal reflux disease)    Hemorrhoids    History of kidney stones    History of tuberculosis exposure    Hx of adenomatous colonic polyps    Hyperlipidemia    Hypertension    followed by pcp   ([phone]   pt stated monitored by pcp and pt stated checks bp at home,  no medication for 3 months, was taking norvasc 2.5mg  )   OSA (obstructive sleep apnea)    study in epic 07-11-2014  moderate osa (AHI 15.2/hr),  intolerant to cpap   Pulmonary nodule    pulmonologist--- dr icard   RBBB (right bundle branch block)    Renal calculus, bilateral    Seasonal allergic rhinitis    Vitamin D deficiency    Weak urinary stream    Wears glasses     Past Surgical History:  Procedure Laterality Date   COLONOSCOPY WITH PROPOFOL  2019   dr Silverio Decamp   CYSTOSCOPY WITH RETROGRADE PYELOGRAM, URETEROSCOPY AND STENT PLACEMENT Bilateral 02/21/2022   Procedure: CYSTOSCOPY WITH RETROGRADE PYELOGRAM, URETEROSCOPY, STONE BASKETTING AND STENT  PLACEMENT;  Surgeon: Alexis Frock, MD;  [facility];  Service: Urology;  Laterality: Bilateral;  1 HR   EXTRACORPOREAL SHOCK WAVE LITHOTRIPSY Right 12/03/2018   Procedure: EXTRACORPOREAL SHOCK WAVE LITHOTRIPSY (ESWL);  Surgeon: Alexis Frock, MD;  Location: [facility];  Service: Urology;  Laterality: Right;  59 MINS   KNEE ARTHROSCOPY Right 2019   WISDOM TOOTH EXTRACTION      Family History  Adopted: Yes  Problem Relation Age of Onset   Colon cancer Neg Hx        Pt was adopted, does not know Fx   Colon polyps Neg Hx      Social History   Tobacco Use  Smoking Status Every Day   Packs/day: 0.50   Years: 45.00   Total pack years: 22.50   Types: Cigarettes  Smokeless Tobacco Never  Tobacco Comments   02-19-2022  pt stated smoking since age 61,  currently smoking 3 per day    Social History   Substance and Sexual Activity  Alcohol Use Not Currently   Comment: seldom     Allergies  Allergen Reactions   Crestor [Rosuvastatin] Other (See Comments)   Sulfa Antibiotics Rash    unknown    Current Outpatient Medications  Medication Sig Dispense Refill   acetaminophen (TYLENOL) 650 MG CR tablet Take 1,300 mg by mouth daily at 6 (six)  AM.     atorvastatin (LIPITOR) 80 MG tablet Take 1 tablet (80 mg total) by mouth daily. (Patient taking differently: Take 80 mg by mouth daily.) 90 tablet 3   calcium carbonate (TUMS - DOSED IN MG ELEMENTAL CALCIUM) 500 MG chewable tablet Chew 1 tablet by mouth as needed for indigestion or heartburn.     naproxen sodium (ALEVE) 220 MG tablet Take 220 mg by mouth as needed.     oxymetazoline (AFRIN) 0.05 % nasal spray Place 1 spray into both nostrils 2 (two) times daily.     predniSONE (DELTASONE) 20 MG tablet Take 0.5 tablets (10 mg total) by mouth daily as needed for arthritic pain (Patient taking differently: Take 10 mg by mouth daily as needed. As directed by provider take arthritic pain 1/2 as needed) 60 tablet 2   Ascorbic Acid (VITAMIN C) 500 MG CAPS Take 1 capsule by mouth 3 (three) times a week.     azelastine (OPTIVAR) 0.05 % ophthalmic solution Apply 1 drop to affected eye 2 (two) times daily. (Patient taking differently: Apply 1 drop to eye 2 (two) times daily as needed.) 6 mL 3   cholecalciferol (VITAMIN D3) 25 MCG (1000 UT) tablet Take 1,000 Units by mouth daily.     ketorolac (TORADOL) 10 MG tablet Take 1 tablet (10 mg total) by mouth every 8 (eight) hours as needed for moderate pain (or stent discomfort post-operatively). (Patient not taking: Reported on 03/28/2022) 20 tablet 0   oxyCODONE-acetaminophen (PERCOCET/ROXICET) 5-325 MG tablet Take 1 tablet by mouth every 6 (six) hours as needed for severe pain (post-operatively). (Patient not taking: Reported on 03/28/2022) 15 tablet 0   senna-docusate (SENOKOT-S) 8.6-50 MG tablet Take 1 tablet by mouth 2 (two) times daily. While taking strongest pain meds to prevent constipation (Patient not taking: Reported on 03/28/2022) 10 tablet 0   tamsulosin (FLOMAX) 0.4 MG CAPS capsule Take 0.4 mg by mouth daily. (Patient not taking: Reported on 03/28/2022)     No current facility-administered medications for this visit.    Review of Systems   Constitutional:  Negative for fever and malaise/fatigue.  Respiratory:  Negative for cough and shortness of breath.   Cardiovascular:  Negative for chest pain.  Neurological: Negative.      PHYSICAL EXAMINATION: BP (!) 143/67   Pulse 79   Resp 20   Ht 5\' 8"  (1.727 m)   Wt 232 lb (105.2 kg)   SpO2 95% Comment: RA  BMI 35.28 kg/m  Physical Exam Constitutional:      Appearance: Normal appearance. He is normal weight. He is not ill-appearing.  HENT:     Head: Normocephalic and atraumatic.  Eyes:     Extraocular Movements: Extraocular movements intact.  Cardiovascular:     Rate and Rhythm: Normal rate.  Pulmonary:     Effort: Pulmonary effort is normal. No respiratory distress.  Musculoskeletal:        General: Normal range of motion.     Cervical back: Normal range of motion.  Skin:    General: Skin is warm and dry.  Neurological:     General: No focal deficit present.     Mental Status: He is alert and oriented to person, place, and time.     Diagnostic Studies & Laboratory data:     Recent Radiology Findings:   No results found.     I have independently reviewed the above radiology studies  and reviewed the findings with the patient.   Recent Lab Findings: Lab Results  Component Value Date   WBC 16.9 (H) 02/25/2021   HGB 17.0 02/21/2022   HCT 50.0 02/21/2022   PLT 242 02/25/2021   GLUCOSE 100 (H) 02/21/2022   CHOL 137 03/07/2021   TRIG 134 03/07/2021   HDL 44 03/07/2021   LDLCALC 69 03/07/2021   ALT 31 03/07/2021   AST 17 02/25/2021   NA 142 02/21/2022   K 3.7 02/21/2022   CL 103 02/21/2022   CREATININE 0.90 02/21/2022   BUN 24 (H) 02/21/2022   CO2 24 02/25/2021   TSH 1.410 02/20/2021   HGBA1C 5.5 03/12/2017     PFTs:  - FVC: 88% - FEV1: 84% -DLCO: 119%    Assessment / Plan:   61 yo male with 1.7cm LLL pulm nodule.  We discussed the importance of smoking cessation, the patient states that he will stop today.  On review of the imaging  the nodule in his peripheral and looks to abut the visceral pleura.  Think that this would be amenable to surgical biopsy, and if positive proceed with a lobectomy.  He is in agreement this plan.  He will require a stress test prior to surgery.  He is tentatively scheduled for a left robotic assisted thoracoscopy with wedge resection and possible left lower lobectomy.      I  spent 55 minutes with  the patient face to face in counseling and coordination of care.    Lajuana Matte 03/28/2022 12:52 PM

## 2022-03-26 NOTE — H&P (View-Only) (Signed)
[address]       [place] [address]             [phone]                    Javier Vega [facility] Medical Record #[id number] Date of Birth: 12/07/1960  Referring: Garner Nash, DO Primary Care: Kathalene Frames, MD Primary Cardiologist: Fransico Him, MD  Chief Complaint:    Chief Complaint  Patient presents with   Lung Lesion    Surgical consult, PET Scan 02/04/22/ Chest CT 01/08/22/ PFT's 02/27/22    History of Present Illness:    Javier Vega 61 y.o. male presents for surgical evaluation of a 1.7 cm left lower lobe pulmonary nodule.  He is a current smoker and this was originally identified in the lung cancer screening program.  He denies any shortness of breath or coughs.  He also denies any weight loss or neurologic symptoms.    Zubrod Score: At the time of surgery this patient's most appropriate activity status/level should be described as: [x]     0    Normal activity, no symptoms []     1    Restricted in physical strenuous activity but ambulatory, able to do out light work []     2    Ambulatory and capable of self care, unable to do work activities, up and about               >50 % of waking hours                              []     3    Only limited self care, in bed greater than 50% of waking hours []     4    Completely disabled, no self care, confined to bed or chair []     5    Moribund   Past Medical History:  Diagnosis Date   Aortic atherosclerosis ([place])    Arthritis    Bilateral ureteral calculi    Centrilobular emphysema ([place])    followed by dr icard   Chronic anal fissure    Dyspnea    cardiology evaluation by dr t. Radford Pax , lov note 02-20-2021;  coroary CT FFA and echo normal   Fatty liver    GERD (gastroesophageal reflux disease)    Hemorrhoids    History of kidney stones    History of tuberculosis exposure    Hx of adenomatous colonic polyps    Hyperlipidemia    Hypertension    followed by pcp   [phone]   pt stated monitored by pcp and pt stated checks bp at home,  no medication for 3 months, was taking norvasc 2.5mg  )   OSA (obstructive sleep apnea)    study in epic 07-11-2014  moderate osa (AHI 15.2/hr),  intolerant to cpap   Pulmonary nodule    pulmonologist--- dr icard   RBBB (right bundle branch block)    Renal calculus, bilateral    Seasonal allergic rhinitis    Vitamin D deficiency    Weak urinary stream    Wears glasses     Past Surgical History:  Procedure Laterality Date   COLONOSCOPY WITH PROPOFOL  2019   dr Silverio Decamp   CYSTOSCOPY WITH RETROGRADE PYELOGRAM, URETEROSCOPY AND STENT PLACEMENT Bilateral 02/21/2022   Procedure: CYSTOSCOPY WITH RETROGRADE PYELOGRAM, URETEROSCOPY, STONE BASKETTING AND STENT  PLACEMENT;  Surgeon: Alexis Frock, MD;  Location: [facility];  Service: Urology;  Laterality: Bilateral;  1 HR   EXTRACORPOREAL SHOCK WAVE LITHOTRIPSY Right 12/03/2018   Procedure: EXTRACORPOREAL SHOCK WAVE LITHOTRIPSY (ESWL);  Surgeon: Alexis Frock, MD;  Location: [facility];  Service: Urology;  Laterality: Right;  78 MINS   KNEE ARTHROSCOPY Right 2019   WISDOM TOOTH EXTRACTION      Family History  Adopted: Yes  Problem Relation Age of Onset   Colon cancer Neg Hx        Pt was adopted, does not know Fx   Colon polyps Neg Hx      Social History   Tobacco Use  Smoking Status Every Day   Packs/day: 0.50   Years: 45.00   Total pack years: 22.50   Types: Cigarettes  Smokeless Tobacco Never  Tobacco Comments   02-19-2022  pt stated smoking since age 36,  currently smoking 3 per day    Social History   Substance and Sexual Activity  Alcohol Use Not Currently   Comment: seldom     Allergies  Allergen Reactions   Crestor [Rosuvastatin] Other (See Comments)   Sulfa Antibiotics Rash    unknown    Current Outpatient Medications  Medication Sig Dispense Refill   acetaminophen (TYLENOL) 650 MG CR tablet Take 1,300 mg by mouth daily at 6 (six)  AM.     atorvastatin (LIPITOR) 80 MG tablet Take 1 tablet (80 mg total) by mouth daily. (Patient taking differently: Take 80 mg by mouth daily.) 90 tablet 3   calcium carbonate (TUMS - DOSED IN MG ELEMENTAL CALCIUM) 500 MG chewable tablet Chew 1 tablet by mouth as needed for indigestion or heartburn.     naproxen sodium (ALEVE) 220 MG tablet Take 220 mg by mouth as needed.     oxymetazoline (AFRIN) 0.05 % nasal spray Place 1 spray into both nostrils 2 (two) times daily.     predniSONE (DELTASONE) 20 MG tablet Take 0.5 tablets (10 mg total) by mouth daily as needed for arthritic pain (Patient taking differently: Take 10 mg by mouth daily as needed. As directed by provider take arthritic pain 1/2 as needed) 60 tablet 2   Ascorbic Acid (VITAMIN C) 500 MG CAPS Take 1 capsule by mouth 3 (three) times a week.     azelastine (OPTIVAR) 0.05 % ophthalmic solution Apply 1 drop to affected eye 2 (two) times daily. (Patient taking differently: Apply 1 drop to eye 2 (two) times daily as needed.) 6 mL 3   cholecalciferol (VITAMIN D3) 25 MCG (1000 UT) tablet Take 1,000 Units by mouth daily.     ketorolac (TORADOL) 10 MG tablet Take 1 tablet (10 mg total) by mouth every 8 (eight) hours as needed for moderate pain (or stent discomfort post-operatively). (Patient not taking: Reported on 03/28/2022) 20 tablet 0   oxyCODONE-acetaminophen (PERCOCET/ROXICET) 5-325 MG tablet Take 1 tablet by mouth every 6 (six) hours as needed for severe pain (post-operatively). (Patient not taking: Reported on 03/28/2022) 15 tablet 0   senna-docusate (SENOKOT-S) 8.6-50 MG tablet Take 1 tablet by mouth 2 (two) times daily. While taking strongest pain meds to prevent constipation (Patient not taking: Reported on 03/28/2022) 10 tablet 0   tamsulosin (FLOMAX) 0.4 MG CAPS capsule Take 0.4 mg by mouth daily. (Patient not taking: Reported on 03/28/2022)     No current facility-administered medications for this visit.    Review of Systems   Constitutional:  Negative for fever and malaise/fatigue.  Respiratory:  Negative for cough and shortness of breath.   Cardiovascular:  Negative for chest pain.  Neurological: Negative.      PHYSICAL EXAMINATION: BP (!) 143/67   Pulse 79   Resp 20   Ht 5\' 8"  (1.727 m)   Wt 232 lb (105.2 kg)   SpO2 95% Comment: RA  BMI 35.28 kg/m  Physical Exam Constitutional:      Appearance: Normal appearance. He is normal weight. He is not ill-appearing.  HENT:     Head: Normocephalic and atraumatic.  Eyes:     Extraocular Movements: Extraocular movements intact.  Cardiovascular:     Rate and Rhythm: Normal rate.  Pulmonary:     Effort: Pulmonary effort is normal. No respiratory distress.  Musculoskeletal:        General: Normal range of motion.     Cervical back: Normal range of motion.  Skin:    General: Skin is warm and dry.  Neurological:     General: No focal deficit present.     Mental Status: He is alert and oriented to person, place, and time.     Diagnostic Studies & Laboratory data:     Recent Radiology Findings:   No results found.     I have independently reviewed the above radiology studies  and reviewed the findings with the patient.   Recent Lab Findings: Lab Results  Component Value Date   WBC 16.9 (H) 02/25/2021   HGB 17.0 02/21/2022   HCT 50.0 02/21/2022   PLT 242 02/25/2021   GLUCOSE 100 (H) 02/21/2022   CHOL 137 03/07/2021   TRIG 134 03/07/2021   HDL 44 03/07/2021   LDLCALC 69 03/07/2021   ALT 31 03/07/2021   AST 17 02/25/2021   NA 142 02/21/2022   K 3.7 02/21/2022   CL 103 02/21/2022   CREATININE 0.90 02/21/2022   BUN 24 (H) 02/21/2022   CO2 24 02/25/2021   TSH 1.410 02/20/2021   HGBA1C 5.5 03/12/2017     PFTs:  - FVC: 88% - FEV1: 84% -DLCO: 119%    Assessment / Plan:   61 yo male with 1.7cm LLL pulm nodule.  We discussed the importance of smoking cessation, the patient states that he will stop today.  On review of the imaging  the nodule in his peripheral and looks to abut the visceral pleura.  Think that this would be amenable to surgical biopsy, and if positive proceed with a lobectomy.  He is in agreement this plan.  He will require a stress test prior to surgery.  He is tentatively scheduled for a left robotic assisted thoracoscopy with wedge resection and possible left lower lobectomy.      I  spent 55 minutes with  the patient face to face in counseling and coordination of care.    Lajuana Matte 03/28/2022 12:52 PM

## 2022-03-28 ENCOUNTER — Other Ambulatory Visit: Payer: Self-pay | Admitting: Thoracic Surgery (Cardiothoracic Vascular Surgery)

## 2022-03-28 ENCOUNTER — Other Ambulatory Visit: Payer: Self-pay | Admitting: *Deleted

## 2022-03-28 ENCOUNTER — Institutional Professional Consult (permissible substitution): Payer: 59 | Admitting: Thoracic Surgery (Cardiothoracic Vascular Surgery)

## 2022-03-28 ENCOUNTER — Encounter: Payer: Self-pay | Admitting: *Deleted

## 2022-03-28 VITALS — BP 143/67 | HR 79 | Resp 20 | Ht 68.0 in | Wt 232.0 lb

## 2022-03-28 DIAGNOSIS — R911 Solitary pulmonary nodule: Secondary | ICD-10-CM

## 2022-03-28 DIAGNOSIS — J300 Vasomotor rhinitis: Secondary | ICD-10-CM | POA: Insufficient documentation

## 2022-03-28 DIAGNOSIS — H1045 Other chronic allergic conjunctivitis: Secondary | ICD-10-CM | POA: Insufficient documentation

## 2022-04-01 ENCOUNTER — Other Ambulatory Visit (HOSPITAL_COMMUNITY): Payer: Self-pay

## 2022-04-01 ENCOUNTER — Other Ambulatory Visit: Payer: Self-pay | Admitting: Thoracic Surgery (Cardiothoracic Vascular Surgery)

## 2022-04-01 ENCOUNTER — Telehealth (HOSPITAL_COMMUNITY): Payer: Self-pay | Admitting: *Deleted

## 2022-04-01 DIAGNOSIS — R911 Solitary pulmonary nodule: Secondary | ICD-10-CM

## 2022-04-01 NOTE — Telephone Encounter (Signed)
Patient given detailed instructions per Myocardial Perfusion Study Information Sheet for the test on 04/03/22 at 1245. Patient notified to arrive 15 minutes early and that it is imperative to arrive on time for appointment to keep from having the test rescheduled.  If you need to cancel or reschedule your appointment, please call the office within 24 hours of your appointment. . Patient verbalized understanding.Shiela Bruns, Ranae Palms

## 2022-04-03 ENCOUNTER — Ambulatory Visit (HOSPITAL_COMMUNITY): Payer: 59 | Attending: Thoracic Surgery (Cardiothoracic Vascular Surgery)

## 2022-04-03 DIAGNOSIS — R911 Solitary pulmonary nodule: Secondary | ICD-10-CM | POA: Insufficient documentation

## 2022-04-03 DIAGNOSIS — Z01810 Encounter for preprocedural cardiovascular examination: Secondary | ICD-10-CM

## 2022-04-03 LAB — MYOCARDIAL PERFUSION IMAGING
LV dias vol: 88 mL (ref 62–150)
LV sys vol: 28 mL
Nuc Stress EF: 68 %
Peak HR: 87 {beats}/min
Rest HR: 74 {beats}/min
Rest Nuclear Isotope Dose: 10.3 mCi
SDS: 0
SRS: 0
SSS: 0
ST Depression (mm): 0 mm
Stress Nuclear Isotope Dose: 30.1 mCi
TID: 1.01

## 2022-04-03 MED ORDER — TECHNETIUM TC 99M TETROFOSMIN IV KIT
10.3000 | PACK | Freq: Once | INTRAVENOUS | Status: AC | PRN
  Administered 2022-04-03: 10.3 via INTRAVENOUS

## 2022-04-03 MED ORDER — TECHNETIUM TC 99M TETROFOSMIN IV KIT
30.1000 | PACK | Freq: Once | INTRAVENOUS | Status: AC | PRN
  Administered 2022-04-03: 30.1 via INTRAVENOUS

## 2022-04-03 MED ORDER — REGADENOSON 0.4 MG/5ML IV SOLN
0.4000 mg | Freq: Once | INTRAVENOUS | Status: AC
  Administered 2022-04-03: 0.4 mg via INTRAVENOUS

## 2022-04-05 ENCOUNTER — Telehealth: Payer: 59 | Admitting: Thoracic Surgery (Cardiothoracic Vascular Surgery)

## 2022-04-05 ENCOUNTER — Other Ambulatory Visit (HOSPITAL_COMMUNITY): Payer: Self-pay

## 2022-04-05 MED ORDER — NICOTINE 21 MG/24HR TD PT24
21.0000 mg | MEDICATED_PATCH | Freq: Every day | TRANSDERMAL | 0 refills | Status: AC
  Filled 2022-04-05: qty 28, 28d supply, fill #0

## 2022-04-08 NOTE — Pre-Procedure Instructions (Signed)
Surgical Instructions    Your procedure is scheduled on Thursday, December 21st.  Report to [facility] Main Entrance "A" at 08:40 A.M., then check in with the Admitting office.  Call this number if you have problems the morning of surgery:  [phone]   If you have any questions prior to your surgery date call [phone]: Open Monday-Friday 8am-4pm    Remember:  Do not eat or drink after midnight the night before your surgery      Take these medicines the morning of surgery with A SIP OF WATER  atorvastatin (LIPITOR)  oxymetazoline (AFRIN) nasal spray    If needed: acetaminophen (TYLENOL)  Polyvinyl Alcohol-Povidone (REFRESH OP)  predniSONE (DELTASONE)    As of today, STOP taking any Aspirin (unless otherwise instructed by your surgeon) Aleve, Naproxen, Ibuprofen, Motrin, Advil, Goody's, BC's, all herbal medications, fish oil, and all vitamins.                     Do NOT Smoke (Tobacco/Vaping) for 24 hours prior to your procedure.  If you use a CPAP at night, you may bring your mask/headgear for your overnight stay.   Contacts, glasses, piercing's, hearing aid's, dentures or partials may not be worn into surgery, please bring cases for these belongings.    For patients admitted to the hospital, discharge time will be determined by your treatment team.   Patients discharged the day of surgery will not be allowed to drive home, and someone needs to stay with them for 24 hours.  SURGICAL WAITING ROOM VISITATION Patients having surgery or a procedure may have no more than 2 support people in the waiting area - these visitors may rotate.   Children under the age of 34 must have an adult with them who is not the patient. If the patient needs to stay at the hospital during part of their recovery, the visitor guidelines for inpatient rooms apply. Pre-op nurse will coordinate an appropriate time for 1 support person to accompany patient in pre-op.  This support person may not  rotate.   Please refer to the [employer] website for the visitor guidelines for Inpatients (after your surgery is over and you are in a regular room).    Special instructions:   [place]- Preparing For Surgery  Before surgery, you can play an important role. Because skin is not sterile, your skin needs to be as free of germs as possible. You can reduce the number of germs on your skin by washing with CHG (chlorahexidine gluconate) Soap before surgery.  CHG is an antiseptic cleaner which kills germs and bonds with the skin to continue killing germs even after washing.    Oral Hygiene is also important to reduce your risk of infection.  Remember - BRUSH YOUR TEETH THE MORNING OF SURGERY WITH YOUR REGULAR TOOTHPASTE  Please do not use if you have an allergy to CHG or antibacterial soaps. If your skin becomes reddened/irritated stop using the CHG.  Do not shave (including legs and underarms) for at least 48 hours prior to first CHG shower. It is OK to shave your face.  Please follow these instructions carefully.   Shower the NIGHT BEFORE SURGERY and the MORNING OF SURGERY  If you chose to wash your hair, wash your hair first as usual with your normal shampoo.  After you shampoo, rinse your hair and body thoroughly to remove the shampoo.  Use CHG Soap as you would any other liquid soap. You can apply CHG  directly to the skin and wash gently with a scrungie or a clean washcloth.   Apply the CHG Soap to your body ONLY FROM THE NECK DOWN.  Do not use on open wounds or open sores. Avoid contact with your eyes, ears, mouth and genitals (private parts). Wash Face and genitals (private parts)  with your normal soap.   Wash thoroughly, paying special attention to the area where your surgery will be performed.  Thoroughly rinse your body with warm water from the neck down.  DO NOT shower/wash with your normal soap after using and rinsing off the CHG Soap.  Pat yourself dry with a CLEAN  TOWEL.  Wear CLEAN PAJAMAS to bed the night before surgery  Place CLEAN SHEETS on your bed the night before your surgery  DO NOT SLEEP WITH PETS.   Day of Surgery: Take a shower with CHG soap. Do not wear jewelry  Do not wear lotions, powders, colognes, or deodorant.  Men may shave face and neck. Do not bring valuables to the hospital. [facility] is not responsible for any belongings or valuables.  Wear Clean/Comfortable clothing the morning of surgery Remember to brush your teeth WITH YOUR REGULAR TOOTHPASTE.   Please read over the following fact sheets that you were given.    If you received a COVID test during your pre-op visit  it is requested that you wear a mask when out in public, stay away from anyone that may not be feeling well and notify your surgeon if you develop symptoms. If you have been in contact with anyone that has tested positive in the last 10 days please notify you surgeon.

## 2022-04-09 ENCOUNTER — Encounter (HOSPITAL_COMMUNITY): Payer: Self-pay

## 2022-04-09 ENCOUNTER — Other Ambulatory Visit: Payer: Self-pay

## 2022-04-09 ENCOUNTER — Encounter (HOSPITAL_COMMUNITY)
Admission: RE | Admit: 2022-04-09 | Discharge: 2022-04-09 | Disposition: A | Discharge status: Home / Self Care | Payer: 59 | Source: Ambulatory Visit | Attending: Thoracic Surgery (Cardiothoracic Vascular Surgery) | Admitting: Thoracic Surgery (Cardiothoracic Vascular Surgery)

## 2022-04-09 ENCOUNTER — Ambulatory Visit (HOSPITAL_COMMUNITY)
Admission: RE | Admit: 2022-04-09 | Discharge: 2022-04-09 | Disposition: A | Discharge status: Home / Self Care | Payer: 59 | Source: Ambulatory Visit | Attending: Thoracic Surgery (Cardiothoracic Vascular Surgery) | Admitting: Thoracic Surgery (Cardiothoracic Vascular Surgery)

## 2022-04-09 VITALS — BP 123/77 | HR 77 | Temp 98.6°F | Resp 18 | Ht 68.0 in | Wt 243.3 lb

## 2022-04-09 DIAGNOSIS — Z01818 Encounter for other preprocedural examination: Secondary | ICD-10-CM | POA: Insufficient documentation

## 2022-04-09 DIAGNOSIS — J984 Other disorders of lung: Secondary | ICD-10-CM | POA: Diagnosis not present

## 2022-04-09 DIAGNOSIS — Z87442 Personal history of urinary calculi: Secondary | ICD-10-CM | POA: Diagnosis not present

## 2022-04-09 DIAGNOSIS — Z1152 Encounter for screening for COVID-19: Secondary | ICD-10-CM | POA: Insufficient documentation

## 2022-04-09 DIAGNOSIS — E785 Hyperlipidemia, unspecified: Secondary | ICD-10-CM | POA: Diagnosis present

## 2022-04-09 DIAGNOSIS — I4510 Unspecified right bundle-branch block: Secondary | ICD-10-CM | POA: Insufficient documentation

## 2022-04-09 DIAGNOSIS — R911 Solitary pulmonary nodule: Secondary | ICD-10-CM | POA: Insufficient documentation

## 2022-04-09 DIAGNOSIS — C3432 Malignant neoplasm of lower lobe, left bronchus or lung: Secondary | ICD-10-CM | POA: Diagnosis not present

## 2022-04-09 DIAGNOSIS — Z86010 Personal history of colonic polyps: Secondary | ICD-10-CM | POA: Diagnosis not present

## 2022-04-09 DIAGNOSIS — Z882 Allergy status to sulfonamides status: Secondary | ICD-10-CM | POA: Diagnosis not present

## 2022-04-09 DIAGNOSIS — Z888 Allergy status to other drugs, medicaments and biological substances status: Secondary | ICD-10-CM | POA: Diagnosis not present

## 2022-04-09 DIAGNOSIS — I700 Atherosclerosis of aorta: Secondary | ICD-10-CM | POA: Diagnosis present

## 2022-04-09 DIAGNOSIS — J939 Pneumothorax, unspecified: Secondary | ICD-10-CM | POA: Diagnosis not present

## 2022-04-09 DIAGNOSIS — I454 Nonspecific intraventricular block: Secondary | ICD-10-CM | POA: Diagnosis present

## 2022-04-09 DIAGNOSIS — E559 Vitamin D deficiency, unspecified: Secondary | ICD-10-CM | POA: Diagnosis present

## 2022-04-09 DIAGNOSIS — J95811 Postprocedural pneumothorax: Secondary | ICD-10-CM | POA: Diagnosis not present

## 2022-04-09 DIAGNOSIS — J189 Pneumonia, unspecified organism: Secondary | ICD-10-CM | POA: Diagnosis not present

## 2022-04-09 DIAGNOSIS — J432 Centrilobular emphysema: Secondary | ICD-10-CM | POA: Diagnosis present

## 2022-04-09 DIAGNOSIS — Z79899 Other long term (current) drug therapy: Secondary | ICD-10-CM | POA: Diagnosis not present

## 2022-04-09 DIAGNOSIS — G4733 Obstructive sleep apnea (adult) (pediatric): Secondary | ICD-10-CM | POA: Diagnosis present

## 2022-04-09 DIAGNOSIS — K760 Fatty (change of) liver, not elsewhere classified: Secondary | ICD-10-CM | POA: Diagnosis present

## 2022-04-09 DIAGNOSIS — M1990 Unspecified osteoarthritis, unspecified site: Secondary | ICD-10-CM | POA: Diagnosis not present

## 2022-04-09 DIAGNOSIS — Y838 Other surgical procedures as the cause of abnormal reaction of the patient, or of later complication, without mention of misadventure at the time of the procedure: Secondary | ICD-10-CM | POA: Diagnosis not present

## 2022-04-09 DIAGNOSIS — J302 Other seasonal allergic rhinitis: Secondary | ICD-10-CM | POA: Diagnosis present

## 2022-04-09 DIAGNOSIS — F17200 Nicotine dependence, unspecified, uncomplicated: Secondary | ICD-10-CM | POA: Diagnosis not present

## 2022-04-09 DIAGNOSIS — J449 Chronic obstructive pulmonary disease, unspecified: Secondary | ICD-10-CM | POA: Diagnosis not present

## 2022-04-09 DIAGNOSIS — I10 Essential (primary) hypertension: Secondary | ICD-10-CM | POA: Diagnosis not present

## 2022-04-09 DIAGNOSIS — F17210 Nicotine dependence, cigarettes, uncomplicated: Secondary | ICD-10-CM | POA: Diagnosis not present

## 2022-04-09 LAB — COMPREHENSIVE METABOLIC PANEL
ALT: 23 U/L (ref 0–44)
AST: 25 U/L (ref 15–41)
Albumin: 4.1 g/dL (ref 3.5–5.0)
Alkaline Phosphatase: 64 U/L (ref 38–126)
Anion gap: 9 (ref 5–15)
BUN: 11 mg/dL (ref 8–23)
CO2: 28 mmol/L (ref 22–32)
Calcium: 9.6 mg/dL (ref 8.9–10.3)
Chloride: 102 mmol/L (ref 98–111)
Creatinine, Ser: 0.95 mg/dL (ref 0.61–1.24)
GFR, Estimated: >60 mL/min (ref >60)
Glucose, Bld: 100 mg/dL — ABNORMAL HIGH (ref 70–99)
Potassium: 4.2 mmol/L (ref 3.5–5.1)
Sodium: 139 mmol/L (ref 135–145)
Total Bilirubin: 0.7 mg/dL (ref 0.3–1.2)
Total Protein: 6.8 g/dL (ref 6.5–8.1)

## 2022-04-09 LAB — CBC
HCT: 48.6 % (ref 39.0–52.0)
Hemoglobin: 16.4 g/dL (ref 13.0–17.0)
MCH: 31.6 pg (ref 26.0–34.0)
MCHC: 33.7 g/dL (ref 30.0–36.0)
MCV: 93.6 fL (ref 80.0–100.0)
Platelets: 274 10*3/uL (ref 150–400)
RBC: 5.19 MIL/uL (ref 4.22–5.81)
RDW: 12.9 % (ref 11.5–15.5)
WBC: 10.2 10*3/uL (ref 4.0–10.5)
nRBC: 0 % (ref 0.0–0.2)

## 2022-04-09 LAB — SURGICAL PCR SCREEN
MRSA, PCR: NEGATIVE
Staphylococcus aureus: NEGATIVE

## 2022-04-09 LAB — URINALYSIS, ROUTINE W REFLEX MICROSCOPIC
Bilirubin Urine: NEGATIVE
Glucose, UA: NEGATIVE mg/dL
Hgb urine dipstick: NEGATIVE
Ketones, ur: NEGATIVE mg/dL
Leukocytes,Ua: NEGATIVE
Nitrite: NEGATIVE
Protein, ur: NEGATIVE mg/dL
Specific Gravity, Urine: 1.015 (ref 1.005–1.030)
pH: 6 (ref 5.0–8.0)

## 2022-04-09 LAB — TYPE AND SCREEN
ABO/RH(D): A POS
Antibody Screen: NEGATIVE

## 2022-04-09 LAB — APTT: aPTT: 30 seconds (ref 24–36)

## 2022-04-09 LAB — PROTIME-INR
INR: 1 (ref 0.8–1.2)
Prothrombin Time: 12.7 seconds (ref 11.4–15.2)

## 2022-04-09 NOTE — Progress Notes (Signed)
PCP: Dr. Koleen Nimrod Cardiologist: Dr. Radford Pax consulted 2022, pt reports he does not currently follow up with cardiology  EKG: Today CXR: Today ECHO: 03/07/21 Stress Test: 04/03/22 Cardiac Cath: denies PFTs: 02/27/22  Covid test: Performed today  ERAS: NPO  Patient denies shortness of breath, fever, cough, and chest pain at PAT appointment.  Patient verbalized understanding of instructions provided today at the PAT appointment.  Patient asked to review instructions at home and day of surgery.

## 2022-04-10 LAB — SARS CORONAVIRUS 2 (TAT 6-24 HRS): SARS Coronavirus 2: NEGATIVE

## 2022-04-11 ENCOUNTER — Other Ambulatory Visit: Payer: Self-pay

## 2022-04-11 ENCOUNTER — Inpatient Hospital Stay (HOSPITAL_COMMUNITY)
Admission: RE | Admit: 2022-04-11 | Discharge: 2022-04-13 | DRG: 164 | Disposition: A | Discharge status: Home / Self Care | Payer: 59 | Attending: Thoracic Surgery (Cardiothoracic Vascular Surgery) | Admitting: Thoracic Surgery (Cardiothoracic Vascular Surgery)

## 2022-04-11 ENCOUNTER — Encounter (HOSPITAL_COMMUNITY)
Admission: RE | Disposition: A | Discharge status: Home / Self Care | Payer: Self-pay | Source: Home / Self Care | Attending: Thoracic Surgery (Cardiothoracic Vascular Surgery)

## 2022-04-11 ENCOUNTER — Inpatient Hospital Stay (HOSPITAL_COMMUNITY): Payer: 59 | Admitting: Physician Assistant

## 2022-04-11 ENCOUNTER — Inpatient Hospital Stay (HOSPITAL_COMMUNITY): Payer: 59

## 2022-04-11 ENCOUNTER — Encounter (HOSPITAL_COMMUNITY): Payer: Self-pay | Admitting: Thoracic Surgery (Cardiothoracic Vascular Surgery)

## 2022-04-11 DIAGNOSIS — I700 Atherosclerosis of aorta: Secondary | ICD-10-CM | POA: Diagnosis present

## 2022-04-11 DIAGNOSIS — Y838 Other surgical procedures as the cause of abnormal reaction of the patient, or of later complication, without mention of misadventure at the time of the procedure: Secondary | ICD-10-CM | POA: Diagnosis not present

## 2022-04-11 DIAGNOSIS — I10 Essential (primary) hypertension: Secondary | ICD-10-CM | POA: Diagnosis not present

## 2022-04-11 DIAGNOSIS — R911 Solitary pulmonary nodule: Secondary | ICD-10-CM | POA: Diagnosis present

## 2022-04-11 DIAGNOSIS — J302 Other seasonal allergic rhinitis: Secondary | ICD-10-CM | POA: Diagnosis present

## 2022-04-11 DIAGNOSIS — M1990 Unspecified osteoarthritis, unspecified site: Secondary | ICD-10-CM | POA: Diagnosis present

## 2022-04-11 DIAGNOSIS — J449 Chronic obstructive pulmonary disease, unspecified: Secondary | ICD-10-CM

## 2022-04-11 DIAGNOSIS — Z888 Allergy status to other drugs, medicaments and biological substances status: Secondary | ICD-10-CM | POA: Diagnosis not present

## 2022-04-11 DIAGNOSIS — C3432 Malignant neoplasm of lower lobe, left bronchus or lung: Principal | ICD-10-CM | POA: Diagnosis present

## 2022-04-11 DIAGNOSIS — F17210 Nicotine dependence, cigarettes, uncomplicated: Secondary | ICD-10-CM

## 2022-04-11 DIAGNOSIS — G4733 Obstructive sleep apnea (adult) (pediatric): Secondary | ICD-10-CM | POA: Diagnosis present

## 2022-04-11 DIAGNOSIS — J95811 Postprocedural pneumothorax: Secondary | ICD-10-CM | POA: Diagnosis not present

## 2022-04-11 DIAGNOSIS — E785 Hyperlipidemia, unspecified: Secondary | ICD-10-CM | POA: Diagnosis present

## 2022-04-11 DIAGNOSIS — Z87442 Personal history of urinary calculi: Secondary | ICD-10-CM | POA: Diagnosis not present

## 2022-04-11 DIAGNOSIS — Z882 Allergy status to sulfonamides status: Secondary | ICD-10-CM

## 2022-04-11 DIAGNOSIS — I454 Nonspecific intraventricular block: Secondary | ICD-10-CM | POA: Diagnosis present

## 2022-04-11 DIAGNOSIS — Z86010 Personal history of colonic polyps: Secondary | ICD-10-CM

## 2022-04-11 DIAGNOSIS — E559 Vitamin D deficiency, unspecified: Secondary | ICD-10-CM | POA: Diagnosis present

## 2022-04-11 DIAGNOSIS — K760 Fatty (change of) liver, not elsewhere classified: Secondary | ICD-10-CM | POA: Diagnosis present

## 2022-04-11 DIAGNOSIS — J432 Centrilobular emphysema: Secondary | ICD-10-CM | POA: Diagnosis present

## 2022-04-11 DIAGNOSIS — Z1152 Encounter for screening for COVID-19: Secondary | ICD-10-CM | POA: Diagnosis not present

## 2022-04-11 DIAGNOSIS — Z79899 Other long term (current) drug therapy: Secondary | ICD-10-CM

## 2022-04-11 DIAGNOSIS — J939 Pneumothorax, unspecified: Secondary | ICD-10-CM | POA: Diagnosis not present

## 2022-04-11 LAB — ABO/RH: ABO/RH(D): A POS

## 2022-04-11 SURGERY — WEDGE RESECTION, LUNG, ROBOT-ASSISTED, THORACOSCOPIC
Anesthesia: General | Site: Chest | Laterality: Left

## 2022-04-11 MED ORDER — ONDANSETRON HCL 4 MG/2ML IJ SOLN: 4.0000 mg | Freq: Four times a day (QID) | INTRAMUSCULAR | Status: DC | PRN

## 2022-04-11 MED ORDER — BUPIVACAINE LIPOSOME 1.3 % IJ SUSP: INTRAMUSCULAR | Status: DC | PRN

## 2022-04-11 MED ORDER — KETOROLAC TROMETHAMINE 15 MG/ML IJ SOLN
30.0000 mg | Freq: Four times a day (QID) | INTRAMUSCULAR | Status: DC
  Administered 2022-04-11 – 2022-04-13 (×7): 30 mg via INTRAVENOUS
  Filled 2022-04-11 (×7): qty 2

## 2022-04-11 MED ORDER — ENOXAPARIN SODIUM 40 MG/0.4ML IJ SOSY
40.0000 mg | PREFILLED_SYRINGE | INTRAMUSCULAR | Status: DC
  Administered 2022-04-12: 40 mg via SUBCUTANEOUS
  Filled 2022-04-11: qty 0.4

## 2022-04-11 MED ORDER — ONDANSETRON HCL 4 MG/2ML IJ SOLN
INTRAMUSCULAR | Status: DC | PRN
  Administered 2022-04-11: 4 mg via INTRAVENOUS

## 2022-04-11 MED ORDER — LIDOCAINE 2% (20 MG/ML) 5 ML SYRINGE
INTRAMUSCULAR | Status: AC
  Filled 2022-04-11: qty 5

## 2022-04-11 MED ORDER — PROPOFOL 10 MG/ML IV BOLUS
INTRAVENOUS | Status: DC | PRN
  Administered 2022-04-11: 30 mg via INTRAVENOUS
  Administered 2022-04-11: 170 mg via INTRAVENOUS

## 2022-04-11 MED ORDER — HYDROMORPHONE HCL 1 MG/ML IJ SOLN
0.2500 mg | INTRAMUSCULAR | Status: DC | PRN
  Administered 2022-04-11 (×4): 0.5 mg via INTRAVENOUS

## 2022-04-11 MED ORDER — TRAMADOL HCL 50 MG PO TABS: 50.0000 mg | ORAL_TABLET | Freq: Four times a day (QID) | ORAL | Status: DC | PRN

## 2022-04-11 MED ORDER — OXYMETAZOLINE HCL 0.05 % NA SOLN
1.0000 | Freq: Two times a day (BID) | NASAL | Status: DC
  Administered 2022-04-12 – 2022-04-13 (×4): 1 via NASAL
  Filled 2022-04-11: qty 30

## 2022-04-11 MED ORDER — ATORVASTATIN CALCIUM 80 MG PO TABS
80.0000 mg | ORAL_TABLET | Freq: Every day | ORAL | Status: DC
  Administered 2022-04-11 – 2022-04-13 (×3): 80 mg via ORAL
  Filled 2022-04-11 (×3): qty 1

## 2022-04-11 MED ORDER — PROPOFOL 10 MG/ML IV BOLUS
INTRAVENOUS | Status: AC
  Filled 2022-04-11: qty 20

## 2022-04-11 MED ORDER — ROCURONIUM BROMIDE 10 MG/ML (PF) SYRINGE
PREFILLED_SYRINGE | INTRAVENOUS | Status: DC | PRN
  Administered 2022-04-11 (×2): 20 mg via INTRAVENOUS
  Administered 2022-04-11: 60 mg via INTRAVENOUS
  Administered 2022-04-11: 20 mg via INTRAVENOUS
  Administered 2022-04-11: 40 mg via INTRAVENOUS

## 2022-04-11 MED ORDER — FENTANYL CITRATE (PF) 100 MCG/2ML IJ SOLN: 25.0000 ug | INTRAMUSCULAR | Status: DC | PRN

## 2022-04-11 MED ORDER — LUNG SURGERY BOOK
Freq: Once | Status: AC
  Filled 2022-04-11: qty 1

## 2022-04-11 MED ORDER — PHENYLEPHRINE HCL-NACL 20-0.9 MG/250ML-% IV SOLN
INTRAVENOUS | Status: DC | PRN
  Administered 2022-04-11: 15 ug/min via INTRAVENOUS

## 2022-04-11 MED ORDER — OXYCODONE HCL 5 MG PO TABS
5.0000 mg | ORAL_TABLET | ORAL | Status: DC | PRN
  Administered 2022-04-11: 5 mg via ORAL
  Administered 2022-04-12: 10 mg via ORAL
  Administered 2022-04-13: 5 mg via ORAL
  Filled 2022-04-11: qty 2
  Filled 2022-04-11 (×2): qty 1

## 2022-04-11 MED ORDER — MORPHINE SULFATE (PF) 2 MG/ML IV SOLN: 1.0000 mg | INTRAVENOUS | Status: DC | PRN

## 2022-04-11 MED ORDER — ORAL CARE MOUTH RINSE: 15.0000 mL | Freq: Once | OROMUCOSAL | Status: AC

## 2022-04-11 MED ORDER — ACETAMINOPHEN 500 MG PO TABS
1000.0000 mg | ORAL_TABLET | Freq: Four times a day (QID) | ORAL | Status: DC
  Administered 2022-04-11 – 2022-04-13 (×6): 1000 mg via ORAL
  Filled 2022-04-11 (×6): qty 2

## 2022-04-11 MED ORDER — BISACODYL 5 MG PO TBEC
10.0000 mg | DELAYED_RELEASE_TABLET | Freq: Every day | ORAL | Status: DC
  Administered 2022-04-12 – 2022-04-13 (×2): 10 mg via ORAL
  Filled 2022-04-11 (×3): qty 2

## 2022-04-11 MED ORDER — ACETAMINOPHEN 160 MG/5ML PO SOLN
1000.0000 mg | Freq: Four times a day (QID) | ORAL | Status: DC
  Administered 2022-04-12: 1000 mg via ORAL
  Filled 2022-04-11: qty 40.6

## 2022-04-11 MED ORDER — PHENYLEPHRINE 80 MCG/ML (10ML) SYRINGE FOR IV PUSH (FOR BLOOD PRESSURE SUPPORT)
PREFILLED_SYRINGE | INTRAVENOUS | Status: AC
  Filled 2022-04-11: qty 10

## 2022-04-11 MED ORDER — LACTATED RINGERS IV SOLN: INTRAVENOUS | Status: DC | PRN

## 2022-04-11 MED ORDER — PHENYLEPHRINE 80 MCG/ML (10ML) SYRINGE FOR IV PUSH (FOR BLOOD PRESSURE SUPPORT)
PREFILLED_SYRINGE | INTRAVENOUS | Status: DC | PRN
  Administered 2022-04-11: 40 ug via INTRAVENOUS

## 2022-04-11 MED ORDER — BUPIVACAINE HCL (PF) 0.5 % IJ SOLN: INTRAMUSCULAR | Status: DC | PRN

## 2022-04-11 MED ORDER — BUPIVACAINE HCL (PF) 0.5 % IJ SOLN
INTRAMUSCULAR | Status: AC
  Filled 2022-04-11: qty 30

## 2022-04-11 MED ORDER — ROCURONIUM BROMIDE 10 MG/ML (PF) SYRINGE
PREFILLED_SYRINGE | INTRAVENOUS | Status: AC
  Filled 2022-04-11: qty 10

## 2022-04-11 MED ORDER — DEXAMETHASONE SODIUM PHOSPHATE 10 MG/ML IJ SOLN
INTRAMUSCULAR | Status: DC | PRN
  Administered 2022-04-11: 5 mg via INTRAVENOUS

## 2022-04-11 MED ORDER — NICOTINE 21 MG/24HR TD PT24
21.0000 mg | MEDICATED_PATCH | Freq: Every day | TRANSDERMAL | Status: DC
  Administered 2022-04-11 – 2022-04-13 (×3): 21 mg via TRANSDERMAL
  Filled 2022-04-11 (×3): qty 1

## 2022-04-11 MED ORDER — DOCUSATE SODIUM 50 MG PO CAPS: 50.0000 mg | ORAL_CAPSULE | ORAL | Status: DC

## 2022-04-11 MED ORDER — FENTANYL CITRATE (PF) 250 MCG/5ML IJ SOLN
INTRAMUSCULAR | Status: AC
  Filled 2022-04-11: qty 5

## 2022-04-11 MED ORDER — SENNOSIDES-DOCUSATE SODIUM 8.6-50 MG PO TABS
1.0000 | ORAL_TABLET | Freq: Every day | ORAL | Status: DC
  Administered 2022-04-11 – 2022-04-12 (×2): 1 via ORAL
  Filled 2022-04-11 (×2): qty 1

## 2022-04-11 MED ORDER — 0.9 % SODIUM CHLORIDE (POUR BTL) OPTIME
TOPICAL | Status: DC | PRN
  Administered 2022-04-11 (×2): 1000 mL

## 2022-04-11 MED ORDER — PANTOPRAZOLE SODIUM 40 MG PO TBEC
40.0000 mg | DELAYED_RELEASE_TABLET | Freq: Every day | ORAL | Status: DC
  Administered 2022-04-12 – 2022-04-13 (×2): 40 mg via ORAL
  Filled 2022-04-11 (×2): qty 1

## 2022-04-11 MED ORDER — ORAL CARE MOUTH RINSE: 15.0000 mL | OROMUCOSAL | Status: DC | PRN

## 2022-04-11 MED ORDER — CEFAZOLIN SODIUM-DEXTROSE 2-4 GM/100ML-% IV SOLN
2.0000 g | Freq: Three times a day (TID) | INTRAVENOUS | Status: AC
  Administered 2022-04-11 – 2022-04-12 (×2): 2 g via INTRAVENOUS
  Filled 2022-04-11 (×2): qty 100

## 2022-04-11 MED ORDER — BUPIVACAINE LIPOSOME 1.3 % IJ SUSP
INTRAMUSCULAR | Status: AC
  Filled 2022-04-11: qty 20

## 2022-04-11 MED ORDER — FENTANYL CITRATE (PF) 250 MCG/5ML IJ SOLN
INTRAMUSCULAR | Status: DC | PRN
  Administered 2022-04-11 (×5): 50 ug via INTRAVENOUS
  Administered 2022-04-11: 100 ug via INTRAVENOUS
  Administered 2022-04-11: 50 ug via INTRAVENOUS

## 2022-04-11 MED ORDER — CHLORHEXIDINE GLUCONATE 0.12 % MT SOLN
OROMUCOSAL | Status: AC
  Administered 2022-04-11: 15 mL via OROMUCOSAL
  Filled 2022-04-11: qty 15

## 2022-04-11 MED ORDER — MIDAZOLAM HCL 5 MG/5ML IJ SOLN
INTRAMUSCULAR | Status: DC | PRN
  Administered 2022-04-11: 2 mg via INTRAVENOUS

## 2022-04-11 MED ORDER — HYDROMORPHONE HCL 1 MG/ML IJ SOLN
INTRAMUSCULAR | Status: AC
  Filled 2022-04-11: qty 1

## 2022-04-11 MED ORDER — MIDAZOLAM HCL 2 MG/2ML IJ SOLN
INTRAMUSCULAR | Status: AC
  Filled 2022-04-11: qty 2

## 2022-04-11 MED ORDER — HEMOSTATIC AGENTS (NO CHARGE) OPTIME
TOPICAL | Status: DC | PRN
  Administered 2022-04-11: 1 via TOPICAL

## 2022-04-11 MED ORDER — CEFAZOLIN SODIUM-DEXTROSE 2-4 GM/100ML-% IV SOLN
2.0000 g | INTRAVENOUS | Status: AC
  Administered 2022-04-11: 2 g via INTRAVENOUS
  Filled 2022-04-11: qty 100

## 2022-04-11 MED ORDER — CHLORHEXIDINE GLUCONATE 0.12 % MT SOLN: 15.0000 mL | Freq: Once | OROMUCOSAL | Status: AC

## 2022-04-11 MED ORDER — DEXAMETHASONE SODIUM PHOSPHATE 10 MG/ML IJ SOLN
INTRAMUSCULAR | Status: AC
  Filled 2022-04-11: qty 1

## 2022-04-11 MED ORDER — LIDOCAINE 2% (20 MG/ML) 5 ML SYRINGE
INTRAMUSCULAR | Status: DC | PRN
  Administered 2022-04-11: 60 mg via INTRAVENOUS

## 2022-04-11 MED ORDER — ONDANSETRON HCL 4 MG/2ML IJ SOLN
INTRAMUSCULAR | Status: AC
  Filled 2022-04-11: qty 2

## 2022-04-11 MED ORDER — LACTATED RINGERS IV SOLN: INTRAVENOUS | Status: DC

## 2022-04-11 MED ORDER — SUGAMMADEX SODIUM 200 MG/2ML IV SOLN
INTRAVENOUS | Status: DC | PRN
  Administered 2022-04-11: 200 mg via INTRAVENOUS

## 2022-04-11 SURGICAL SUPPLY — 111 items
ADH SKN CLS APL DERMABOND .7 (GAUZE/BANDAGES/DRESSINGS) ×1
APL PRP STRL LF DISP 70% ISPRP (MISCELLANEOUS) ×1
BAG SPEC RTRVL C125 8X14 (MISCELLANEOUS) ×1
BAG SPEC RTRVL C1550 15 (MISCELLANEOUS) ×1
BLADE CLIPPER SURG (BLADE) ×1 IMPLANT
CANISTER SUCT 3000ML PPV (MISCELLANEOUS) ×2 IMPLANT
CANNULA REDUC XI 12-8 STAPL (CANNULA) ×2
CANNULA REDUCER 12-8 DVNC XI (CANNULA) ×2 IMPLANT
CATH TROCAR 20FR (CATHETERS) IMPLANT
CHLORAPREP W/TINT 26 (MISCELLANEOUS) ×1 IMPLANT
CLIP VESOCCLUDE MED 6/CT (CLIP) IMPLANT
CNTNR URN SCR LID CUP LEK RST (MISCELLANEOUS) ×5 IMPLANT
CONN ST 1/4X3/8  BEN (MISCELLANEOUS) ×1
CONN ST 1/4X3/8 BEN (MISCELLANEOUS) IMPLANT
CONT SPEC 4OZ STRL OR WHT (MISCELLANEOUS) ×5
DEFOGGER SCOPE WARMER CLEARIFY (MISCELLANEOUS) ×1 IMPLANT
DERMABOND ADVANCED .7 DNX12 (GAUZE/BANDAGES/DRESSINGS) ×1 IMPLANT
DRAIN CHANNEL 28F RND 3/8 FF (WOUND CARE) IMPLANT
DRAIN CHANNEL 32F RND 10.7 FF (WOUND CARE) IMPLANT
DRAPE ARM DVNC X/XI (DISPOSABLE) ×4 IMPLANT
DRAPE COLUMN DVNC XI (DISPOSABLE) ×1 IMPLANT
DRAPE CV SPLIT W-CLR ANES SCRN (DRAPES) ×1 IMPLANT
DRAPE DA VINCI XI ARM (DISPOSABLE) ×4
DRAPE DA VINCI XI COLUMN (DISPOSABLE) ×1
DRAPE HALF SHEET 40X57 (DRAPES) ×1 IMPLANT
DRAPE INCISE 23X17 IOBAN STRL (DRAPES) ×1
DRAPE INCISE 23X17 STRL (DRAPES) IMPLANT
DRAPE INCISE IOBAN 23X17 STRL (DRAPES) ×1 IMPLANT
DRAPE ORTHO SPLIT 77X108 STRL (DRAPES) ×1
DRAPE SURG ORHT 6 SPLT 77X108 (DRAPES) ×1 IMPLANT
ELECT BLADE 6.5 EXT (BLADE) IMPLANT
ELECT REM PT RETURN 9FT ADLT (ELECTROSURGICAL) ×1
ELECTRODE REM PT RTRN 9FT ADLT (ELECTROSURGICAL) ×1 IMPLANT
GAUZE KITTNER 4X5 RF (MISCELLANEOUS) ×1 IMPLANT
GAUZE SPONGE 4X4 12PLY STRL (GAUZE/BANDAGES/DRESSINGS) ×1 IMPLANT
GLOVE BIO SURGEON STRL SZ7.5 (GLOVE) ×2 IMPLANT
GLOVE SURG SS PI 8.0 STRL IVOR (GLOVE) ×1 IMPLANT
GOWN STRL REUS W/ TWL LRG LVL3 (GOWN DISPOSABLE) ×2 IMPLANT
GOWN STRL REUS W/ TWL XL LVL3 (GOWN DISPOSABLE) ×2 IMPLANT
GOWN STRL REUS W/TWL 2XL LVL3 (GOWN DISPOSABLE) ×1 IMPLANT
GOWN STRL REUS W/TWL LRG LVL3 (GOWN DISPOSABLE) ×2
GOWN STRL REUS W/TWL XL LVL3 (GOWN DISPOSABLE) ×2
HEMOSTAT SURGICEL 2X14 (HEMOSTASIS) ×3 IMPLANT
KIT BASIN OR (CUSTOM PROCEDURE TRAY) ×1 IMPLANT
KIT SUCTION CATH 14FR (SUCTIONS) IMPLANT
KIT TURNOVER KIT B (KITS) ×1 IMPLANT
NDL 22X1.5 STRL (OR ONLY) (MISCELLANEOUS) ×1 IMPLANT
NDL HYPO 25GX1X1/2 BEV (NEEDLE) ×1 IMPLANT
NEEDLE 22X1.5 STRL (OR ONLY) (MISCELLANEOUS) ×1 IMPLANT
NEEDLE HYPO 25GX1X1/2 BEV (NEEDLE) ×1 IMPLANT
NS IRRIG 1000ML POUR BTL (IV SOLUTION) ×3 IMPLANT
PACK CHEST (CUSTOM PROCEDURE TRAY) ×1 IMPLANT
PAD ARMBOARD 7.5X6 YLW CONV (MISCELLANEOUS) ×5 IMPLANT
PORT ACCESS TROCAR AIRSEAL 12 (TROCAR) ×1 IMPLANT
RELOAD STAPLE 45 2.0 GRY DVNC (STAPLE) IMPLANT
RELOAD STAPLE 45 2.5 WHT DVNC (STAPLE) IMPLANT
RELOAD STAPLE 45 3.5 BLU DVNC (STAPLE) IMPLANT
RELOAD STAPLE 45 4.3 GRN DVNC (STAPLE) IMPLANT
RELOAD STAPLE 45 4.6 BLK DVNC (STAPLE) IMPLANT
RELOAD STAPLER 2.5X45 WHT DVNC (STAPLE) ×2 IMPLANT
RELOAD STAPLER 3.5X45 BLU DVNC (STAPLE) ×7 IMPLANT
RELOAD STAPLER 4.3X45 GRN DVNC (STAPLE) ×2 IMPLANT
RELOAD STAPLER 45 4.6 BLK DVNC (STAPLE) ×1 IMPLANT
SEAL CANN UNIV 5-8 DVNC XI (MISCELLANEOUS) ×2 IMPLANT
SEAL XI 5MM-8MM UNIVERSAL (MISCELLANEOUS) ×2
SET TRI-LUMEN FLTR TB AIRSEAL (TUBING) ×1 IMPLANT
SOLUTION ELECTROLUBE (MISCELLANEOUS) IMPLANT
SPONGE INTESTINAL PEANUT (DISPOSABLE) IMPLANT
STAPLE RELOAD 45 2.0 GRAY (STAPLE) ×1
STAPLE RELOAD 45 2.0 GRAY DVNC (STAPLE) ×1 IMPLANT
STAPLER 45 DA VINCI SURE FORM (STAPLE) ×2
STAPLER 45 SUREFORM DVNC (STAPLE) IMPLANT
STAPLER CANNULA SEAL DVNC XI (STAPLE) ×2 IMPLANT
STAPLER CANNULA SEAL XI (STAPLE) ×2
STAPLER RELOAD 2.5X45 WHITE (STAPLE) ×2
STAPLER RELOAD 2.5X45 WHT DVNC (STAPLE) ×2
STAPLER RELOAD 3.5X45 BLU DVNC (STAPLE) ×7
STAPLER RELOAD 3.5X45 BLUE (STAPLE) ×7
STAPLER RELOAD 4.3X45 GREEN (STAPLE) ×2
STAPLER RELOAD 4.3X45 GRN DVNC (STAPLE) ×2
STAPLER RELOAD 45 4.6 BLK (STAPLE) ×1
STAPLER RELOAD 45 4.6 BLK DVNC (STAPLE) ×1
STOPCOCK 4 WAY LG BORE MALE ST (IV SETS) ×1 IMPLANT
SUT PDS AB 1 CTX 36 (SUTURE) IMPLANT
SUT PROLENE 4 0 RB 1 (SUTURE)
SUT PROLENE 4-0 RB1 .5 CRCL 36 (SUTURE) IMPLANT
SUT SILK  1 MH (SUTURE) ×1
SUT SILK 1 MH (SUTURE) ×1 IMPLANT
SUT SILK 2 0 SH (SUTURE) IMPLANT
SUT SILK 2 0SH CR/8 30 (SUTURE) IMPLANT
SUT VIC AB 1 CTX 36 (SUTURE)
SUT VIC AB 1 CTX36XBRD ANBCTR (SUTURE) IMPLANT
SUT VIC AB 2-0 CT1 27 (SUTURE) ×1
SUT VIC AB 2-0 CT1 TAPERPNT 27 (SUTURE) ×1 IMPLANT
SUT VIC AB 3-0 SH 27 (SUTURE) ×3
SUT VIC AB 3-0 SH 27X BRD (SUTURE) ×3 IMPLANT
SUT VICRYL 0 TIES 12 18 (SUTURE) ×1 IMPLANT
SUT VICRYL 0 UR6 27IN ABS (SUTURE) ×2 IMPLANT
SYR 10ML LL (SYRINGE) ×1 IMPLANT
SYR 20ML LL LF (SYRINGE) ×1 IMPLANT
SYR 50ML LL SCALE MARK (SYRINGE) ×1 IMPLANT
SYSTEM RETRIEVAL ANCHOR 15 (MISCELLANEOUS) IMPLANT
SYSTEM RETRIEVAL ANCHOR 8 (MISCELLANEOUS) IMPLANT
SYSTEM SAHARA CHEST DRAIN ATS (WOUND CARE) ×1 IMPLANT
TAPE CLOTH 4X10 WHT NS (GAUZE/BANDAGES/DRESSINGS) ×1 IMPLANT
TAPE CLOTH SURG 4X10 WHT LF (GAUZE/BANDAGES/DRESSINGS) IMPLANT
TIP APPLICATOR SPRAY EXTEND 16 (VASCULAR PRODUCTS) IMPLANT
TOWEL GREEN STERILE (TOWEL DISPOSABLE) ×1 IMPLANT
TRAY FOLEY MTR SLVR 16FR STAT (SET/KITS/TRAYS/PACK) ×1 IMPLANT
TUBING EXTENTION W/L.L. (IV SETS) ×1 IMPLANT
WATER STERILE IRR 1000ML POUR (IV SOLUTION) ×1 IMPLANT

## 2022-04-11 NOTE — Brief Op Note (Signed)
04/11/2022  2:09 PM  PATIENT:  Javier Vega  61 y.o. male  PRE-OPERATIVE DIAGNOSIS:  LEFT LOWER LOBE PULMONARY NODULE  POST-OPERATIVE DIAGNOSIS:  ADENOCARCINOMA LEFT LOWER LOBE  PROCEDURE: XI ROBOTIC ASSISTED LEFT THORACOSCOPY, WEDGE LLL, LEFT LOWER LOBECTOMY, LYMPH NODE DISSECTION, and INTERCOSTAL NERVE BLOCK  SURGEON:  Surgeon(s) and Role:    Lightfoot, Lucile Crater, MD - Primary  PHYSICIAN ASSISTANT: Lars Pinks PA-C  ANESTHESIA:   general  EBL:  25 mL   BLOOD ADMINISTERED:none  DRAINS:  Blake drain placed in the left pleural space    LOCAL MEDICATIONS USED:  OTHER Exparel  SPECIMEN:  Source of Specimen:  Wedge LLL positive for adenocarcinoma, multiple lymph nodes, LLL  DISPOSITION OF SPECIMEN:  PATHOLOGY  COUNTS CORRECT:  YES  DICTATION: .Dragon Dictation  PLAN OF CARE: Admit to inpatient   PATIENT DISPOSITION:  PACU - hemodynamically stable.   Delay start of Pharmacological VTE agent (>24hrs) due to surgical blood loss or risk of bleeding: no

## 2022-04-11 NOTE — Discharge Summary (Signed)
[address]       [place] [address]             [phone]    Physician Discharge Summary  Patient ID: Javier Vega MRN: [id number] DOB/AGE: 61-Aug-1962 61 y.o.  Admit date: 04/11/2022 Discharge date: 04/13/2022  Admission Diagnoses:  Patient Active Problem List   Diagnosis Date Noted   Left lower lobe pulmonary nodule 04/11/2022   Vasomotor rhinitis 03/28/2022   Chronic allergic conjunctivitis 03/28/2022   Lung nodule 02/27/2022   Tobacco use 02/27/2022   Tear of medial meniscus of knee 07/01/2017   Right knee pain 06/17/2017   Atherosclerosis of aorta ([place]) 05/12/2017   Right maxillary sinusitis, chronic 03/13/2015   OSA / Intolerant to CPAP 08/18/2014   Obesity 02/15/2014   Tobacco use disorder 02/15/2014   Medication management 10/08/2013   Other abnormal glucose 05/18/2013   Essential hypertension 05/18/2013   Hyperlipidemia    Vitamin D deficiency      Discharge Diagnoses:  Patient Active Problem List   Diagnosis Date Noted   Left lower lobe pulmonary nodule 04/11/2022   Vasomotor rhinitis 03/28/2022   Chronic allergic conjunctivitis 03/28/2022   Lung nodule 02/27/2022   Tobacco use 02/27/2022   Tear of medial meniscus of knee 07/01/2017   Right knee pain 06/17/2017   Atherosclerosis of aorta ([place]) 05/12/2017   Right maxillary sinusitis, chronic 03/13/2015   OSA / Intolerant to CPAP 08/18/2014   Obesity 02/15/2014   Tobacco use disorder 02/15/2014   Medication management 10/08/2013   Other abnormal glucose 05/18/2013   Essential hypertension 05/18/2013   Hyperlipidemia    Vitamin D deficiency      Discharged Condition: stable  HPI: This is a 61 y.o. male presents for surgical evaluation of a 1.7 cm left lower lobe pulmonary nodule.  He is a current smoker and this was originally identified in the lung cancer screening program.  He denies any shortness of breath or coughs.  He also denies any weight loss or neurologic  symptoms.  Dr.Lightfoot discussed the need for Xi robotic assisted wedge LLL, possible LLL, LN dissection, and intercostal nerve block. Potential risks, benefits, and complications of the surgery were discussed with the patient and he agreed to proceed with surgery.   Hospital Course: Patient underwent a Xi robotic assisted left thoracoscopy, wedge LLL, LLL, LN dissection, and intercostal nerve block. Patient was extubated and transferred from the OR to PACU in stable condition. A line and foley were removed early in his post operative course. Chest tube was to water seal. Daily chest x rays were obtained and remained stable. Chest tube was clamped on post op day one. Follow up CXR showed small left apical pneumothorax. Chest tube was removed. Patient has been ambulating on room air with good oxygenation. All wounds are clean, dry, healing without signs of infection. Patient has been tolerating a diet. PA/LAT CXR 12/23 showed a tiny left apical pneumothorax (smaller). He is felt surgically stable for discharge. Of note, multiple pharmacies do not have Oxy 5 mg. Wife and patient instructed to go to [facility] at [place]. [employer] as they do have it.   Consults: None  Significant Diagnostic Studies:   Narrative & Impression  CLINICAL DATA:  Follow-up pneumothorax   EXAM: POR  CLINICAL DATA:  Follow-up left pneumothorax.   EXAM: CHEST - 2 VIEW   COMPARISON:  April 12, 2022   FINDINGS: The left chest tube is been removed. A  tiny right apical pneumothorax remains, best seen medially. The pneumothorax is smaller in the interval. Soft tissue gas is identified in the lateral chest/abdominal wall, likely from the recent chest 2. No other interval changes or acute abnormalities.   IMPRESSION: The left chest tube is been removed. A tiny left apical pneumothorax remains, smaller in the interval. No other significant changes.     Electronically Signed   By: Dorise Bullion III M.D.    On: 04/13/2022 10:36    TABLE CHEST 1 VIEW   COMPARISON:  04/09/2022.   FINDINGS: Left-sided chest tube in place. Tiny left-sided pneumothorax barely perceptible. Lungs are otherwise clear. No pleural effusion. Normal pulmonary vasculature. Calcified aorta.   IMPRESSION: Tiny left apical pneumothorax.  Left-sided chest tube in place     Electronically Signed   By: Sammie Bench M.D.   On: 04/11/2022 17:36    Treatments: surgery:  Robotic assisted left thoracoscopy, wedge LLL, LLL, mediastinal LN sampling, and intercostal nerve block by Dr. Kipp Brood on 04/11/2022.  Pathology: Final pathology results pending.  Discharge Exam: Blood pressure 132/76, pulse 78, temperature 97.9 F (36.6 C), temperature source Oral, resp. rate 18, height 5\' 8"  (1.727 m), weight 107.5 kg, SpO2 95 %. Cardiovascular: RRR Pulmonary: Clear to auscultation bilaterally Abdomen: Soft, non tender, bowel sounds present. Extremities: No LE edema Wounds: Clean and dry.  No erythema or signs of infection.     Discharge Medications:   Allergies as of 04/13/2022       Reactions   Crestor [rosuvastatin] Other (See Comments)   Didn't work well   Sulfa Antibiotics Rash        Medication List     STOP taking these medications    azelastine 0.05 % ophthalmic solution Commonly known as: OPTIVAR   ketorolac 10 MG tablet Commonly known as: TORADOL   oxyCODONE-acetaminophen 5-325 MG tablet Commonly known as: PERCOCET/ROXICET       TAKE these medications    acetaminophen 650 MG CR tablet Commonly known as: TYLENOL Take 1,300 mg by mouth daily as needed for pain.   atorvastatin 80 MG tablet Commonly known as: LIPITOR Take 1 tablet (80 mg total) by mouth daily.   calcium carbonate 500 MG chewable tablet Commonly known as: TUMS - dosed in mg elemental calcium Chew 1 tablet by mouth as needed for indigestion or heartburn.   cholecalciferol 25 MCG (1000 UNIT) tablet Commonly known as:  VITAMIN D3 Take 1,000 Units by mouth daily.   docusate sodium 50 MG capsule Commonly known as: COLACE Take 50 mg by mouth See admin instructions. Take 50 mg daily, may take a second 50 mg dose in the evening as needed for constipation   hydrocortisone cream 1 % Apply 1 Application topically as needed for itching.   naproxen sodium 220 MG tablet Commonly known as: ALEVE Take 220-440 mg by mouth daily as needed (pain).   nicotine 21 mg/24hr patch Commonly known as: NICODERM CQ - dosed in mg/24 hours Place 1 patch (21 mg total) onto the skin daily.   oxyCODONE 5 MG immediate release tablet Commonly known as: Oxy IR/ROXICODONE Take 1 tablet (5 mg total) by mouth every 6 (six) hours as needed for severe pain.   oxymetazoline 0.05 % nasal spray Commonly known as: AFRIN Place 1 spray into both nostrils 2 (two) times daily.   predniSONE 20 MG tablet Commonly known as: DELTASONE Take 0.5 tablets (10 mg total) by mouth daily as needed for arthritic pain What changed: reasons to  take this   REFRESH OP Place 1 drop into both eyes daily as needed (irritation).   senna-docusate 8.6-50 MG tablet Commonly known as: Senokot-S Take 1 tablet by mouth 2 (two) times daily. While taking strongest pain meds to prevent constipation   Vitamin C 500 MG Caps Take 500 mg by mouth daily.        Follow-up Information     Lajuana Matte, MD. Go on 04/19/2022.   Specialty: Cardiothoracic Surgery Why: Appointmenti time is at 1:50 pm Contact information: [address] [place] [address] [phone]                 Signed:  Arnoldo Lenis  04/13/2022, 11:16 AM

## 2022-04-11 NOTE — Interval H&P Note (Signed)
History and Physical Interval Note:  04/11/2022 10:18 AM  Javier Vega  has presented today for surgery, with the diagnosis of PULMONARY NODULE.  The various methods of treatment have been discussed with the patient and family. After consideration of risks, benefits and other options for treatment, the patient has consented to  Procedure(s): XI ROBOTIC ASSISTED THORACOSCOPY-LEFT LOWER LOBE WEDGE RESECTION, possible lobectomy (Left) as a surgical intervention.  The patient's history has been reviewed, patient examined, no change in status, stable for surgery.  I have reviewed the patient's chart and labs.  Questions were answered to the patient's satisfaction.     Javier Vega

## 2022-04-11 NOTE — TOC Progression Note (Signed)
Transition of Care [facility]) - Progression Note    Patient Details  Name: Javier Vega MRN: [id number] Date of Birth: 02-Jan-1961  [facility]) CM/SW Contact  Zenon Mayo, RN Phone Number: 04/11/2022, 3:57 PM  Clinical Narrative:    From home with wife, s/p robotic lobectomy, chest tube in place.  TOC following.         Expected Discharge Plan and Services                                               Social Determinants of Health (SDOH) Interventions SDOH Screenings   Tobacco Use: High Risk (04/11/2022)    Readmission Risk Interventions     No data to display

## 2022-04-11 NOTE — Transfer of Care (Signed)
Immediate Anesthesia Transfer of Care Note  Patient: Javier Vega  Procedure(s) Performed: XI ROBOTIC ASSISTED THORACOSCOPY-LEFT LOWER LOBE RESECTION (Left: Chest)  Patient Location: PACU  Anesthesia Type:General  Level of Consciousness: drowsy and patient cooperative  Airway & Oxygen Therapy: Patient Spontanous Breathing and Patient connected to face mask oxygen  Post-op Assessment: Report given to RN and Post -op Vital signs reviewed and stable  Post vital signs: Reviewed and stable  Last Vitals:  Vitals Value Taken Time  BP 148/83 04/11/22 1428  Temp    Pulse 96 04/11/22 1431  Resp 15 04/11/22 1431  SpO2 91 % 04/11/22 1431  Vitals shown include unvalidated device data.  Last Pain:  Vitals:   04/11/22 0838  TempSrc:   PainSc: 0-No pain         Complications: No notable events documented.

## 2022-04-11 NOTE — Anesthesia Procedure Notes (Signed)
Procedure Name: Intubation Date/Time: 04/11/2022 10:59 AM  Performed by: Colin Benton, CRNAPre-anesthesia Checklist: Patient identified, Emergency Drugs available, Suction available and Patient being monitored Patient Re-evaluated:Patient Re-evaluated prior to induction Oxygen Delivery Method: Circle system utilized Preoxygenation: Pre-oxygenation with 100% oxygen Induction Type: IV induction Ventilation: Mask ventilation without difficulty Laryngoscope Size: Mac and 3 Grade View: Grade I Endobronchial tube: Left, Double lumen EBT, EBT position confirmed by auscultation and EBT position confirmed by fiberoptic bronchoscope and 37 Fr Number of attempts: 1 Airway Equipment and Method: Stylet Placement Confirmation: ETT inserted through vocal cords under direct vision, positive ETCO2 and breath sounds checked- equal and bilateral Tube secured with: Tape Dental Injury: Teeth and Oropharynx as per pre-operative assessment

## 2022-04-11 NOTE — Op Note (Signed)
[address]       [place] [address]             [phone]        04/11/2022  Patient:  Javier Vega Pre-Op Dx: Left lower lobe pulmonary nodule Post-op Dx: Left lower lobe non-small cell lung cancer Procedure: - Robotic assisted left video thoracoscopy -Left lower lobe wedge resection -Left lobectomy - Mediastinal lymph node sampling - Intercostal nerve block  Surgeon and Role:      * Rhyland Hinderliter, Lucile Crater, MD - Primary  Assistant: Josie Saunders, PA-C  An experienced assistant was required given the complexity of this surgery and the standard of surgical care. The assistant was needed for exposure, dissection, suctioning, retraction of delicate tissues and sutures, instrument exchange and for overall help during this procedure.    Anesthesia  general EBL: 100 ml Blood Administration: None Specimen: Left lower lobe wedge resection, left lower lobe, hilar and mediastinal nodes  Drains: 28 F argyle chest tube in left chest Counts: correct   Indications: 61 yo male with 1.7cm LLL pulm nodule.  We discussed the importance of smoking cessation, the patient states that he will stop today.  On review of the imaging the nodule in his peripheral and looks to abut the visceral pleura.  Think that this would be amenable to surgical biopsy, and if positive proceed with a lobectomy.  He is in agreement this plan.  He will require a stress test prior to surgery.  He is tentatively scheduled for a left robotic assisted thoracoscopy with wedge resection and possible left lower lobectomy.    Findings: Wedge resection was positive for non-small cell lung cancer  Operative Technique: After the risks, benefits and alternatives were thoroughly discussed, the patient was brought to the operative theatre.  Anesthesia was induced, and the patient was then placed in a lateral decubitus position and was prepped and draped in normal sterile fashion.  An appropriate surgical  pause was performed, and pre-operative antibiotics were dosed accordingly.  We began by placing our 4 robotic ports in the the 7th intercostal space targeting the hilum of the lung.  A 81mm assistant port was placed in the 9th intercostal space in the anterior axillary line.  The robot was then docked and all instruments were passed under direct visualization.    The lung was then retracted superiorly, and the inferior pulmonary ligament was divided.  Pleural indentation was noted in the left lower lobe.  A wedge resection was performed.  I was positive for non-small cell lung cancer.  The hilum was mobilized anteriorly and posteriorly.  We identified the lower lobe pulmonary vein, and after careful isolation, it was divided with a vascular stapler.  We next moved to the pulmonary artery.  The artery was then divided with a vascular load stapler.  The bronchus to the lower lobe was then isolated.  After a test clamp, with good ventilation of the remaining lung, the bronchus was then divided.  The fissure was completed, and the specimen was passed into an endocatch bag.  It was removed from the anterior access site.    Lymph nodes were then sampled at hilum and mediastinum.  The chest was irrigated, and an air leak test was performed.  An intercostal nerve block was performed under direct visualization.  A 28 F chest tube was then placed, and we watch the remaining lobes re-expand.  The skin and soft tissue were closed with absorbable  suture    The patient tolerated the procedure without any immediate complications, and was transferred to the PACU in stable condition.  Javier Vega Javier Vega

## 2022-04-11 NOTE — Hospital Course (Addendum)
HPI: This is a 61 y.o. male presents for surgical evaluation of a 1.7 cm left lower lobe pulmonary nodule.  He is a current smoker and this was originally identified in the lung cancer screening program.  He denies any shortness of breath or coughs.  He also denies any weight loss or neurologic symptoms.  Dr.Lightfoot discussed the need for Xi robotic assisted wedge LLL, possible LLL, LN dissection, and intercostal nerve block. Potential risks, benefits, and complications of the surgery were discussed with the patient and he agreed to proceed with surgery.   Hospital Course: Patient underwent a Xi robotic assisted left thoracoscopy, wedge LLL, LLL, LN dissection, and intercostal nerve block. Patient was extubated and transferred from the OR to PACU in stable condition. A line and foley were removed early in his post operative course. Chest tube was to water seal. Daily chest x rays were obtained and remained stable. Chest tube was clamped on post op day one. Follow up CXR showed small left apical pneumothorax. Chest tube was removed. Patient has been ambulating on room air with good oxygenation. All wounds are clean, dry, healing without signs of infection. Patient has been tolerating a diet. PA/LAT CXR 12/23 showed a tiny left apical pneumothorax (smaller). He is felt surgically stable for discharge. Of note, multiple pharmacies do not have Oxy 5 mg. Wife and patient instructed to go to [facility] at [place]. [employer] as they do have it.

## 2022-04-11 NOTE — Anesthesia Preprocedure Evaluation (Addendum)
Anesthesia Evaluation  Patient identified by MRN, date of birth, ID band Patient awake    Reviewed: Allergy & Precautions, NPO status , Patient's Chart, lab work & pertinent test results  Airway Mallampati: II       Dental   Pulmonary shortness of breath, sleep apnea , COPD, Current Smoker and Patient abstained from smoking.   breath sounds clear to auscultation       Cardiovascular hypertension, + dysrhythmias  Rhythm:Regular Rate:Normal     Neuro/Psych    GI/Hepatic Neg liver ROS,GERD  ,,  Endo/Other    Renal/GU Renal disease     Musculoskeletal  (+) Arthritis ,    Abdominal   Peds  Hematology   Anesthesia Other Findings   Reproductive/Obstetrics                             Anesthesia Physical Anesthesia Plan  ASA: 3  Anesthesia Plan: General   Post-op Pain Management: Tylenol PO (pre-op)*   Induction: Intravenous  PONV Risk Score and Plan: Ondansetron, Dexamethasone, Midazolam and Treatment may vary due to age or medical condition  Airway Management Planned: Oral ETT  Additional Equipment: Arterial line  Intra-op Plan:   Post-operative Plan: Possible Post-op intubation/ventilation  Informed Consent:      Dental advisory given  Plan Discussed with: CRNA and Anesthesiologist  Anesthesia Plan Comments:        Anesthesia Quick Evaluation

## 2022-04-11 NOTE — Anesthesia Procedure Notes (Signed)
Arterial Line Insertion Start/End12/21/2023 10:00 AM, 04/11/2022 10:05 AM Performed by: Renato Shin, CRNA, CRNA  Patient location: Pre-op. Preanesthetic checklist: patient identified, IV checked, site marked, risks and benefits discussed, surgical consent, monitors and equipment checked, pre-op evaluation, timeout performed and anesthesia consent Lidocaine 1% used for infiltration Left, radial was placed Catheter size: 20 G Hand hygiene performed , maximum sterile barriers used  and Seldinger technique used Allen's test indicative of satisfactory collateral circulation Attempts: 1 Procedure performed without using ultrasound guided technique. Following insertion, dressing applied and Biopatch. Post procedure assessment: normal and unchanged  Patient tolerated the procedure well with no immediate complications.

## 2022-04-12 ENCOUNTER — Inpatient Hospital Stay (HOSPITAL_COMMUNITY): Payer: 59

## 2022-04-12 LAB — CBC
HCT: 46.3 % (ref 39.0–52.0)
Hemoglobin: 16 g/dL (ref 13.0–17.0)
MCH: 31.6 pg (ref 26.0–34.0)
MCHC: 34.6 g/dL (ref 30.0–36.0)
MCV: 91.3 fL (ref 80.0–100.0)
Platelets: 259 10*3/uL (ref 150–400)
RBC: 5.07 MIL/uL (ref 4.22–5.81)
RDW: 13.1 % (ref 11.5–15.5)
WBC: 18.6 10*3/uL — ABNORMAL HIGH (ref 4.0–10.5)
nRBC: 0 % (ref 0.0–0.2)

## 2022-04-12 LAB — BASIC METABOLIC PANEL
Anion gap: 12 (ref 5–15)
BUN: 17 mg/dL (ref 8–23)
CO2: 23 mmol/L (ref 22–32)
Calcium: 9 mg/dL (ref 8.9–10.3)
Chloride: 101 mmol/L (ref 98–111)
Creatinine, Ser: 1.01 mg/dL (ref 0.61–1.24)
GFR, Estimated: >60 mL/min (ref >60)
Glucose, Bld: 136 mg/dL — ABNORMAL HIGH (ref 70–99)
Potassium: 3.6 mmol/L (ref 3.5–5.1)
Sodium: 136 mmol/L (ref 135–145)

## 2022-04-12 MED ORDER — POTASSIUM CHLORIDE CRYS ER 20 MEQ PO TBCR
40.0000 meq | EXTENDED_RELEASE_TABLET | Freq: Once | ORAL | Status: AC
  Administered 2022-04-12: 40 meq via ORAL
  Filled 2022-04-12: qty 2

## 2022-04-12 NOTE — Progress Notes (Addendum)
      [address]       [place] [address]             [phone]       1 Day Post-Op Procedure(s) (LRB): XI ROBOTIC ASSISTED THORACOSCOPY-LEFT LOWER LOBE RESECTION (Left)  Subjective: Patient states numb front/left side abdomen (from surgery/nerve block) and pain not too bad.  Objective: Vital signs in last 24 hours: Temp:  [97.5 F (36.4 C)-98.9 F (37.2 C)] 98.2 F (36.8 C) (12/22 0432) Pulse Rate:  [79-114] 79 (12/22 0432) Cardiac Rhythm: Normal sinus rhythm;Bundle branch block (12/21 2034) Resp:  [14-18] 14 (12/22 0432) BP: (115-166)/(73-104) 129/81 (12/22 0432) SpO2:  [93 %-98 %] 96 % (12/22 0432) Arterial Line BP: (126-164)/(64-85) 126/64 (12/21 1515) Weight:  [107.5 kg] 107.5 kg (12/21 0823)     Intake/Output from previous day: 12/21 0701 - 12/22 0700 In: 2349.4 [P.O.:480; I.V.:1600; IV Piggyback:269.4] Out: 1616 [Urine:1405; Blood:25; Chest Tube:186]   Physical Exam:  Cardiovascular: RRR Pulmonary: Clear to auscultation on right and rub on left (chest tube in place) Abdomen: Soft, non tender, bowel sounds present. Extremities: SCDs in place (but not attached to machine and not on) Wounds: Clean and dry.  No erythema or signs of infection. Chest Tube: to water seal, small air leak with cough  Lab Results: CBC: Recent Labs    04/09/22 1400 04/12/22 0012  WBC 10.2 18.6*  HGB 16.4 16.0  HCT 48.6 46.3  PLT 274 259   BMET:  Recent Labs    04/09/22 1400 04/12/22 0012  NA 139 136  K 4.2 3.6  CL 102 101  CO2 28 23  GLUCOSE 100* 136*  BUN 11 17  CREATININE 0.95 1.01  CALCIUM 9.6 9.0    PT/INR:  Recent Labs    04/09/22 1400  LABPROT 12.7  INR 1.0   ABG:  INR: Will add last result for INR, ABG once components are confirmed Will add last 4 CBG results once components are confirmed  Assessment/Plan:  1. CV - SR. 2.  Pulmonary - On room air this am.  Chest tube with 186 cc since surgery. Chest tube is to water seal,  small air leak with cough. CXR this am appears stable. Per Dr.Vinessa Macconnell, no air leak seen so will clamp chest tube. Check CXR and if stable, will either discharge patient later today or in amEncourage incentive spirometer. Await final pathology 3. Supplement potassium 4. On Lovenox for DVT prophylaxis  Donielle M ZimmermanPA-C 04/12/2022,7:38 AM  Small apical pneumothorax after chest tube removal. Will watch overnight.  Alvia Tory Bary Leriche

## 2022-04-12 NOTE — Plan of Care (Signed)
  Problem: Clinical Measurements: Goal: Respiratory complications will improve Outcome: Progressing Goal: Cardiovascular complication will be avoided Outcome: Progressing   Problem: Activity: Goal: Risk for activity intolerance will decrease Outcome: Progressing   Problem: Nutrition: Goal: Adequate nutrition will be maintained Outcome: Progressing   Problem: Elimination: Goal: Will not experience complications related to urinary retention Outcome: Progressing   Problem: Pain Managment: Goal: General experience of comfort will improve Outcome: Progressing

## 2022-04-12 NOTE — Progress Notes (Signed)
Mobility Specialist: Progress Note   04/12/22 1513  Mobility  Activity Ambulated with assistance in hallway  Level of Assistance Contact guard assist, steadying assist  Assistive Device None  Distance Ambulated (ft) 400 ft  Activity Response Tolerated well  Mobility Referral Yes  $Mobility charge 1 Mobility   Pre-Mobility: 93 HR, 97% SpO2 Post-Mobility: 97 HR, 97% SpO2  Pt received in the bed and agreeable to mobility. Mod I with bed mobility and contact guard for balance d/t lateral sway during ambulation. C/o Lt flank soreness, otherwise asymptomatic. Pt back to bed after session with call bell and phone in reach.   [place] Matin Mattioli Mobility Specialist Please contact via SecureChat or Rehab office at [phone]

## 2022-04-12 NOTE — Discharge Instructions (Signed)
Robot-Assisted Thoracic Surgery, Care After The following information offers guidance on how to care for yourself after your procedure. Your health care provider may also give you more specific instructions. If you have problems or questions, contact your health care provider. What can I expect after the procedure? After the procedure, it is common to have: Some pain and aches in the area of your surgical incisions. Pain when breathing in (inhaling) and coughing. Tiredness (fatigue). Trouble sleeping. Constipation. Follow these instructions at home: Medicines Take over-the-counter and prescription medicines only as told by your health care provider. If you were prescribed an antibiotic medicine, take it as told by your health care provider. Do not stop taking the antibiotic even if you start to feel better. Talk with your health care provider about safe and effective ways to manage pain after your procedure. Pain management should fit your specific health needs. Take pain medicine before pain becomes severe. Relieving and controlling your pain will make breathing easier for you. Ask your health care provider if the medicine prescribed to you requires you to avoid driving or using machinery. Eating and drinking Follow instructions from your health care provider about eating or drinking restrictions. These will vary depending on what procedure you had. Your health care provider may recommend: A liquid diet or soft diet for the first few days. Meals that are smaller and more frequent. A diet of fruits, vegetables, whole grains, and low-fat proteins. Limiting foods that are high in fat and processed sugar, including fried or sweet foods. Incision care Follow instructions from your health care provider about how to take care of your incisions. Make sure you: Wash your hands with soap and water for at least 20 seconds before and after you change your bandage (dressing). If soap and water are not  available, use hand sanitizer. Change your dressing as told by your health care provider. Leave stitches (sutures), skin glue, or adhesive strips in place. These skin closures may need to stay in place for 2 weeks or longer. If adhesive strip edges start to loosen and curl up, you may trim the loose edges. Do not remove adhesive strips completely unless your health care provider tells you to do that. Check your incision area every day for signs of infection. Check for: Redness, swelling, or more pain. Fluid or blood. Warmth. Pus or a bad smell. Activity Return to your normal activities as told by your health care provider. Ask your health care provider what activities are safe for you. Ask your health care provider when it is safe for you to drive. Do not lift anything that is heavier than 10 lb (4.5 kg), or the limit that you are told, until your health care provider says that it is safe. Rest as told by your health care provider. Avoid sitting for a long time without moving. Get up to take short walks every 1-2 hours. This is important to improve blood flow and breathing. Ask for help if you feel weak or unsteady. Do exercises as told by your health care provider. Pneumonia prevention  Do deep breathing exercises and cough regularly as directed. This helps clear mucus and opens your lungs. Doing this helps prevent lung infection (pneumonia). If you were given an incentive spirometer, use it as told. An incentive spirometer is a tool that measures how well you are filling your lungs with each breath. Coughing may hurt less if you try to support your chest. This is called splinting. Try one of these when you  cough: Hold a pillow against your chest. Place the palms of both hands on top of your incision area. Do not use any products that contain nicotine or tobacco. These products include cigarettes, chewing tobacco, and vaping devices, such as e-cigarettes. If you need help quitting, ask your  health care provider. Avoid secondhand smoke. General instructions If you have a drainage tube: Follow instructions from your health care provider about how to take care of it. Do not travel by airplane after your tube is removed until your health care provider tells you it is safe. You may need to take these actions to prevent or treat constipation: Drink enough fluid to keep your urine pale yellow. Take over-the-counter or prescription medicines. Eat foods that are high in fiber, such as beans, whole grains, and fresh fruits and vegetables. Limit foods that are high in fat and processed sugars, such as fried or sweet foods. Keep all follow-up visits. This is important. Contact a health care provider if: You have redness, swelling, or more pain around an incision. You have fluid or blood coming from an incision. An incision feels warm to the touch. You have pus or a bad smell coming from an incision. You have a fever. You cannot eat or drink without vomiting. Your pain medicine is not controlling your pain. Get help right away if: You have chest pain. Your heart is beating quickly. You have trouble breathing. You have trouble speaking. You are confused. You feel weak or dizzy, or you faint. These symptoms may represent a serious problem that is an emergency. Do not wait to see if the symptoms will go away. Get medical help right away. Call your local emergency services (911 in the [place] Do not drive yourself to the hospital. Summary Talk with your health care provider about safe and effective ways to manage pain after your procedure. Pain management should fit your specific health needs. Return to your normal activities as told by your health care provider. Ask your health care provider what activities are safe for you. Do deep breathing exercises and cough regularly as directed. This helps to clear mucus and prevent pneumonia. If it hurts to cough, ease pain by holding a pillow  against your chest or by placing the palms of both hands over your incisions. This information is not intended to replace advice given to you by your health care provider. Make sure you discuss any questions you have with your health care provider. Document Revised: 12/31/2019 Document Reviewed: 12/31/2019 Elsevier Patient Education  [employer].

## 2022-04-12 NOTE — Anesthesia Postprocedure Evaluation (Signed)
Anesthesia Post Note  Patient: TEVIN SHILLINGFORD  Procedure(s) Performed: XI ROBOTIC ASSISTED THORACOSCOPY-LEFT LOWER LOBE RESECTION (Left: Chest)     Patient location during evaluation: PACU Anesthesia Type: General Level of consciousness: awake Pain management: pain level controlled Vital Signs Assessment: post-procedure vital signs reviewed and stable Respiratory status: spontaneous breathing Cardiovascular status: stable Postop Assessment: no apparent nausea or vomiting Anesthetic complications: no   No notable events documented.  Last Vitals:  Vitals:   04/12/22 0432 04/12/22 0752  BP: 129/81 (!) 142/75  Pulse: 79 87  Resp: 14 (!) 23  Temp: 36.8 C 37 C  SpO2: 96% 96%    Last Pain:  Vitals:   04/12/22 0752  TempSrc: Oral  PainSc:                  Ronica Vivian

## 2022-04-13 ENCOUNTER — Inpatient Hospital Stay (HOSPITAL_COMMUNITY): Payer: 59

## 2022-04-13 LAB — COMPREHENSIVE METABOLIC PANEL
ALT: 15 U/L (ref 0–44)
AST: 19 U/L (ref 15–41)
Albumin: 3.2 g/dL — ABNORMAL LOW (ref 3.5–5.0)
Alkaline Phosphatase: 47 U/L (ref 38–126)
Anion gap: 7 (ref 5–15)
BUN: 21 mg/dL (ref 8–23)
CO2: 25 mmol/L (ref 22–32)
Calcium: 8.8 mg/dL — ABNORMAL LOW (ref 8.9–10.3)
Chloride: 107 mmol/L (ref 98–111)
Creatinine, Ser: 0.99 mg/dL (ref 0.61–1.24)
GFR, Estimated: >60 mL/min (ref >60)
Glucose, Bld: 122 mg/dL — ABNORMAL HIGH (ref 70–99)
Potassium: 3.8 mmol/L (ref 3.5–5.1)
Sodium: 139 mmol/L (ref 135–145)
Total Bilirubin: 0.4 mg/dL (ref 0.3–1.2)
Total Protein: 5.8 g/dL — ABNORMAL LOW (ref 6.5–8.1)

## 2022-04-13 LAB — CBC
HCT: 42.9 % (ref 39.0–52.0)
Hemoglobin: 14.4 g/dL (ref 13.0–17.0)
MCH: 31.6 pg (ref 26.0–34.0)
MCHC: 33.6 g/dL (ref 30.0–36.0)
MCV: 94.1 fL (ref 80.0–100.0)
Platelets: 232 10*3/uL (ref 150–400)
RBC: 4.56 MIL/uL (ref 4.22–5.81)
RDW: 13.2 % (ref 11.5–15.5)
WBC: 15 10*3/uL — ABNORMAL HIGH (ref 4.0–10.5)
nRBC: 0 % (ref 0.0–0.2)

## 2022-04-13 LAB — GLUCOSE, CAPILLARY: Glucose-Capillary: 121 mg/dL — ABNORMAL HIGH (ref 70–99)

## 2022-04-13 MED ORDER — OXYCODONE HCL 5 MG PO TABS: 5.0000 mg | ORAL_TABLET | Freq: Four times a day (QID) | ORAL | 0 refills | Status: DC | PRN

## 2022-04-13 NOTE — Progress Notes (Signed)
      [address]       [place] [address]             [phone]       2 Days Post-Op Procedure(s) (LRB): XI ROBOTIC ASSISTED THORACOSCOPY-LEFT LOWER LOBE RESECTION (Left)  Subjective: Patient without complaints;wants to go home.  Objective: Vital signs in last 24 hours: Temp:  [97.7 F (36.5 C)-98.1 F (36.7 C)] 97.9 F (36.6 C) (12/23 0333) Pulse Rate:  [77-79] 77 (12/22 1950) Cardiac Rhythm: Normal sinus rhythm (12/22 1950) Resp:  [17-19] 19 (12/22 1950) BP: (119-150)/(72-95) 130/89 (12/23 0333) SpO2:  [95 %] 95 % (12/22 1614)     Intake/Output from previous day: 12/22 0701 - 12/23 0700 In: 960 [P.O.:960] Out: 0    Physical Exam:  Cardiovascular: RRR Pulmonary: Clear to auscultation bilaterally Abdomen: Soft, non tender, bowel sounds present. Extremities: No LE edema Wounds: Clean and dry.  No erythema or signs of infection.   Lab Results: CBC: Recent Labs    04/12/22 0012 04/13/22 0017  WBC 18.6* 15.0*  HGB 16.0 14.4  HCT 46.3 42.9  PLT 259 232    BMET:  Recent Labs    04/12/22 0012 04/13/22 0017  NA 136 139  K 3.6 3.8  CL 101 107  CO2 23 25  GLUCOSE 136* 122*  BUN 17 21  CREATININE 1.01 0.99  CALCIUM 9.0 8.8*     PT/INR:  No results for input(s): "LABPROT", "INR" in the last 72 hours.  ABG:  INR: Will add last result for INR, ABG once components are confirmed Will add last 4 CBG results once components are confirmed  Assessment/Plan:  1. CV - SR. 2.  Pulmonary - On room air this am. CXR this am appears stable.(Small left apical pneumothorax). Encourage incentive spirometer. Await final pathology 3. Supplement potassium 4. On Lovenox for DVT prophylaxis 5. Discharge once CXR officially interpreted  Annalise Mcdiarmid M ZimmermanPA-C 04/13/2022,9:36 AM

## 2022-04-13 NOTE — Plan of Care (Signed)
  Problem: Clinical Measurements: Goal: Respiratory complications will improve Outcome: Progressing Goal: Cardiovascular complication will be avoided Outcome: Progressing   Problem: Activity: Goal: Risk for activity intolerance will decrease Outcome: Progressing   Problem: Coping: Goal: Level of anxiety will decrease Outcome: Progressing   Problem: Elimination: Goal: Will not experience complications related to urinary retention Outcome: Progressing   Problem: Pain Managment: Goal: General experience of comfort will improve Outcome: Progressing

## 2022-04-13 NOTE — Progress Notes (Signed)
Mobility Specialist Progress Note:   04/13/22 0924  Mobility  Activity Ambulated with assistance in hallway  Level of Assistance Standby assist, set-up cues, supervision of patient - no hands on  Assistive Device Front wheel walker  Distance Ambulated (ft) 800 ft  Activity Response Tolerated well  Mobility Referral Yes  $Mobility charge 1 Mobility   Pt received in bed and eager. C/o soreness at incision site but no c/o pain or SOB. Pt left in bed with all needs met, call bell in reach, and family in room.   Andrey Campanile Mobility Specialist Please contact via SecureChat or  Rehab office at [phone]

## 2022-04-17 ENCOUNTER — Other Ambulatory Visit (HOSPITAL_COMMUNITY): Payer: Self-pay

## 2022-04-17 MED ORDER — PREDNISONE 20 MG PO TABS
10.0000 mg | ORAL_TABLET | Freq: Every day | ORAL | 3 refills | Status: DC | PRN
  Filled 2022-04-17: qty 15, 30d supply, fill #0
  Filled 2022-05-16: qty 15, 30d supply, fill #1
  Filled 2022-06-14: qty 15, 30d supply, fill #2
  Filled 2022-12-10: qty 15, 30d supply, fill #3

## 2022-04-18 LAB — SURGICAL PATHOLOGY

## 2022-04-19 ENCOUNTER — Other Ambulatory Visit (HOSPITAL_COMMUNITY): Payer: Self-pay

## 2022-04-19 ENCOUNTER — Ambulatory Visit (INDEPENDENT_AMBULATORY_CARE_PROVIDER_SITE_OTHER): Payer: Self-pay | Admitting: Thoracic Surgery (Cardiothoracic Vascular Surgery)

## 2022-04-19 ENCOUNTER — Encounter: Payer: Self-pay | Admitting: Thoracic Surgery (Cardiothoracic Vascular Surgery)

## 2022-04-19 VITALS — BP 154/90 | HR 80 | Resp 20 | Ht 68.0 in | Wt 240.0 lb

## 2022-04-19 DIAGNOSIS — R911 Solitary pulmonary nodule: Secondary | ICD-10-CM

## 2022-04-19 MED ORDER — OXYCODONE HCL 5 MG PO TABS
5.0000 mg | ORAL_TABLET | Freq: Four times a day (QID) | ORAL | 0 refills | Status: DC | PRN
  Filled 2022-04-19: qty 30, 8d supply, fill #0

## 2022-04-23 NOTE — Progress Notes (Signed)
      [address]       [place] [address]             [phone]        Shamere K Bucio [place] Medical Record #[id number] Date of Birth: [birth year]  Referring: Garner Nash, DO Primary Care: Kathalene Frames, MD Primary Cardiologist:Traci Radford Pax, MD  Reason for visit:   follow-up  History of Present Illness:     62 year old male presents for his 1 week follow-up appointment.  Overall he is doing well.  He denies any chest pain or shortness of breath.  Physical Exam: BP (!) 154/90 (BP Location: Left Arm, Patient Position: Sitting, Cuff Size: Normal)   Pulse 80   Resp 20   Ht 5\' 8"  (1.727 m)   Wt 240 lb (108.9 kg)   SpO2 95% Comment: RA  BMI 36.49 kg/m   Alert NAD Incision clean.   Abdomen, ND No peripheral edema   Diagnostic Studies & Laboratory data:  Path:  FINAL MICROSCOPIC DIAGNOSIS:  A. LEFT LUNG, LOWER LOBE, WEDGE RESECTION: Focal atypical adenomatous hyperplasia (see comment) Granulomatous inflammation, focally caseating Acid-fast bacilli and fungal stains negative  B. LYMPH NODE, LEVEL 9, EXCISION: One benign lymph node, negative for carcinoma (0/1)  C. LYMPH NODE, LEVEL 7, EXCISION: One benign lymph node, negative for carcinoma (0/1)  D. LYMPH NODE, HILAR, EXCISION: One benign lymph node, negative for carcinoma (0/1)  E. LYMPH NODE, LEVEL 6, EXCISION: One benign lymph node, negative for carcinoma (0/1)  F. LYMPH NODE, LEVEL 5, EXCISION: One benign lymph node, negative for carcinoma (0/1)  G. LEFT LUNG, LOWER LOBE, LOBECTOMY: Benign lung with emphysematous changes Four benign lymph nodes, negative for carcinoma (0/4)  COMMENT: The granulomatous inflammation is not present within the frozen control slides.  Sections including deepers of the frozen control slide show lung with fibrous and fibroelastotic stroma with focal entrapped irregular angulated bronchioles and alveolar spaces lined by atypical pneumocytes  and bronchial epithelium.  These findings are compatible with focal atypical adenomatous hyperplasia which can mimic a well-differentiated adenocarcinoma on frozen section and is presumptively considered to be precursor lesion for adenocarcinoma. Special stains for acid-fast bacilli and fungal organisms (AFB, GMS) are negative with adequate control.  Case is reviewed by Dr. Saralyn Pilar who concurs with the diagnosis.    INTRAOPERATIVE DIAGNOSIS: A.  Left lower lobe wedge: "Adenocarcinoma" Intraoperative diagnosis rendered by Dr. Alric Seton at 12:41 PM on 04/11/2022.     Assessment / Plan:   63 year old male status post robotic assisted left lower lobectomy.  Originally the frozen section on the wedge resection was read as an adenocarcinoma, but unfortunately the final pathology was consistent with atypical cells.  We will review this case in our tumor board to ensure the correct read.  He will follow-up in 1 month with a chest x-ray   Lajuana Matte 04/23/2022 11:04 AM

## 2022-04-24 ENCOUNTER — Telehealth: Payer: Self-pay

## 2022-04-24 NOTE — Telephone Encounter (Signed)
-----   Message from Lajuana Matte, MD sent at 04/24/2022  3:41 PM EST ----- Regarding: RE: pain med refill request Contact: [phone] I gave him a refill last week.  This is his last refill.  He needs to start using tylenol and ibuprofen  Thanks,  H. ----- Message ----- From: Marylen Ponto, LPN Sent: 7/0/7867  54:49 PM EST To: Lajuana Matte, MD Subject: pain med refill request                        Javier Vega is calling for refill on OxyCodone 5mg . He states he will be out on Friday this week and would like one more refill due to Chest wall pain. His pharm is noted in Epic Chart. Please advise. SW

## 2022-04-25 ENCOUNTER — Other Ambulatory Visit: Payer: Self-pay | Admitting: Thoracic Surgery (Cardiothoracic Vascular Surgery)

## 2022-04-25 ENCOUNTER — Other Ambulatory Visit: Payer: Self-pay

## 2022-04-25 ENCOUNTER — Other Ambulatory Visit (HOSPITAL_COMMUNITY): Payer: Self-pay

## 2022-04-25 NOTE — Progress Notes (Signed)
The proposed treatment discussed in conference is for discussion purpose only and is not a binding recommendation.  The patients have not been physically examined, or presented with their treatment options.  Therefore, final treatment plans cannot be decided.  

## 2022-04-26 ENCOUNTER — Telehealth: Payer: Self-pay

## 2022-04-26 NOTE — Telephone Encounter (Signed)
Patient contacted the office requesting a refill of pain medication. Spoke with Dr. Kipp Brood and referred to previous note that states patient has already received his last refill. Patient made aware and acknowledged receipt.

## 2022-04-30 ENCOUNTER — Telehealth: Payer: Self-pay

## 2022-04-30 NOTE — Telephone Encounter (Signed)
FMLA form completed and faxed to [employer] @[phone]. Beginning LOA 04/11/22 through 06/10/22.

## 2022-05-02 DIAGNOSIS — Z03818 Encounter for observation for suspected exposure to other biological agents ruled out: Secondary | ICD-10-CM | POA: Diagnosis not present

## 2022-05-02 DIAGNOSIS — J069 Acute upper respiratory infection, unspecified: Secondary | ICD-10-CM | POA: Diagnosis not present

## 2022-05-06 DIAGNOSIS — M25562 Pain in left knee: Secondary | ICD-10-CM | POA: Diagnosis not present

## 2022-05-06 DIAGNOSIS — M25561 Pain in right knee: Secondary | ICD-10-CM | POA: Diagnosis not present

## 2022-05-06 DIAGNOSIS — G894 Chronic pain syndrome: Secondary | ICD-10-CM | POA: Diagnosis not present

## 2022-05-16 ENCOUNTER — Other Ambulatory Visit (HOSPITAL_COMMUNITY): Payer: Self-pay

## 2022-05-17 ENCOUNTER — Emergency Department (HOSPITAL_BASED_OUTPATIENT_CLINIC_OR_DEPARTMENT_OTHER)
Admission: EM | Admit: 2022-05-17 | Discharge: 2022-05-17 | Disposition: A | Discharge status: Home / Self Care | Payer: Commercial Managed Care - PPO | Attending: Emergency Medicine | Admitting: Emergency Medicine

## 2022-05-17 ENCOUNTER — Telehealth: Payer: Self-pay | Admitting: *Deleted

## 2022-05-17 ENCOUNTER — Emergency Department (HOSPITAL_BASED_OUTPATIENT_CLINIC_OR_DEPARTMENT_OTHER): Payer: Commercial Managed Care - PPO | Admitting: Radiology

## 2022-05-17 ENCOUNTER — Other Ambulatory Visit: Payer: Self-pay

## 2022-05-17 DIAGNOSIS — J101 Influenza due to other identified influenza virus with other respiratory manifestations: Secondary | ICD-10-CM | POA: Diagnosis not present

## 2022-05-17 DIAGNOSIS — Z20822 Contact with and (suspected) exposure to covid-19: Secondary | ICD-10-CM | POA: Diagnosis not present

## 2022-05-17 DIAGNOSIS — J811 Chronic pulmonary edema: Secondary | ICD-10-CM | POA: Diagnosis not present

## 2022-05-17 DIAGNOSIS — R0602 Shortness of breath: Secondary | ICD-10-CM | POA: Diagnosis not present

## 2022-05-17 LAB — CBC WITH DIFFERENTIAL/PLATELET
Abs Immature Granulocytes: 0.04 10*3/uL (ref 0.00–0.07)
Basophils Absolute: 0 10*3/uL (ref 0.0–0.1)
Basophils Relative: 0 %
Eosinophils Absolute: 0 10*3/uL (ref 0.0–0.5)
Eosinophils Relative: 0 %
HCT: 48.5 % (ref 39.0–52.0)
Hemoglobin: 16.4 g/dL (ref 13.0–17.0)
Immature Granulocytes: 1 %
Lymphocytes Relative: 3 %
Lymphs Abs: 0.3 10*3/uL — ABNORMAL LOW (ref 0.7–4.0)
MCH: 31 pg (ref 26.0–34.0)
MCHC: 33.8 g/dL (ref 30.0–36.0)
MCV: 91.7 fL (ref 80.0–100.0)
Monocytes Absolute: 1.1 10*3/uL — ABNORMAL HIGH (ref 0.1–1.0)
Monocytes Relative: 13 %
Neutro Abs: 7.3 10*3/uL (ref 1.7–7.7)
Neutrophils Relative %: 83 %
Platelets: 217 10*3/uL (ref 150–400)
RBC: 5.29 MIL/uL (ref 4.22–5.81)
RDW: 13.1 % (ref 11.5–15.5)
WBC: 8.8 10*3/uL (ref 4.0–10.5)
nRBC: 0 % (ref 0.0–0.2)

## 2022-05-17 LAB — RESP PANEL BY RT-PCR (RSV, FLU A&B, COVID)  RVPGX2
Influenza A by PCR: POSITIVE — AB
Influenza B by PCR: NEGATIVE
Resp Syncytial Virus by PCR: NEGATIVE
SARS Coronavirus 2 by RT PCR: NEGATIVE

## 2022-05-17 LAB — BASIC METABOLIC PANEL
Anion gap: 12 (ref 5–15)
BUN: 15 mg/dL (ref 8–23)
CO2: 27 mmol/L (ref 22–32)
Calcium: 9.8 mg/dL (ref 8.9–10.3)
Chloride: 96 mmol/L — ABNORMAL LOW (ref 98–111)
Creatinine, Ser: 0.94 mg/dL (ref 0.61–1.24)
GFR, Estimated: >60 mL/min (ref >60)
Glucose, Bld: 131 mg/dL — ABNORMAL HIGH (ref 70–99)
Potassium: 3.7 mmol/L (ref 3.5–5.1)
Sodium: 135 mmol/L (ref 135–145)

## 2022-05-17 MED ORDER — AEROCHAMBER PLUS FLO-VU MEDIUM MISC
1.0000 | Freq: Once | Status: AC
  Administered 2022-05-17: 1
  Filled 2022-05-17: qty 10

## 2022-05-17 MED ORDER — ACETAMINOPHEN 325 MG PO TABS
650.0000 mg | ORAL_TABLET | Freq: Once | ORAL | Status: AC | PRN
  Administered 2022-05-17: 650 mg via ORAL
  Filled 2022-05-17: qty 2

## 2022-05-17 MED ORDER — OSELTAMIVIR PHOSPHATE 75 MG PO CAPS: 75.0000 mg | ORAL_CAPSULE | Freq: Two times a day (BID) | ORAL | 0 refills | Status: AC

## 2022-05-17 MED ORDER — METHYLPREDNISOLONE SODIUM SUCC 125 MG IJ SOLR
125.0000 mg | Freq: Once | INTRAMUSCULAR | Status: AC
  Administered 2022-05-17: 125 mg via INTRAVENOUS
  Filled 2022-05-17: qty 2

## 2022-05-17 MED ORDER — BENZONATATE 100 MG PO CAPS: 100.0000 mg | ORAL_CAPSULE | Freq: Three times a day (TID) | ORAL | 0 refills | Status: AC

## 2022-05-17 MED ORDER — IBUPROFEN 800 MG PO TABS
800.0000 mg | ORAL_TABLET | Freq: Once | ORAL | Status: AC
  Administered 2022-05-17: 800 mg via ORAL
  Filled 2022-05-17: qty 1

## 2022-05-17 MED ORDER — ALBUTEROL SULFATE HFA 108 (90 BASE) MCG/ACT IN AERS
2.0000 | INHALATION_SPRAY | RESPIRATORY_TRACT | Status: DC | PRN
  Administered 2022-05-17: 2 via RESPIRATORY_TRACT
  Filled 2022-05-17: qty 6.7

## 2022-05-17 MED ORDER — IPRATROPIUM-ALBUTEROL 0.5-2.5 (3) MG/3ML IN SOLN
3.0000 mL | RESPIRATORY_TRACT | Status: DC
  Administered 2022-05-17: 3 mL via RESPIRATORY_TRACT
  Filled 2022-05-17: qty 3

## 2022-05-17 MED ORDER — ALBUTEROL SULFATE (2.5 MG/3ML) 0.083% IN NEBU
5.0000 mg | INHALATION_SOLUTION | Freq: Once | RESPIRATORY_TRACT | Status: AC
  Administered 2022-05-17: 5 mg via RESPIRATORY_TRACT
  Filled 2022-05-17: qty 6

## 2022-05-17 MED ORDER — METHYLPREDNISOLONE 4 MG PO TBPK: ORAL_TABLET | ORAL | 0 refills | Status: AC

## 2022-05-17 MED ORDER — SODIUM CHLORIDE 0.9 % IV BOLUS
1000.0000 mL | Freq: Once | INTRAVENOUS | Status: AC
  Administered 2022-05-17: 1000 mL via INTRAVENOUS

## 2022-05-17 MED ORDER — MAGNESIUM SULFATE 2 GM/50ML IV SOLN
2.0000 g | Freq: Once | INTRAVENOUS | Status: AC
  Administered 2022-05-17: 2 g via INTRAVENOUS
  Filled 2022-05-17: qty 50

## 2022-05-17 NOTE — ED Triage Notes (Signed)
Pt had 1/2 left lower lobe surgically removed on Apr 02, 2022, clear diminished in bases per respiratory. For past 3 days pt reports SOB with even light activity.  +productive cough.  Pt has tried vicks 44 at home and took tylenol at approx 6am today with no relief to SOB.

## 2022-05-17 NOTE — Telephone Encounter (Signed)
Patient contacted the office with c/o SOB and cough. Patient is s/p RAT's left lung resection 12/21 by Dr. Kipp Brood. Per patient, he will begin coughing and has a difficult time catching his breath. Patient states he is having a difficult time breathing at rest.  Patient states he feels like he also has a fever, however he has not checked. Patient reports these symptoms began approximately three days ago. Advised patient to seek care via emergency room due to his increased shortness of breath. Patient verbalized understanding.

## 2022-05-17 NOTE — Discharge Instructions (Addendum)
Use the inhaler 2 puffs every 4 hours and your AeroChamber Contact a health care provider if: You develop new symptoms. You have: Chest pain. Diarrhea. A fever. Your cough gets worse. You produce more mucus. You feel nauseous or you vomit. Get help right away if you: Develop shortness of breath or have difficulty breathing. Have skin or nails that turn a bluish color. Have severe pain or stiffness in your neck. Develop a sudden headache or sudden pain in your face or ear. Cannot eat or drink without vomiting. These symptoms may represent a serious problem that is an emergency. Do not wait to see if the symptoms will go away. Get medical help right away. Call your local emergency services (911 in the [place] Do not drive yourself to the hospital.

## 2022-05-17 NOTE — ED Notes (Signed)
Patient was educated on the use of a spacer with the MDI ordered

## 2022-05-17 NOTE — ED Notes (Signed)
Patient saturation while on room air at rest= 95%  Patient saturation on room air while ambulating= 96%  Patient post ambulating, sitting= 95%  Patient had stable vitals and in no distress

## 2022-05-17 NOTE — ED Provider Notes (Signed)
[facility] Provider Note   CSN: [id number] Arrival date & time: 05/17/22  1324     History  Chief Complaint  Patient presents with   Shortness of Breath    Javier Vega is a 62 y.o. male who presents emergency department with a chief complaint of wheezing and shortness of breath.  He had a left lower lobectomy on December 12.  He is still out of work.  He states he has had some shortness of breath since that time however suddenly he felt very sick a couple of days ago.  He has a progressively worsening cough and states that he coughs to the point of gagging and is wheezing.  He has "pretty much cut out all smoking."  He states he might have 1 or 2 every once in a while.  He was noted to have a fever prior to arrival states that he feels "pretty awful."  He denies nausea vomiting or diarrhea   Shortness of Breath      Home Medications Prior to Admission medications   Medication Sig Start Date End Date Taking? Authorizing Provider  acetaminophen (TYLENOL) 650 MG CR tablet Take 1,300 mg by mouth daily as needed for pain.    [provider]  Ascorbic Acid (VITAMIN C) 500 MG CAPS Take 500 mg by mouth daily.    [provider]  atorvastatin (LIPITOR) 80 MG tablet Take 1 tablet (80 mg total) by mouth daily. 07/10/21     calcium carbonate (TUMS - DOSED IN MG ELEMENTAL CALCIUM) 500 MG chewable tablet Chew 1 tablet by mouth as needed for indigestion or heartburn.    [provider]  cholecalciferol (VITAMIN D3) 25 MCG (1000 UT) tablet Take 1,000 Units by mouth daily.    [provider]  docusate sodium (COLACE) 50 MG capsule Take 50 mg by mouth See admin instructions. Take 50 mg daily, may take a second 50 mg dose in the evening as needed for constipation    [provider]  hydrocortisone cream 1 % Apply 1 Application topically as needed for itching.    [provider]  naproxen sodium (ALEVE)  220 MG tablet Take 220-440 mg by mouth daily as needed (pain).    [provider]  nicotine (NICODERM CQ - DOSED IN MG/24 HOURS) 21 mg/24hr patch Place 1 patch (21 mg total) onto the skin daily. 04/05/22   Jefm Bryant, RPH  oxyCODONE (OXY IR/ROXICODONE) 5 MG immediate release tablet Take 1 tablet (5 mg total) by mouth every 6 (six) hours as needed for severe pain. 04/19/22   Lightfoot, Lucile Crater, MD  oxymetazoline (AFRIN) 0.05 % nasal spray Place 1 spray into both nostrils 2 (two) times daily.    [provider]  Polyvinyl Alcohol-Povidone (REFRESH OP) Place 1 drop into both eyes daily as needed (irritation).    [provider]  predniSONE (DELTASONE) 20 MG tablet Take 0.5 tablets (10 mg total) by mouth once daily as needed. 04/17/22     senna-docusate (SENOKOT-S) 8.6-50 MG tablet Take 1 tablet by mouth 2 (two) times daily. While taking strongest pain meds to prevent constipation 02/21/22   Alexis Frock, MD      Allergies    Crestor [rosuvastatin] and Sulfa antibiotics    Review of Systems   Review of Systems  Respiratory:  Positive for shortness of breath.     Physical Exam Updated Vital Signs BP (!) 144/83 (BP Location: Right Arm)  Pulse (!) 114   Temp (!) 101.6 F (38.7 C) (Oral)   Resp (!) 24   SpO2 97%  Physical Exam  ED Results / Procedures / Treatments   Labs (all labs ordered are listed, but only abnormal results are displayed) Labs Reviewed  RESP PANEL BY RT-PCR (RSV, FLU A&B, COVID)  RVPGX2 - Abnormal; Notable for the following components:      Result Value   Influenza A by PCR POSITIVE (*)    All other components within normal limits    EKG EKG interpretation Date/Time:  Friday May 17 2022 13:34:12 EST Ventricular Rate:  118 PR Interval:  122 QRS Duration: 134 QT Interval:  350 QTC Calculation: 490 R Axis:   -82 Text Interpretation: Sinus tachycardia Right bundle branch block Left anterior fascicular block  Bifascicular  block Abnormal ECG When compared with ECG of 09-Apr-2022 13:52, Vent. rate has increased BY  43 BPM Overall bundle branch pattern stable from Apr 09 2022 Confirmed by Octaviano Glow [phone]) on 05/17/2022 3:10:44 PM  Radiology DG Chest 2 View  Result Date: 05/17/2022 CLINICAL DATA:  Shortness of breath. EXAM: CHEST - 2 VIEW COMPARISON:  04/13/2022 FINDINGS: Grossly normal sized heart. Mildly tortuous and calcified thoracic aorta. Interval air in the gastric fundus with increased elevation of the left hemidiaphragm. Interval mild prominence of the pulmonary vasculature. Clear lungs. Thoracic spine degenerative changes, including changes of DISH. IMPRESSION: 1. Interval mild pulmonary vascular congestion. 2. Increased elevation of the left hemidiaphragm. Electronically Signed   By: Claudie Revering M.D.   On: 05/17/2022 14:06    Procedures Procedures    Medications Ordered in ED Medications  albuterol (VENTOLIN HFA) 108 (90 Base) MCG/ACT inhaler 2 puff (2 puffs Inhalation Given 05/17/22 1403)  acetaminophen (TYLENOL) tablet 650 mg (650 mg Oral Given 05/17/22 1343)  AeroChamber Plus Flo-Vu Medium MISC 1 each (1 each Other Given 05/17/22 1406)    ED Course/ Medical Decision Making/ A&P Clinical Course as of 05/17/22 1903  Fri May 17, 2022  1818 Influenza A By PCR(!): POSITIVE [AH]  1818 CBC with Differential(!) [AH]  0240 Basic metabolic panel(!) [AH]    Clinical Course User Index [AH] Margarita Mail, PA-C                             Medical Decision Making 62 year old male who presents emergency department with flulike symptoms, cough and shortness of breath.  Initially patient was febrile, tachycardic, tachypneic all in sitting with his flu.  Comorbidities affecting decision making include recent left lower lobe lobectomy. Patient also continues to smoke cigarettes  The patient was counseled on the dangers of tobacco use, and was advised to quit. Reviewed strategies to maximize success,  including removing cigarettes and smoking materials from environment, stress management, substitution of other forms of reinforcement, support of family/friends and written materials.   Patient treated for asthma type symptoms here with significant improvement in his breathing.  Initially I felt the patient might need admission due to some hypoxia however this resolved after a final treatment and he was able to ambulate in the emergency department without hypoxia.  Patient states that he is feeling much better than he was when he came in.  I reviewed patient's labs which shows no elevated white blood cell count.  BMP with slightly elevated blood glucose, respiratory panel is positive for flu A.  I visualized and interpreted two-view chest x-ray which shows no evidence of pneumonia.  Patient will be discharged with treatment with Tamiflu, Medrol, albuterol and Tessalon.  Discussed outpatient follow-up and return precautions.  Amount and/or Complexity of Data Reviewed Labs: ordered. Decision-making details documented in ED Course. Radiology: ordered.  Risk OTC drugs. Prescription drug management.           Final Clinical Impression(s) / ED Diagnoses Final diagnoses:  Influenza A    Rx / DC Orders ED Discharge Orders     None         Margarita Mail, PA-C 05/17/22 1943    Blanchie Dessert, MD 05/18/22 [phone]

## 2022-05-23 ENCOUNTER — Other Ambulatory Visit: Payer: Self-pay | Admitting: Thoracic Surgery (Cardiothoracic Vascular Surgery)

## 2022-05-23 ENCOUNTER — Other Ambulatory Visit (HOSPITAL_COMMUNITY): Payer: Self-pay

## 2022-05-23 DIAGNOSIS — C3490 Malignant neoplasm of unspecified part of unspecified bronchus or lung: Secondary | ICD-10-CM

## 2022-05-24 ENCOUNTER — Ambulatory Visit (INDEPENDENT_AMBULATORY_CARE_PROVIDER_SITE_OTHER): Payer: Self-pay | Admitting: Thoracic Surgery (Cardiothoracic Vascular Surgery)

## 2022-05-24 ENCOUNTER — Ambulatory Visit
Admission: RE | Admit: 2022-05-24 | Discharge: 2022-05-24 | Disposition: A | Discharge status: Home / Self Care | Payer: Commercial Managed Care - PPO | Source: Ambulatory Visit | Attending: Thoracic Surgery (Cardiothoracic Vascular Surgery) | Admitting: Thoracic Surgery (Cardiothoracic Vascular Surgery)

## 2022-05-24 ENCOUNTER — Other Ambulatory Visit (HOSPITAL_COMMUNITY): Payer: Self-pay

## 2022-05-24 ENCOUNTER — Encounter: Payer: Self-pay | Admitting: Thoracic Surgery (Cardiothoracic Vascular Surgery)

## 2022-05-24 VITALS — BP 145/83 | HR 79 | Resp 20 | Ht 68.0 in | Wt 228.0 lb

## 2022-05-24 DIAGNOSIS — I700 Atherosclerosis of aorta: Secondary | ICD-10-CM | POA: Diagnosis not present

## 2022-05-24 DIAGNOSIS — C3490 Malignant neoplasm of unspecified part of unspecified bronchus or lung: Secondary | ICD-10-CM | POA: Diagnosis not present

## 2022-05-24 DIAGNOSIS — R911 Solitary pulmonary nodule: Secondary | ICD-10-CM

## 2022-05-24 NOTE — Progress Notes (Unsigned)
      [address]       [place] [address]             [phone]        Franciscojavier K Swett [place] Medical Record #[id number] Date of Birth: 1961/02/14  Referring: Garner Nash, DO Primary Care: Kathalene Frames, MD Primary Cardiologist:Traci Radford Pax, MD  Reason for visit:   follow-up  History of Present Illness:     62yo male presents for his 33month follow-up.  Overall he is doing well.  He continues to have some shortness of breath, but states that this is unchanged from prior to surgery.  Physical Exam: BP (!) 145/83 (BP Location: Left Arm, Patient Position: Sitting, Cuff Size: Normal)   Pulse 79   Resp 20   Ht 5\' 8"  (1.727 m)   Wt 228 lb (103.4 kg)   SpO2 97% Comment: RA  BMI 34.67 kg/m   Alert NAD Incision clean.  Abdomen, ND no peripheral edema   Diagnostic Studies & Laboratory data: CXR: clear     Assessment / Plan:   20-year-old male status post robotic assisted left lower lobectomy. Originally the frozen section on the wedge resection was read as an adenocarcinoma, but unfortunately the final pathology was consistent with atypical cells. He doesn't feel ready to return to work due to his dyspnea.  I have encouraged more ambulation and activity.  We will check on his work status in 1 month.   Lajuana Matte 05/24/2022 1:43 PM       March 1st

## 2022-06-12 ENCOUNTER — Telehealth: Payer: Self-pay

## 2022-06-12 NOTE — Telephone Encounter (Signed)
Matrix was updated on RTW date of 06/24/2022. Notes faxed to [phone]/attention [facility] Simon/HR

## 2022-06-14 ENCOUNTER — Other Ambulatory Visit (HOSPITAL_COMMUNITY): Payer: Self-pay

## 2022-06-24 ENCOUNTER — Other Ambulatory Visit (HOSPITAL_COMMUNITY): Payer: Self-pay

## 2022-06-25 ENCOUNTER — Other Ambulatory Visit (HOSPITAL_COMMUNITY): Payer: Self-pay

## 2022-06-25 MED ORDER — TAMSULOSIN HCL 0.4 MG PO CAPS
0.4000 mg | ORAL_CAPSULE | Freq: Every day | ORAL | 0 refills | Status: DC
  Filled 2022-06-25: qty 30, 30d supply, fill #0

## 2022-06-27 ENCOUNTER — Other Ambulatory Visit (HOSPITAL_COMMUNITY): Payer: Self-pay

## 2022-07-02 DIAGNOSIS — H2513 Age-related nuclear cataract, bilateral: Secondary | ICD-10-CM | POA: Diagnosis not present

## 2022-07-02 DIAGNOSIS — H04123 Dry eye syndrome of bilateral lacrimal glands: Secondary | ICD-10-CM | POA: Diagnosis not present

## 2022-07-02 DIAGNOSIS — H10413 Chronic giant papillary conjunctivitis, bilateral: Secondary | ICD-10-CM | POA: Diagnosis not present

## 2022-07-02 DIAGNOSIS — H0288B Meibomian gland dysfunction left eye, upper and lower eyelids: Secondary | ICD-10-CM | POA: Diagnosis not present

## 2022-07-08 DIAGNOSIS — G894 Chronic pain syndrome: Secondary | ICD-10-CM | POA: Diagnosis not present

## 2022-07-08 DIAGNOSIS — Z79899 Other long term (current) drug therapy: Secondary | ICD-10-CM | POA: Diagnosis not present

## 2022-07-08 DIAGNOSIS — Z5181 Encounter for therapeutic drug level monitoring: Secondary | ICD-10-CM | POA: Diagnosis not present

## 2022-07-09 ENCOUNTER — Other Ambulatory Visit (HOSPITAL_COMMUNITY): Payer: Self-pay

## 2022-07-09 DIAGNOSIS — I700 Atherosclerosis of aorta: Secondary | ICD-10-CM | POA: Diagnosis not present

## 2022-07-09 DIAGNOSIS — R911 Solitary pulmonary nodule: Secondary | ICD-10-CM | POA: Diagnosis not present

## 2022-07-09 DIAGNOSIS — E7800 Pure hypercholesterolemia, unspecified: Secondary | ICD-10-CM | POA: Diagnosis not present

## 2022-07-09 DIAGNOSIS — Z79899 Other long term (current) drug therapy: Secondary | ICD-10-CM | POA: Diagnosis not present

## 2022-07-09 DIAGNOSIS — F17210 Nicotine dependence, cigarettes, uncomplicated: Secondary | ICD-10-CM | POA: Diagnosis not present

## 2022-07-09 DIAGNOSIS — M1990 Unspecified osteoarthritis, unspecified site: Secondary | ICD-10-CM | POA: Diagnosis not present

## 2022-07-09 DIAGNOSIS — J302 Other seasonal allergic rhinitis: Secondary | ICD-10-CM | POA: Diagnosis not present

## 2022-07-09 MED ORDER — FLUTICASONE PROPIONATE 50 MCG/ACT NA SUSP
2.0000 | Freq: Every day | NASAL | 11 refills | Status: AC
  Filled 2022-07-09: qty 16, 30d supply, fill #0

## 2022-07-09 MED ORDER — PREDNISONE 5 MG PO TABS
5.0000 mg | ORAL_TABLET | Freq: Every day | ORAL | 0 refills | Status: AC | PRN
  Filled 2022-07-09: qty 90, 90d supply, fill #0

## 2022-07-10 ENCOUNTER — Other Ambulatory Visit (HOSPITAL_COMMUNITY): Payer: Self-pay

## 2022-07-16 ENCOUNTER — Other Ambulatory Visit (HOSPITAL_COMMUNITY): Payer: Self-pay

## 2022-07-16 MED ORDER — OXYCODONE HCL 5 MG PO TABS
5.0000 mg | ORAL_TABLET | Freq: Three times a day (TID) | ORAL | 0 refills | Status: DC | PRN
  Filled 2022-07-16: qty 60, 20d supply, fill #0

## 2022-08-01 ENCOUNTER — Other Ambulatory Visit (HOSPITAL_COMMUNITY): Payer: Self-pay

## 2022-08-05 ENCOUNTER — Other Ambulatory Visit (HOSPITAL_COMMUNITY): Payer: Self-pay

## 2022-08-05 MED ORDER — TAMSULOSIN HCL 0.4 MG PO CAPS
0.4000 mg | ORAL_CAPSULE | Freq: Every day | ORAL | 0 refills | Status: AC
  Filled 2022-08-05: qty 30, 30d supply, fill #0

## 2022-08-05 MED ORDER — OXYCODONE HCL 5 MG PO TABS
5.0000 mg | ORAL_TABLET | Freq: Three times a day (TID) | ORAL | 0 refills | Status: AC | PRN
  Filled 2022-08-05: qty 60, 20d supply, fill #0

## 2022-08-15 ENCOUNTER — Other Ambulatory Visit (HOSPITAL_COMMUNITY): Payer: Self-pay

## 2022-08-16 ENCOUNTER — Other Ambulatory Visit (HOSPITAL_COMMUNITY): Payer: Self-pay

## 2022-08-21 ENCOUNTER — Other Ambulatory Visit (HOSPITAL_COMMUNITY): Payer: Self-pay

## 2022-08-23 ENCOUNTER — Other Ambulatory Visit (HOSPITAL_COMMUNITY): Payer: Self-pay

## 2022-08-23 MED ORDER — ATORVASTATIN CALCIUM 80 MG PO TABS
80.0000 mg | ORAL_TABLET | Freq: Every day | ORAL | 3 refills | Status: DC
  Filled 2022-08-23: qty 90, 90d supply, fill #0
  Filled 2022-11-21: qty 90, 90d supply, fill #1
  Filled 2023-01-30 – 2023-02-03 (×2): qty 90, 90d supply, fill #2
  Filled 2023-05-22: qty 90, 90d supply, fill #3

## 2022-09-09 ENCOUNTER — Other Ambulatory Visit (HOSPITAL_COMMUNITY): Payer: Self-pay

## 2022-09-09 MED ORDER — OXYCODONE HCL 5 MG PO TABS
5.0000 mg | ORAL_TABLET | Freq: Three times a day (TID) | ORAL | 0 refills | Status: AC
  Filled 2022-09-09: qty 60, 20d supply, fill #0

## 2022-09-10 ENCOUNTER — Other Ambulatory Visit (HOSPITAL_COMMUNITY): Payer: Self-pay

## 2022-09-19 ENCOUNTER — Other Ambulatory Visit (HOSPITAL_COMMUNITY): Payer: Self-pay

## 2022-09-19 ENCOUNTER — Other Ambulatory Visit: Payer: Self-pay

## 2022-09-19 DIAGNOSIS — Z9889 Other specified postprocedural states: Secondary | ICD-10-CM | POA: Diagnosis not present

## 2022-09-19 DIAGNOSIS — R21 Rash and other nonspecific skin eruption: Secondary | ICD-10-CM | POA: Diagnosis not present

## 2022-09-19 DIAGNOSIS — J441 Chronic obstructive pulmonary disease with (acute) exacerbation: Secondary | ICD-10-CM | POA: Diagnosis not present

## 2022-09-19 DIAGNOSIS — F17200 Nicotine dependence, unspecified, uncomplicated: Secondary | ICD-10-CM | POA: Diagnosis not present

## 2022-09-19 DIAGNOSIS — U071 COVID-19: Secondary | ICD-10-CM | POA: Diagnosis not present

## 2022-09-19 DIAGNOSIS — R051 Acute cough: Secondary | ICD-10-CM | POA: Diagnosis not present

## 2022-09-19 MED ORDER — PAXLOVID (150/100) 10 X 150 MG & 10 X 100MG PO TBPK
ORAL_TABLET | ORAL | 0 refills | Status: AC
  Filled 2022-09-19 (×2): qty 20, 5d supply, fill #0

## 2022-10-08 ENCOUNTER — Other Ambulatory Visit (HOSPITAL_COMMUNITY): Payer: Self-pay

## 2022-10-08 MED ORDER — OXYCODONE HCL 5 MG PO TABS
5.0000 mg | ORAL_TABLET | Freq: Three times a day (TID) | ORAL | 0 refills | Status: AC | PRN
  Filled 2022-10-08: qty 60, 20d supply, fill #0

## 2022-10-09 ENCOUNTER — Other Ambulatory Visit (HOSPITAL_COMMUNITY): Payer: Self-pay

## 2022-10-11 ENCOUNTER — Other Ambulatory Visit (HOSPITAL_COMMUNITY): Payer: Self-pay

## 2022-10-28 ENCOUNTER — Other Ambulatory Visit (HOSPITAL_COMMUNITY): Payer: Self-pay

## 2022-10-28 DIAGNOSIS — N202 Calculus of kidney with calculus of ureter: Secondary | ICD-10-CM | POA: Diagnosis not present

## 2022-10-28 MED ORDER — TAMSULOSIN HCL 0.4 MG PO CAPS
0.4000 mg | ORAL_CAPSULE | ORAL | 3 refills | Status: DC | PRN
  Filled 2022-10-28: qty 90, 90d supply, fill #0
  Filled 2023-02-07: qty 90, 90d supply, fill #1
  Filled 2023-05-12: qty 90, 90d supply, fill #2
  Filled 2023-08-07: qty 90, 90d supply, fill #3

## 2022-10-28 MED ORDER — INDAPAMIDE 1.25 MG PO TABS
1.2500 mg | ORAL_TABLET | Freq: Every day | ORAL | 3 refills | Status: AC
  Filled 2022-10-28: qty 90, 90d supply, fill #0

## 2022-11-06 ENCOUNTER — Other Ambulatory Visit (HOSPITAL_COMMUNITY): Payer: Self-pay

## 2022-11-06 DIAGNOSIS — M25561 Pain in right knee: Secondary | ICD-10-CM | POA: Diagnosis not present

## 2022-11-06 DIAGNOSIS — G894 Chronic pain syndrome: Secondary | ICD-10-CM | POA: Diagnosis not present

## 2022-11-06 MED ORDER — OXYCODONE HCL 5 MG PO TABS
5.0000 mg | ORAL_TABLET | Freq: Three times a day (TID) | ORAL | 0 refills | Status: AC | PRN
  Filled 2022-11-06: qty 60, 20d supply, fill #0

## 2022-11-21 ENCOUNTER — Other Ambulatory Visit (HOSPITAL_COMMUNITY): Payer: Self-pay

## 2022-12-04 ENCOUNTER — Other Ambulatory Visit (HOSPITAL_COMMUNITY): Payer: Self-pay

## 2022-12-04 MED ORDER — OXYCODONE HCL 5 MG PO TABS
5.0000 mg | ORAL_TABLET | Freq: Three times a day (TID) | ORAL | 0 refills | Status: AC | PRN
  Filled 2022-12-04: qty 60, 20d supply, fill #0

## 2022-12-10 ENCOUNTER — Other Ambulatory Visit (HOSPITAL_COMMUNITY): Payer: Self-pay

## 2023-01-02 ENCOUNTER — Other Ambulatory Visit (HOSPITAL_COMMUNITY): Payer: Self-pay

## 2023-01-02 MED ORDER — OXYCODONE HCL 5 MG PO TABS
5.0000 mg | ORAL_TABLET | Freq: Three times a day (TID) | ORAL | 0 refills | Status: AC | PRN
  Filled 2023-01-02: qty 60, 20d supply, fill #0

## 2023-01-15 ENCOUNTER — Other Ambulatory Visit (HOSPITAL_COMMUNITY): Payer: Self-pay

## 2023-01-15 MED ORDER — PREDNISONE 20 MG PO TABS
10.0000 mg | ORAL_TABLET | Freq: Every day | ORAL | 0 refills | Status: DC | PRN
  Filled 2023-01-15: qty 15, 30d supply, fill #0

## 2023-01-16 ENCOUNTER — Other Ambulatory Visit (HOSPITAL_COMMUNITY): Payer: Self-pay

## 2023-01-30 ENCOUNTER — Other Ambulatory Visit (HOSPITAL_COMMUNITY): Payer: Self-pay

## 2023-01-31 ENCOUNTER — Other Ambulatory Visit (HOSPITAL_COMMUNITY): Payer: Self-pay

## 2023-01-31 MED ORDER — OXYCODONE HCL 5 MG PO TABS
5.0000 mg | ORAL_TABLET | Freq: Three times a day (TID) | ORAL | 0 refills | Status: AC | PRN
Start: 2023-01-31 — End: ?
  Filled 2023-01-31: qty 60, 20d supply, fill #0

## 2023-02-07 ENCOUNTER — Other Ambulatory Visit (HOSPITAL_COMMUNITY): Payer: Self-pay

## 2023-02-18 ENCOUNTER — Other Ambulatory Visit (HOSPITAL_COMMUNITY): Payer: Self-pay

## 2023-02-20 ENCOUNTER — Other Ambulatory Visit (HOSPITAL_COMMUNITY): Payer: Self-pay

## 2023-02-20 MED ORDER — PREDNISONE 20 MG PO TABS
10.0000 mg | ORAL_TABLET | Freq: Every day | ORAL | 0 refills | Status: AC | PRN
  Filled 2023-02-20: qty 15, 30d supply, fill #0

## 2023-02-21 ENCOUNTER — Other Ambulatory Visit (HOSPITAL_COMMUNITY): Payer: Self-pay

## 2023-02-26 ENCOUNTER — Other Ambulatory Visit (HOSPITAL_COMMUNITY): Payer: Self-pay

## 2023-02-26 MED ORDER — OXYCODONE HCL 5 MG PO TABS
5.0000 mg | ORAL_TABLET | Freq: Three times a day (TID) | ORAL | 0 refills | Status: AC | PRN
Start: 2023-02-26 — End: ?
  Filled 2023-02-26: qty 60, 20d supply, fill #0

## 2023-03-10 ENCOUNTER — Other Ambulatory Visit (HOSPITAL_COMMUNITY): Payer: Self-pay

## 2023-03-10 DIAGNOSIS — Z03818 Encounter for observation for suspected exposure to other biological agents ruled out: Secondary | ICD-10-CM | POA: Diagnosis not present

## 2023-03-10 DIAGNOSIS — J069 Acute upper respiratory infection, unspecified: Secondary | ICD-10-CM | POA: Diagnosis not present

## 2023-03-10 DIAGNOSIS — R051 Acute cough: Secondary | ICD-10-CM | POA: Diagnosis not present

## 2023-03-10 MED ORDER — AMOXICILLIN-POT CLAVULANATE 875-125 MG PO TABS
1.0000 | ORAL_TABLET | Freq: Two times a day (BID) | ORAL | 0 refills | Status: AC
  Filled 2023-03-10: qty 14, 7d supply, fill #0

## 2023-03-12 ENCOUNTER — Other Ambulatory Visit (HOSPITAL_COMMUNITY): Payer: Self-pay

## 2023-03-12 DIAGNOSIS — G894 Chronic pain syndrome: Secondary | ICD-10-CM | POA: Diagnosis not present

## 2023-03-12 DIAGNOSIS — M25561 Pain in right knee: Secondary | ICD-10-CM | POA: Diagnosis not present

## 2023-03-12 MED ORDER — OXYCODONE HCL 5 MG PO TABS
5.0000 mg | ORAL_TABLET | Freq: Three times a day (TID) | ORAL | 0 refills | Status: DC | PRN
Start: 2023-03-17 — End: 2023-04-10
  Filled 2023-03-17: qty 90, 30d supply, fill #0

## 2023-03-13 ENCOUNTER — Other Ambulatory Visit (HOSPITAL_COMMUNITY): Payer: Self-pay

## 2023-03-17 ENCOUNTER — Other Ambulatory Visit (HOSPITAL_COMMUNITY): Payer: Self-pay

## 2023-04-03 ENCOUNTER — Other Ambulatory Visit: Payer: Self-pay

## 2023-04-03 ENCOUNTER — Other Ambulatory Visit (HOSPITAL_COMMUNITY): Payer: Self-pay

## 2023-04-03 DIAGNOSIS — Z0000 Encounter for general adult medical examination without abnormal findings: Secondary | ICD-10-CM | POA: Diagnosis not present

## 2023-04-03 DIAGNOSIS — Z125 Encounter for screening for malignant neoplasm of prostate: Secondary | ICD-10-CM | POA: Diagnosis not present

## 2023-04-03 DIAGNOSIS — E7800 Pure hypercholesterolemia, unspecified: Secondary | ICD-10-CM | POA: Diagnosis not present

## 2023-04-03 DIAGNOSIS — I10 Essential (primary) hypertension: Secondary | ICD-10-CM | POA: Diagnosis not present

## 2023-04-03 DIAGNOSIS — Z79899 Other long term (current) drug therapy: Secondary | ICD-10-CM | POA: Diagnosis not present

## 2023-04-03 DIAGNOSIS — M1990 Unspecified osteoarthritis, unspecified site: Secondary | ICD-10-CM | POA: Diagnosis not present

## 2023-04-03 DIAGNOSIS — J302 Other seasonal allergic rhinitis: Secondary | ICD-10-CM | POA: Diagnosis not present

## 2023-04-03 DIAGNOSIS — Z902 Acquired absence of lung [part of]: Secondary | ICD-10-CM | POA: Diagnosis not present

## 2023-04-03 DIAGNOSIS — F17210 Nicotine dependence, cigarettes, uncomplicated: Secondary | ICD-10-CM | POA: Diagnosis not present

## 2023-04-03 DIAGNOSIS — I700 Atherosclerosis of aorta: Secondary | ICD-10-CM | POA: Diagnosis not present

## 2023-04-03 MED ORDER — PREDNISONE 1 MG PO TABS
ORAL_TABLET | ORAL | 0 refills | Status: AC
  Filled 2023-04-03: qty 98, 60d supply, fill #0

## 2023-04-03 MED ORDER — CLOTRIMAZOLE-BETAMETHASONE 1-0.05 % EX CREA
1.0000 | TOPICAL_CREAM | Freq: Two times a day (BID) | CUTANEOUS | 3 refills | Status: AC
Start: 2023-04-03 — End: ?
  Filled 2023-04-03: qty 90, 30d supply, fill #0
  Filled 2023-09-22: qty 90, 30d supply, fill #1
  Filled 2023-12-03 (×2): qty 90, 30d supply, fill #2
  Filled 2023-12-30: qty 90, 30d supply, fill #3

## 2023-04-04 ENCOUNTER — Other Ambulatory Visit (HOSPITAL_COMMUNITY): Payer: Self-pay

## 2023-04-07 ENCOUNTER — Other Ambulatory Visit (HOSPITAL_COMMUNITY): Payer: Self-pay | Admitting: Internal Medicine

## 2023-04-07 DIAGNOSIS — R011 Cardiac murmur, unspecified: Secondary | ICD-10-CM

## 2023-04-10 ENCOUNTER — Other Ambulatory Visit (HOSPITAL_COMMUNITY): Payer: Self-pay

## 2023-04-10 MED ORDER — OXYCODONE HCL 5 MG PO TABS
5.0000 mg | ORAL_TABLET | Freq: Three times a day (TID) | ORAL | 0 refills | Status: DC | PRN
Start: 2023-04-10 — End: 2023-05-13
  Filled 2023-04-10 – 2023-04-14 (×2): qty 90, 30d supply, fill #0

## 2023-04-14 ENCOUNTER — Other Ambulatory Visit (HOSPITAL_COMMUNITY): Payer: Self-pay

## 2023-04-24 DIAGNOSIS — M25562 Pain in left knee: Secondary | ICD-10-CM | POA: Diagnosis not present

## 2023-04-24 DIAGNOSIS — M1711 Unilateral primary osteoarthritis, right knee: Secondary | ICD-10-CM | POA: Diagnosis not present

## 2023-05-06 DIAGNOSIS — M170 Bilateral primary osteoarthritis of knee: Secondary | ICD-10-CM | POA: Diagnosis not present

## 2023-05-08 DIAGNOSIS — M8588 Other specified disorders of bone density and structure, other site: Secondary | ICD-10-CM | POA: Diagnosis not present

## 2023-05-08 DIAGNOSIS — Z7952 Long term (current) use of systemic steroids: Secondary | ICD-10-CM | POA: Diagnosis not present

## 2023-05-09 ENCOUNTER — Ambulatory Visit (HOSPITAL_COMMUNITY): Payer: Commercial Managed Care - PPO | Attending: Cardiology

## 2023-05-09 DIAGNOSIS — R011 Cardiac murmur, unspecified: Secondary | ICD-10-CM | POA: Insufficient documentation

## 2023-05-09 LAB — ECHOCARDIOGRAM COMPLETE
AR max vel: 1.76 cm2
AV Area VTI: 1.58 cm2
AV Area mean vel: 1.72 cm2
AV Mean grad: 14.5 mm[Hg]
AV Peak grad: 27 mm[Hg]
Ao pk vel: 2.6 m/s
Area-P 1/2: 3.03 cm2
Calc EF: 55.8 %
S' Lateral: 2.5 cm
Single Plane A2C EF: 55.1 %
Single Plane A4C EF: 55.5 %

## 2023-05-12 ENCOUNTER — Other Ambulatory Visit (HOSPITAL_COMMUNITY): Payer: Self-pay

## 2023-05-13 ENCOUNTER — Encounter (HOSPITAL_COMMUNITY): Payer: Self-pay

## 2023-05-13 ENCOUNTER — Other Ambulatory Visit (HOSPITAL_COMMUNITY): Payer: Self-pay

## 2023-05-13 MED ORDER — OXYCODONE HCL 5 MG PO TABS
5.0000 mg | ORAL_TABLET | Freq: Three times a day (TID) | ORAL | 0 refills | Status: DC | PRN
  Filled 2023-05-13 – 2023-05-14 (×3): qty 90, 30d supply, fill #0

## 2023-05-14 ENCOUNTER — Other Ambulatory Visit (HOSPITAL_COMMUNITY): Payer: Self-pay

## 2023-05-22 ENCOUNTER — Other Ambulatory Visit (HOSPITAL_COMMUNITY): Payer: Self-pay

## 2023-06-05 ENCOUNTER — Other Ambulatory Visit (HOSPITAL_COMMUNITY): Payer: Self-pay

## 2023-06-05 DIAGNOSIS — I10 Essential (primary) hypertension: Secondary | ICD-10-CM | POA: Diagnosis not present

## 2023-06-05 DIAGNOSIS — Z7952 Long term (current) use of systemic steroids: Secondary | ICD-10-CM | POA: Diagnosis not present

## 2023-06-05 DIAGNOSIS — I77810 Thoracic aortic ectasia: Secondary | ICD-10-CM | POA: Diagnosis not present

## 2023-06-05 DIAGNOSIS — M1990 Unspecified osteoarthritis, unspecified site: Secondary | ICD-10-CM | POA: Diagnosis not present

## 2023-06-05 DIAGNOSIS — I350 Nonrheumatic aortic (valve) stenosis: Secondary | ICD-10-CM | POA: Diagnosis not present

## 2023-06-05 DIAGNOSIS — F17210 Nicotine dependence, cigarettes, uncomplicated: Secondary | ICD-10-CM | POA: Diagnosis not present

## 2023-06-05 DIAGNOSIS — G8929 Other chronic pain: Secondary | ICD-10-CM | POA: Diagnosis not present

## 2023-06-05 DIAGNOSIS — R011 Cardiac murmur, unspecified: Secondary | ICD-10-CM | POA: Diagnosis not present

## 2023-06-05 MED ORDER — PREDNISONE 1 MG PO TABS
ORAL_TABLET | ORAL | 0 refills | Status: AC
  Filled 2023-06-05: qty 100, 60d supply, fill #0

## 2023-06-13 ENCOUNTER — Other Ambulatory Visit (HOSPITAL_COMMUNITY): Payer: Self-pay

## 2023-06-13 MED ORDER — OXYCODONE HCL 5 MG PO TABS
5.0000 mg | ORAL_TABLET | Freq: Three times a day (TID) | ORAL | 0 refills | Status: DC | PRN
  Filled 2023-06-13: qty 90, 30d supply, fill #0

## 2023-07-08 DIAGNOSIS — H5203 Hypermetropia, bilateral: Secondary | ICD-10-CM | POA: Diagnosis not present

## 2023-07-08 DIAGNOSIS — H52223 Regular astigmatism, bilateral: Secondary | ICD-10-CM | POA: Diagnosis not present

## 2023-07-08 DIAGNOSIS — H524 Presbyopia: Secondary | ICD-10-CM | POA: Diagnosis not present

## 2023-07-10 ENCOUNTER — Other Ambulatory Visit (HOSPITAL_COMMUNITY): Payer: Self-pay

## 2023-07-10 DIAGNOSIS — M170 Bilateral primary osteoarthritis of knee: Secondary | ICD-10-CM | POA: Diagnosis not present

## 2023-07-10 DIAGNOSIS — Z5181 Encounter for therapeutic drug level monitoring: Secondary | ICD-10-CM | POA: Diagnosis not present

## 2023-07-10 DIAGNOSIS — Z79899 Other long term (current) drug therapy: Secondary | ICD-10-CM | POA: Diagnosis not present

## 2023-07-10 MED ORDER — OXYCODONE HCL 5 MG PO TABS
5.0000 mg | ORAL_TABLET | Freq: Three times a day (TID) | ORAL | 0 refills | Status: DC | PRN
  Filled 2023-07-11: qty 90, 30d supply, fill #0

## 2023-07-11 ENCOUNTER — Other Ambulatory Visit (HOSPITAL_COMMUNITY): Payer: Self-pay

## 2023-07-23 DIAGNOSIS — M170 Bilateral primary osteoarthritis of knee: Secondary | ICD-10-CM | POA: Diagnosis not present

## 2023-08-07 ENCOUNTER — Other Ambulatory Visit (HOSPITAL_COMMUNITY): Payer: Self-pay

## 2023-08-08 ENCOUNTER — Other Ambulatory Visit (HOSPITAL_COMMUNITY): Payer: Self-pay

## 2023-08-08 MED ORDER — OXYCODONE HCL 5 MG PO TABS
5.0000 mg | ORAL_TABLET | Freq: Three times a day (TID) | ORAL | 0 refills | Status: AC | PRN
  Filled 2023-08-08: qty 90, 30d supply, fill #0

## 2023-08-09 ENCOUNTER — Other Ambulatory Visit (HOSPITAL_COMMUNITY): Payer: Self-pay

## 2023-08-09 MED ORDER — OXYCODONE HCL 5 MG PO TABS
5.0000 mg | ORAL_TABLET | Freq: Three times a day (TID) | ORAL | 0 refills | Status: AC | PRN
  Filled 2023-08-09: qty 90, 30d supply, fill #0

## 2023-08-11 ENCOUNTER — Other Ambulatory Visit (HOSPITAL_COMMUNITY): Payer: Self-pay

## 2023-08-29 ENCOUNTER — Other Ambulatory Visit (HOSPITAL_COMMUNITY): Payer: Self-pay

## 2023-08-29 DIAGNOSIS — M436 Torticollis: Secondary | ICD-10-CM | POA: Diagnosis not present

## 2023-08-29 MED ORDER — DICLOFENAC SODIUM 1 % EX GEL
Freq: Four times a day (QID) | CUTANEOUS | 0 refills | Status: AC
Start: 2023-08-29 — End: ?
  Filled 2023-08-29: qty 100, 30d supply, fill #0

## 2023-08-29 MED ORDER — TIZANIDINE HCL 4 MG PO TABS
4.0000 mg | ORAL_TABLET | Freq: Four times a day (QID) | ORAL | 0 refills | Status: AC | PRN
  Filled 2023-08-29: qty 30, 8d supply, fill #0

## 2023-09-06 ENCOUNTER — Other Ambulatory Visit (HOSPITAL_COMMUNITY): Payer: Self-pay

## 2023-09-06 MED ORDER — OXYCODONE HCL 5 MG PO TABS
5.0000 mg | ORAL_TABLET | Freq: Three times a day (TID) | ORAL | 0 refills | Status: AC | PRN
  Filled 2023-09-08: qty 90, 30d supply, fill #0

## 2023-09-08 ENCOUNTER — Other Ambulatory Visit (HOSPITAL_COMMUNITY): Payer: Self-pay

## 2023-09-22 ENCOUNTER — Other Ambulatory Visit (HOSPITAL_COMMUNITY): Payer: Self-pay

## 2023-10-06 ENCOUNTER — Other Ambulatory Visit (HOSPITAL_COMMUNITY): Payer: Self-pay

## 2023-10-06 MED ORDER — OXYCODONE HCL 5 MG PO TABS
5.0000 mg | ORAL_TABLET | Freq: Three times a day (TID) | ORAL | 0 refills | Status: AC | PRN
  Filled 2023-10-06: qty 90, 30d supply, fill #0

## 2023-11-04 ENCOUNTER — Other Ambulatory Visit (HOSPITAL_COMMUNITY): Payer: Self-pay

## 2023-11-04 DIAGNOSIS — N202 Calculus of kidney with calculus of ureter: Secondary | ICD-10-CM | POA: Diagnosis not present

## 2023-11-04 DIAGNOSIS — Z125 Encounter for screening for malignant neoplasm of prostate: Secondary | ICD-10-CM | POA: Diagnosis not present

## 2023-11-04 MED ORDER — TAMSULOSIN HCL 0.4 MG PO CAPS
0.4000 mg | ORAL_CAPSULE | Freq: Every day | ORAL | 3 refills | Status: AC | PRN
  Filled 2023-11-04: qty 90, 90d supply, fill #0
  Filled 2024-02-02: qty 90, 90d supply, fill #1
  Filled 2024-04-21: qty 90, 90d supply, fill #0

## 2023-11-04 MED ORDER — OXYCODONE HCL 5 MG PO TABS
5.0000 mg | ORAL_TABLET | Freq: Three times a day (TID) | ORAL | 0 refills | Status: AC | PRN
  Filled 2023-11-04: qty 90, 30d supply, fill #0

## 2023-11-04 MED ORDER — INDAPAMIDE 1.25 MG PO TABS
1.2500 mg | ORAL_TABLET | Freq: Every day | ORAL | 3 refills | Status: AC
  Filled 2023-11-04 – 2024-04-21 (×2): qty 90, 90d supply, fill #0

## 2023-11-05 ENCOUNTER — Other Ambulatory Visit (HOSPITAL_COMMUNITY): Payer: Self-pay

## 2023-11-10 ENCOUNTER — Other Ambulatory Visit (HOSPITAL_COMMUNITY): Payer: Self-pay

## 2023-11-10 DIAGNOSIS — J019 Acute sinusitis, unspecified: Secondary | ICD-10-CM | POA: Diagnosis not present

## 2023-11-10 DIAGNOSIS — R051 Acute cough: Secondary | ICD-10-CM | POA: Diagnosis not present

## 2023-11-10 MED ORDER — PREDNISONE 20 MG PO TABS
40.0000 mg | ORAL_TABLET | Freq: Every day | ORAL | 0 refills | Status: AC
  Filled 2023-11-10: qty 10, 5d supply, fill #0

## 2023-11-10 MED ORDER — AMOXICILLIN-POT CLAVULANATE 875-125 MG PO TABS
1.0000 | ORAL_TABLET | Freq: Two times a day (BID) | ORAL | 0 refills | Status: AC
  Filled 2023-11-10: qty 20, 10d supply, fill #0

## 2023-11-13 DIAGNOSIS — K5900 Constipation, unspecified: Secondary | ICD-10-CM | POA: Diagnosis not present

## 2023-11-17 ENCOUNTER — Other Ambulatory Visit (HOSPITAL_COMMUNITY): Payer: Self-pay

## 2023-11-17 DIAGNOSIS — M25561 Pain in right knee: Secondary | ICD-10-CM | POA: Diagnosis not present

## 2023-11-17 DIAGNOSIS — M25562 Pain in left knee: Secondary | ICD-10-CM | POA: Diagnosis not present

## 2023-11-17 MED ORDER — OXYCODONE HCL 5 MG PO TABS
5.0000 mg | ORAL_TABLET | Freq: Three times a day (TID) | ORAL | 0 refills | Status: DC | PRN
  Filled 2023-11-17 – 2023-12-03 (×2): qty 90, 30d supply, fill #0

## 2023-11-18 ENCOUNTER — Other Ambulatory Visit (HOSPITAL_COMMUNITY): Payer: Self-pay

## 2023-11-30 DIAGNOSIS — R102 Pelvic and perineal pain: Secondary | ICD-10-CM | POA: Diagnosis not present

## 2023-11-30 DIAGNOSIS — R300 Dysuria: Secondary | ICD-10-CM | POA: Diagnosis not present

## 2023-11-30 DIAGNOSIS — R109 Unspecified abdominal pain: Secondary | ICD-10-CM | POA: Diagnosis not present

## 2023-12-02 ENCOUNTER — Other Ambulatory Visit (HOSPITAL_COMMUNITY): Payer: Self-pay

## 2023-12-03 ENCOUNTER — Other Ambulatory Visit (HOSPITAL_COMMUNITY): Payer: Self-pay

## 2023-12-03 MED ORDER — ATORVASTATIN CALCIUM 80 MG PO TABS
80.0000 mg | ORAL_TABLET | Freq: Every day | ORAL | 3 refills | Status: AC
  Filled 2023-12-03 – 2024-04-21 (×3): qty 90, 90d supply, fill #0

## 2023-12-04 ENCOUNTER — Other Ambulatory Visit (HOSPITAL_COMMUNITY): Payer: Self-pay

## 2023-12-04 ENCOUNTER — Other Ambulatory Visit: Payer: Self-pay

## 2023-12-30 ENCOUNTER — Other Ambulatory Visit (HOSPITAL_COMMUNITY): Payer: Self-pay

## 2023-12-30 MED ORDER — OXYCODONE HCL 5 MG PO TABS
5.0000 mg | ORAL_TABLET | Freq: Three times a day (TID) | ORAL | 0 refills | Status: AC | PRN
  Filled 2023-12-30 – 2024-05-28 (×2): qty 90, 30d supply, fill #0
  Filled ????-??-?? (×3): fill #0

## 2023-12-30 MED ORDER — OXYCODONE HCL 5 MG PO TABS
5.0000 mg | ORAL_TABLET | Freq: Three times a day (TID) | ORAL | 0 refills | Status: AC | PRN
  Filled 2023-12-31: qty 90, 30d supply, fill #0

## 2023-12-31 ENCOUNTER — Other Ambulatory Visit (HOSPITAL_COMMUNITY): Payer: Self-pay

## 2024-02-01 ENCOUNTER — Other Ambulatory Visit (HOSPITAL_COMMUNITY): Payer: Self-pay

## 2024-02-01 MED ORDER — OXYCODONE HCL 5 MG PO TABS
5.0000 mg | ORAL_TABLET | Freq: Three times a day (TID) | ORAL | 0 refills | Status: AC | PRN
  Filled 2024-02-01: qty 90, 30d supply, fill #0

## 2024-02-02 ENCOUNTER — Other Ambulatory Visit (HOSPITAL_COMMUNITY): Payer: Self-pay

## 2024-02-03 DIAGNOSIS — R5383 Other fatigue: Secondary | ICD-10-CM | POA: Diagnosis not present

## 2024-02-03 DIAGNOSIS — R030 Elevated blood-pressure reading, without diagnosis of hypertension: Secondary | ICD-10-CM | POA: Diagnosis not present

## 2024-02-11 ENCOUNTER — Other Ambulatory Visit (HOSPITAL_COMMUNITY): Payer: Self-pay

## 2024-02-13 ENCOUNTER — Other Ambulatory Visit (HOSPITAL_COMMUNITY): Payer: Self-pay

## 2024-02-17 ENCOUNTER — Other Ambulatory Visit (HOSPITAL_COMMUNITY): Payer: Self-pay

## 2024-02-24 DIAGNOSIS — M170 Bilateral primary osteoarthritis of knee: Secondary | ICD-10-CM | POA: Diagnosis not present

## 2024-03-01 ENCOUNTER — Other Ambulatory Visit (HOSPITAL_COMMUNITY): Payer: Self-pay

## 2024-03-01 MED ORDER — OXYCODONE HCL 5 MG PO TABS
5.0000 mg | ORAL_TABLET | Freq: Three times a day (TID) | ORAL | 0 refills | Status: AC | PRN
  Filled 2024-03-01 – 2024-03-02 (×2): qty 90, 30d supply, fill #0

## 2024-03-02 ENCOUNTER — Other Ambulatory Visit (HOSPITAL_COMMUNITY): Payer: Self-pay

## 2024-03-08 DIAGNOSIS — R52 Pain, unspecified: Secondary | ICD-10-CM | POA: Diagnosis not present

## 2024-03-08 DIAGNOSIS — G894 Chronic pain syndrome: Secondary | ICD-10-CM | POA: Diagnosis not present

## 2024-03-08 DIAGNOSIS — M170 Bilateral primary osteoarthritis of knee: Secondary | ICD-10-CM | POA: Diagnosis not present

## 2024-03-29 ENCOUNTER — Other Ambulatory Visit (HOSPITAL_COMMUNITY): Payer: Self-pay

## 2024-03-29 MED ORDER — OXYCODONE HCL 5 MG PO TABS
5.0000 mg | ORAL_TABLET | Freq: Three times a day (TID) | ORAL | 0 refills | Status: AC | PRN
  Filled 2024-03-30 (×2): qty 90, 30d supply, fill #0

## 2024-03-30 ENCOUNTER — Other Ambulatory Visit: Payer: Self-pay

## 2024-03-30 ENCOUNTER — Other Ambulatory Visit (HOSPITAL_COMMUNITY): Payer: Self-pay

## 2024-03-31 DIAGNOSIS — M65322 Trigger finger, left index finger: Secondary | ICD-10-CM | POA: Diagnosis not present

## 2024-03-31 DIAGNOSIS — M65311 Trigger thumb, right thumb: Secondary | ICD-10-CM | POA: Diagnosis not present

## 2024-03-31 DIAGNOSIS — G5603 Carpal tunnel syndrome, bilateral upper limbs: Secondary | ICD-10-CM | POA: Diagnosis not present

## 2024-04-05 DIAGNOSIS — W19XXXA Unspecified fall, initial encounter: Secondary | ICD-10-CM | POA: Diagnosis not present

## 2024-04-05 DIAGNOSIS — R0789 Other chest pain: Secondary | ICD-10-CM | POA: Diagnosis not present

## 2024-04-05 DIAGNOSIS — M25531 Pain in right wrist: Secondary | ICD-10-CM | POA: Diagnosis not present

## 2024-04-07 DIAGNOSIS — M25531 Pain in right wrist: Secondary | ICD-10-CM | POA: Diagnosis not present

## 2024-04-21 ENCOUNTER — Other Ambulatory Visit (HOSPITAL_COMMUNITY): Payer: Self-pay

## 2024-04-21 ENCOUNTER — Other Ambulatory Visit: Payer: Self-pay

## 2024-04-27 ENCOUNTER — Other Ambulatory Visit (HOSPITAL_COMMUNITY): Payer: Self-pay

## 2024-04-27 MED ORDER — OXYCODONE HCL 5 MG PO TABS
5.0000 mg | ORAL_TABLET | Freq: Three times a day (TID) | ORAL | 0 refills | Status: AC | PRN
  Filled 2024-04-27 – 2024-04-29 (×3): qty 90, 30d supply, fill #0

## 2024-04-29 ENCOUNTER — Other Ambulatory Visit (HOSPITAL_COMMUNITY): Payer: Self-pay

## 2024-05-26 ENCOUNTER — Other Ambulatory Visit (HOSPITAL_COMMUNITY): Payer: Self-pay

## 2024-05-26 MED ORDER — OXYCODONE HCL 5 MG PO TABS: 5.0000 mg | ORAL_TABLET | Freq: Three times a day (TID) | ORAL | 0 refills | Status: AC | PRN

## 2024-05-27 ENCOUNTER — Other Ambulatory Visit (HOSPITAL_COMMUNITY): Payer: Self-pay

## 2024-05-28 ENCOUNTER — Other Ambulatory Visit (HOSPITAL_COMMUNITY): Payer: Self-pay
